# Patient Record
Sex: Female | Born: 1935 | Race: White | Hispanic: No | Smoking: Never smoker
Health system: Southern US, Community
[De-identification: ages and names within clinical notes are randomized; demographics above are authoritative.]

## PROBLEM LIST (undated history)

## (undated) DIAGNOSIS — I2510 Atherosclerotic heart disease of native coronary artery without angina pectoris: Secondary | ICD-10-CM

## (undated) DIAGNOSIS — R0602 Shortness of breath: Secondary | ICD-10-CM

## (undated) DIAGNOSIS — E079 Disorder of thyroid, unspecified: Secondary | ICD-10-CM

## (undated) DIAGNOSIS — Z981 Arthrodesis status: Secondary | ICD-10-CM

## (undated) DIAGNOSIS — I779 Disorder of arteries and arterioles, unspecified: Secondary | ICD-10-CM

## (undated) DIAGNOSIS — D735 Infarction of spleen: Secondary | ICD-10-CM

## (undated) DIAGNOSIS — I4891 Unspecified atrial fibrillation: Secondary | ICD-10-CM

## (undated) DIAGNOSIS — Z9049 Acquired absence of other specified parts of digestive tract: Secondary | ICD-10-CM

## (undated) DIAGNOSIS — R011 Cardiac murmur, unspecified: Secondary | ICD-10-CM

## (undated) DIAGNOSIS — R58 Hemorrhage, not elsewhere classified: Secondary | ICD-10-CM

## (undated) DIAGNOSIS — I10 Essential (primary) hypertension: Secondary | ICD-10-CM

## (undated) DIAGNOSIS — R001 Bradycardia, unspecified: Secondary | ICD-10-CM

## (undated) DIAGNOSIS — D239 Other benign neoplasm of skin, unspecified: Secondary | ICD-10-CM

## (undated) DIAGNOSIS — E782 Mixed hyperlipidemia: Secondary | ICD-10-CM

## (undated) DIAGNOSIS — IMO0002 Reserved for concepts with insufficient information to code with codable children: Secondary | ICD-10-CM

## (undated) DIAGNOSIS — I739 Peripheral vascular disease, unspecified: Secondary | ICD-10-CM

## (undated) DIAGNOSIS — Z90710 Acquired absence of both cervix and uterus: Secondary | ICD-10-CM

## (undated) DIAGNOSIS — K219 Gastro-esophageal reflux disease without esophagitis: Secondary | ICD-10-CM

## (undated) DIAGNOSIS — J449 Chronic obstructive pulmonary disease, unspecified: Secondary | ICD-10-CM

## (undated) DIAGNOSIS — I639 Cerebral infarction, unspecified: Secondary | ICD-10-CM

## (undated) DIAGNOSIS — Z7901 Long term (current) use of anticoagulants: Secondary | ICD-10-CM

## (undated) DIAGNOSIS — K449 Diaphragmatic hernia without obstruction or gangrene: Secondary | ICD-10-CM

## (undated) DIAGNOSIS — I480 Paroxysmal atrial fibrillation: Secondary | ICD-10-CM

## (undated) DIAGNOSIS — M1990 Unspecified osteoarthritis, unspecified site: Secondary | ICD-10-CM

## (undated) HISTORY — PX: CHOLECYSTECTOMY: SHX55

## (undated) HISTORY — PX: BLADDER SUSPENSION: SHX72

## (undated) HISTORY — DX: Disorder of arteries and arterioles, unspecified: I77.9

## (undated) HISTORY — DX: Peripheral vascular disease, unspecified: I73.9

## (undated) HISTORY — DX: Unspecified osteoarthritis, unspecified site: M19.90

## (undated) HISTORY — DX: Cardiac murmur, unspecified: R01.1

## (undated) HISTORY — PX: CORONARY ARTERY BYPASS GRAFT: SHX141

## (undated) HISTORY — PX: CARDIAC CATHETERIZATION: SHX172

## (undated) HISTORY — PX: CATARACT EXTRACTION W/ INTRAOCULAR LENS  IMPLANT, BILATERAL: SHX1307

## (undated) HISTORY — PX: BACK SURGERY: SHX140

## (undated) HISTORY — PX: EYE SURGERY: SHX253

## (undated) HISTORY — DX: Mixed hyperlipidemia: E78.2

## (undated) HISTORY — DX: Disorder of thyroid, unspecified: E07.9

## (undated) SURGERY — ECHOCARDIOGRAM, TRANSESOPHAGEAL
Anesthesia: Moderate Sedation

## (undated) SURGERY — COLONOSCOPY
Anesthesia: Moderate Sedation

---

## 1972-04-12 HISTORY — PX: ABDOMINAL HYSTERECTOMY: SHX81

## 1998-09-26 ENCOUNTER — Ambulatory Visit (HOSPITAL_COMMUNITY): Admission: RE | Admit: 1998-09-26 | Discharge: 1998-09-26 | Payer: Self-pay | Admitting: Cardiovascular Disease

## 1998-09-26 ENCOUNTER — Encounter: Payer: Self-pay | Admitting: Cardiovascular Disease

## 1998-11-01 ENCOUNTER — Encounter: Payer: Self-pay | Admitting: Emergency Medicine

## 1998-11-01 ENCOUNTER — Emergency Department (HOSPITAL_COMMUNITY): Admission: EM | Admit: 1998-11-01 | Discharge: 1998-11-01 | Payer: Self-pay | Admitting: Emergency Medicine

## 1998-12-04 ENCOUNTER — Inpatient Hospital Stay (HOSPITAL_COMMUNITY): Admission: RE | Admit: 1998-12-04 | Discharge: 1998-12-05 | Payer: Self-pay | Admitting: Cardiovascular Disease

## 1998-12-04 ENCOUNTER — Encounter: Payer: Self-pay | Admitting: Cardiovascular Disease

## 1999-03-09 ENCOUNTER — Encounter: Payer: Self-pay | Admitting: Cardiovascular Disease

## 1999-03-09 ENCOUNTER — Inpatient Hospital Stay (HOSPITAL_COMMUNITY): Admission: EM | Admit: 1999-03-09 | Discharge: 1999-03-11 | Payer: Self-pay | Admitting: Emergency Medicine

## 1999-03-27 ENCOUNTER — Ambulatory Visit (HOSPITAL_COMMUNITY): Admission: RE | Admit: 1999-03-27 | Discharge: 1999-03-27 | Payer: Self-pay | Admitting: Cardiovascular Disease

## 1999-03-27 ENCOUNTER — Encounter: Payer: Self-pay | Admitting: Cardiovascular Disease

## 1999-05-22 ENCOUNTER — Encounter: Payer: Self-pay | Admitting: Gastroenterology

## 1999-05-22 ENCOUNTER — Ambulatory Visit (HOSPITAL_COMMUNITY): Admission: RE | Admit: 1999-05-22 | Discharge: 1999-05-22 | Payer: Self-pay | Admitting: Gastroenterology

## 2000-02-09 ENCOUNTER — Encounter: Admission: RE | Admit: 2000-02-09 | Discharge: 2000-02-09 | Payer: Self-pay | Admitting: Gastroenterology

## 2000-02-09 ENCOUNTER — Encounter: Payer: Self-pay | Admitting: Gastroenterology

## 2000-06-27 ENCOUNTER — Ambulatory Visit (HOSPITAL_COMMUNITY): Admission: RE | Admit: 2000-06-27 | Discharge: 2000-06-27 | Payer: Self-pay | Admitting: Gastroenterology

## 2000-10-05 ENCOUNTER — Other Ambulatory Visit: Admission: RE | Admit: 2000-10-05 | Discharge: 2000-10-05 | Payer: Self-pay | Admitting: *Deleted

## 2001-03-21 ENCOUNTER — Ambulatory Visit (HOSPITAL_COMMUNITY): Admission: RE | Admit: 2001-03-21 | Discharge: 2001-03-21 | Payer: Self-pay | Admitting: Ophthalmology

## 2001-04-11 ENCOUNTER — Ambulatory Visit (HOSPITAL_COMMUNITY): Admission: RE | Admit: 2001-04-11 | Discharge: 2001-04-11 | Payer: Self-pay | Admitting: Ophthalmology

## 2001-05-31 ENCOUNTER — Ambulatory Visit (HOSPITAL_COMMUNITY): Admission: RE | Admit: 2001-05-31 | Discharge: 2001-05-31 | Payer: Self-pay | Admitting: Family Medicine

## 2001-05-31 ENCOUNTER — Encounter: Payer: Self-pay | Admitting: Family Medicine

## 2001-06-09 ENCOUNTER — Ambulatory Visit (HOSPITAL_COMMUNITY): Admission: RE | Admit: 2001-06-09 | Discharge: 2001-06-09 | Payer: Self-pay | Admitting: Family Medicine

## 2001-06-09 ENCOUNTER — Encounter: Payer: Self-pay | Admitting: Family Medicine

## 2001-06-13 ENCOUNTER — Inpatient Hospital Stay (HOSPITAL_COMMUNITY): Admission: AD | Admit: 2001-06-13 | Discharge: 2001-06-16 | Payer: Self-pay | Admitting: Family Medicine

## 2001-06-13 ENCOUNTER — Encounter: Payer: Self-pay | Admitting: Family Medicine

## 2001-10-04 ENCOUNTER — Ambulatory Visit (HOSPITAL_COMMUNITY): Admission: RE | Admit: 2001-10-04 | Discharge: 2001-10-04 | Payer: Self-pay | Admitting: Family Medicine

## 2001-10-04 ENCOUNTER — Encounter: Payer: Self-pay | Admitting: Family Medicine

## 2001-10-09 ENCOUNTER — Ambulatory Visit (HOSPITAL_COMMUNITY): Admission: RE | Admit: 2001-10-09 | Discharge: 2001-10-09 | Payer: Self-pay | Admitting: Family Medicine

## 2001-10-09 ENCOUNTER — Encounter: Payer: Self-pay | Admitting: Family Medicine

## 2001-10-23 ENCOUNTER — Encounter: Payer: Self-pay | Admitting: Family Medicine

## 2001-10-23 ENCOUNTER — Ambulatory Visit (HOSPITAL_COMMUNITY): Admission: RE | Admit: 2001-10-23 | Discharge: 2001-10-23 | Payer: Self-pay | Admitting: Family Medicine

## 2001-11-30 ENCOUNTER — Encounter: Admission: RE | Admit: 2001-11-30 | Discharge: 2001-11-30 | Payer: Self-pay | Admitting: Neurosurgery

## 2001-11-30 ENCOUNTER — Encounter: Payer: Self-pay | Admitting: Neurosurgery

## 2001-12-14 ENCOUNTER — Encounter: Admission: RE | Admit: 2001-12-14 | Discharge: 2001-12-14 | Payer: Self-pay | Admitting: Neurosurgery

## 2001-12-14 ENCOUNTER — Encounter: Payer: Self-pay | Admitting: Neurosurgery

## 2001-12-21 ENCOUNTER — Encounter: Payer: Self-pay | Admitting: Family Medicine

## 2001-12-21 ENCOUNTER — Ambulatory Visit (HOSPITAL_COMMUNITY): Admission: RE | Admit: 2001-12-21 | Discharge: 2001-12-21 | Payer: Self-pay | Admitting: Family Medicine

## 2002-01-01 ENCOUNTER — Encounter: Payer: Self-pay | Admitting: Gastroenterology

## 2002-01-01 ENCOUNTER — Encounter: Admission: RE | Admit: 2002-01-01 | Discharge: 2002-01-01 | Payer: Self-pay | Admitting: Gastroenterology

## 2002-01-08 ENCOUNTER — Encounter: Admission: RE | Admit: 2002-01-08 | Discharge: 2002-01-08 | Payer: Self-pay | Admitting: Gastroenterology

## 2002-01-08 ENCOUNTER — Encounter: Payer: Self-pay | Admitting: Gastroenterology

## 2002-01-10 ENCOUNTER — Encounter: Admission: RE | Admit: 2002-01-10 | Discharge: 2002-01-10 | Payer: Self-pay | Admitting: Gastroenterology

## 2002-01-10 ENCOUNTER — Encounter: Payer: Self-pay | Admitting: Gastroenterology

## 2002-02-15 ENCOUNTER — Encounter: Admission: RE | Admit: 2002-02-15 | Discharge: 2002-02-15 | Payer: Self-pay | Admitting: Neurosurgery

## 2002-02-15 ENCOUNTER — Encounter: Payer: Self-pay | Admitting: Neurosurgery

## 2002-04-16 ENCOUNTER — Ambulatory Visit (HOSPITAL_COMMUNITY): Admission: RE | Admit: 2002-04-16 | Discharge: 2002-04-16 | Payer: Self-pay | Admitting: Family Medicine

## 2002-04-16 ENCOUNTER — Encounter: Payer: Self-pay | Admitting: Family Medicine

## 2002-04-30 ENCOUNTER — Encounter: Payer: Self-pay | Admitting: Emergency Medicine

## 2002-04-30 ENCOUNTER — Inpatient Hospital Stay (HOSPITAL_COMMUNITY): Admission: EM | Admit: 2002-04-30 | Discharge: 2002-05-02 | Payer: Self-pay | Admitting: Emergency Medicine

## 2002-07-04 ENCOUNTER — Encounter: Payer: Self-pay | Admitting: Neurosurgery

## 2002-07-04 ENCOUNTER — Encounter: Admission: RE | Admit: 2002-07-04 | Discharge: 2002-07-04 | Payer: Self-pay | Admitting: Neurosurgery

## 2002-08-30 ENCOUNTER — Encounter: Payer: Self-pay | Admitting: Family Medicine

## 2002-08-30 ENCOUNTER — Ambulatory Visit (HOSPITAL_COMMUNITY): Admission: RE | Admit: 2002-08-30 | Discharge: 2002-08-30 | Payer: Self-pay | Admitting: Family Medicine

## 2002-08-31 ENCOUNTER — Ambulatory Visit (HOSPITAL_COMMUNITY): Admission: RE | Admit: 2002-08-31 | Discharge: 2002-08-31 | Payer: Self-pay | Admitting: Family Medicine

## 2002-08-31 ENCOUNTER — Encounter: Payer: Self-pay | Admitting: Family Medicine

## 2002-10-02 ENCOUNTER — Encounter: Admission: RE | Admit: 2002-10-02 | Discharge: 2002-10-02 | Payer: Self-pay | Admitting: Neurosurgery

## 2002-10-02 ENCOUNTER — Encounter: Payer: Self-pay | Admitting: Neurosurgery

## 2002-10-16 ENCOUNTER — Encounter: Admission: RE | Admit: 2002-10-16 | Discharge: 2002-10-16 | Payer: Self-pay | Admitting: Neurosurgery

## 2002-10-16 ENCOUNTER — Encounter: Payer: Self-pay | Admitting: Neurosurgery

## 2002-10-26 ENCOUNTER — Ambulatory Visit (HOSPITAL_COMMUNITY): Admission: RE | Admit: 2002-10-26 | Discharge: 2002-10-26 | Payer: Self-pay | Admitting: Neurosurgery

## 2002-10-26 ENCOUNTER — Encounter: Payer: Self-pay | Admitting: Neurosurgery

## 2002-11-30 ENCOUNTER — Encounter: Payer: Self-pay | Admitting: Neurosurgery

## 2002-11-30 ENCOUNTER — Inpatient Hospital Stay (HOSPITAL_COMMUNITY): Admission: RE | Admit: 2002-11-30 | Discharge: 2002-12-03 | Payer: Self-pay | Admitting: Neurosurgery

## 2003-03-02 ENCOUNTER — Emergency Department (HOSPITAL_COMMUNITY): Admission: EM | Admit: 2003-03-02 | Discharge: 2003-03-03 | Payer: Self-pay | Admitting: Emergency Medicine

## 2003-04-24 ENCOUNTER — Ambulatory Visit (HOSPITAL_COMMUNITY): Admission: RE | Admit: 2003-04-24 | Discharge: 2003-04-24 | Payer: Self-pay | Admitting: Family Medicine

## 2003-05-15 ENCOUNTER — Ambulatory Visit (HOSPITAL_COMMUNITY): Admission: RE | Admit: 2003-05-15 | Discharge: 2003-05-15 | Payer: Self-pay | Admitting: Family Medicine

## 2003-05-22 ENCOUNTER — Ambulatory Visit (HOSPITAL_COMMUNITY): Admission: RE | Admit: 2003-05-22 | Discharge: 2003-05-22 | Payer: Self-pay | Admitting: Orthopedic Surgery

## 2003-11-04 ENCOUNTER — Encounter: Admission: RE | Admit: 2003-11-04 | Discharge: 2003-11-04 | Payer: Self-pay | Admitting: Neurosurgery

## 2003-12-20 ENCOUNTER — Inpatient Hospital Stay (HOSPITAL_COMMUNITY): Admission: RE | Admit: 2003-12-20 | Discharge: 2003-12-26 | Payer: Self-pay | Admitting: Neurosurgery

## 2004-04-14 ENCOUNTER — Emergency Department (HOSPITAL_COMMUNITY): Admission: EM | Admit: 2004-04-14 | Discharge: 2004-04-14 | Payer: Self-pay | Admitting: Emergency Medicine

## 2004-05-29 ENCOUNTER — Ambulatory Visit (HOSPITAL_COMMUNITY): Admission: RE | Admit: 2004-05-29 | Discharge: 2004-05-29 | Payer: Self-pay | Admitting: Family Medicine

## 2004-06-24 ENCOUNTER — Ambulatory Visit (HOSPITAL_COMMUNITY): Admission: RE | Admit: 2004-06-24 | Discharge: 2004-06-24 | Payer: Self-pay | Admitting: Family Medicine

## 2004-09-24 ENCOUNTER — Ambulatory Visit (HOSPITAL_COMMUNITY): Admission: RE | Admit: 2004-09-24 | Discharge: 2004-09-24 | Payer: Self-pay | Admitting: Family Medicine

## 2004-09-29 ENCOUNTER — Ambulatory Visit (HOSPITAL_COMMUNITY): Admission: RE | Admit: 2004-09-29 | Discharge: 2004-09-29 | Payer: Self-pay | Admitting: Otolaryngology

## 2005-06-25 ENCOUNTER — Ambulatory Visit (HOSPITAL_COMMUNITY): Admission: RE | Admit: 2005-06-25 | Discharge: 2005-06-25 | Payer: Self-pay | Admitting: Family Medicine

## 2005-07-21 ENCOUNTER — Ambulatory Visit (HOSPITAL_COMMUNITY): Admission: RE | Admit: 2005-07-21 | Discharge: 2005-07-21 | Payer: Self-pay | Admitting: Family Medicine

## 2006-05-13 ENCOUNTER — Ambulatory Visit (HOSPITAL_COMMUNITY): Admission: RE | Admit: 2006-05-13 | Discharge: 2006-05-13 | Payer: Self-pay | Admitting: Cardiovascular Disease

## 2006-05-23 ENCOUNTER — Ambulatory Visit (HOSPITAL_COMMUNITY): Admission: RE | Admit: 2006-05-23 | Discharge: 2006-05-23 | Payer: Self-pay | Admitting: Family Medicine

## 2006-05-27 ENCOUNTER — Ambulatory Visit (HOSPITAL_COMMUNITY): Admission: RE | Admit: 2006-05-27 | Discharge: 2006-05-27 | Payer: Self-pay | Admitting: Otolaryngology

## 2006-06-22 ENCOUNTER — Emergency Department (HOSPITAL_COMMUNITY): Admission: EM | Admit: 2006-06-22 | Discharge: 2006-06-22 | Payer: Self-pay | Admitting: Emergency Medicine

## 2006-06-23 ENCOUNTER — Ambulatory Visit: Payer: Self-pay | Admitting: Orthopedic Surgery

## 2006-07-13 ENCOUNTER — Ambulatory Visit: Payer: Self-pay | Admitting: Orthopedic Surgery

## 2006-08-08 ENCOUNTER — Ambulatory Visit: Payer: Self-pay | Admitting: Orthopedic Surgery

## 2006-08-15 ENCOUNTER — Ambulatory Visit (HOSPITAL_COMMUNITY): Admission: RE | Admit: 2006-08-15 | Discharge: 2006-08-15 | Payer: Self-pay | Admitting: Family Medicine

## 2006-09-08 ENCOUNTER — Ambulatory Visit: Payer: Self-pay | Admitting: Orthopedic Surgery

## 2006-10-17 ENCOUNTER — Ambulatory Visit (HOSPITAL_COMMUNITY): Admission: RE | Admit: 2006-10-17 | Discharge: 2006-10-17 | Payer: Self-pay | Admitting: Family Medicine

## 2007-06-19 ENCOUNTER — Ambulatory Visit (HOSPITAL_COMMUNITY): Admission: RE | Admit: 2007-06-19 | Discharge: 2007-06-19 | Payer: Self-pay | Admitting: Family Medicine

## 2007-06-30 ENCOUNTER — Ambulatory Visit (HOSPITAL_COMMUNITY): Admission: RE | Admit: 2007-06-30 | Discharge: 2007-06-30 | Payer: Self-pay | Admitting: Family Medicine

## 2007-07-06 ENCOUNTER — Encounter: Admission: RE | Admit: 2007-07-06 | Discharge: 2007-08-29 | Payer: Self-pay | Admitting: Orthopaedic Surgery

## 2007-08-23 ENCOUNTER — Ambulatory Visit (HOSPITAL_COMMUNITY): Admission: RE | Admit: 2007-08-23 | Discharge: 2007-08-23 | Payer: Self-pay | Admitting: Family Medicine

## 2008-01-10 ENCOUNTER — Emergency Department (HOSPITAL_COMMUNITY): Admission: EM | Admit: 2008-01-10 | Discharge: 2008-01-10 | Payer: Self-pay | Admitting: Emergency Medicine

## 2008-02-20 ENCOUNTER — Ambulatory Visit (HOSPITAL_COMMUNITY): Admission: RE | Admit: 2008-02-20 | Discharge: 2008-02-20 | Payer: Self-pay | Admitting: Gastroenterology

## 2008-06-21 ENCOUNTER — Ambulatory Visit (HOSPITAL_COMMUNITY): Admission: RE | Admit: 2008-06-21 | Discharge: 2008-06-21 | Payer: Self-pay | Admitting: Family Medicine

## 2008-07-29 ENCOUNTER — Encounter: Admission: RE | Admit: 2008-07-29 | Discharge: 2008-07-29 | Payer: Self-pay | Admitting: Gastroenterology

## 2008-08-07 ENCOUNTER — Inpatient Hospital Stay (HOSPITAL_COMMUNITY): Admission: EM | Admit: 2008-08-07 | Discharge: 2008-08-09 | Payer: Self-pay | Admitting: Emergency Medicine

## 2008-08-26 ENCOUNTER — Ambulatory Visit (HOSPITAL_COMMUNITY): Admission: RE | Admit: 2008-08-26 | Discharge: 2008-08-26 | Payer: Self-pay | Admitting: Family Medicine

## 2009-03-25 ENCOUNTER — Ambulatory Visit (HOSPITAL_COMMUNITY): Admission: RE | Admit: 2009-03-25 | Discharge: 2009-03-25 | Payer: Self-pay | Admitting: Gastroenterology

## 2009-04-14 ENCOUNTER — Inpatient Hospital Stay (HOSPITAL_COMMUNITY): Admission: EM | Admit: 2009-04-14 | Discharge: 2009-04-17 | Payer: Self-pay | Admitting: Emergency Medicine

## 2009-10-31 ENCOUNTER — Ambulatory Visit (HOSPITAL_COMMUNITY): Admission: RE | Admit: 2009-10-31 | Discharge: 2009-10-31 | Payer: Self-pay | Admitting: Family Medicine

## 2009-11-20 ENCOUNTER — Ambulatory Visit (HOSPITAL_COMMUNITY): Admission: RE | Admit: 2009-11-20 | Discharge: 2009-11-20 | Payer: Self-pay | Admitting: Family Medicine

## 2010-02-22 ENCOUNTER — Emergency Department (HOSPITAL_COMMUNITY)
Admission: EM | Admit: 2010-02-22 | Discharge: 2010-02-22 | Payer: Self-pay | Source: Home / Self Care | Admitting: Emergency Medicine

## 2010-02-28 ENCOUNTER — Inpatient Hospital Stay (HOSPITAL_COMMUNITY)
Admission: EM | Admit: 2010-02-28 | Discharge: 2010-03-04 | Payer: Self-pay | Source: Home / Self Care | Admitting: Emergency Medicine

## 2010-03-12 ENCOUNTER — Encounter
Admission: RE | Admit: 2010-03-12 | Discharge: 2010-04-09 | Payer: Self-pay | Source: Home / Self Care | Attending: Nurse Practitioner | Admitting: Nurse Practitioner

## 2010-04-16 ENCOUNTER — Encounter
Admission: RE | Admit: 2010-04-16 | Discharge: 2010-05-12 | Payer: Self-pay | Source: Home / Self Care | Attending: Nurse Practitioner | Admitting: Nurse Practitioner

## 2010-05-03 ENCOUNTER — Encounter: Payer: Self-pay | Admitting: Family Medicine

## 2010-05-14 ENCOUNTER — Ambulatory Visit: Payer: Medicare Other | Attending: Nurse Practitioner | Admitting: Physical Therapy

## 2010-05-14 DIAGNOSIS — IMO0001 Reserved for inherently not codable concepts without codable children: Secondary | ICD-10-CM | POA: Insufficient documentation

## 2010-05-14 DIAGNOSIS — M6281 Muscle weakness (generalized): Secondary | ICD-10-CM | POA: Insufficient documentation

## 2010-05-14 DIAGNOSIS — R5381 Other malaise: Secondary | ICD-10-CM | POA: Insufficient documentation

## 2010-06-19 IMAGING — CR DG ABDOMEN 1V
1 series · 1 of 1 positions shown · non-contrast
Comparison: 06/21/2008.

CLINICAL DATA: Abdominal pain with constipation.

ABDOMEN - 1 VIEW

[t abdomen supine]
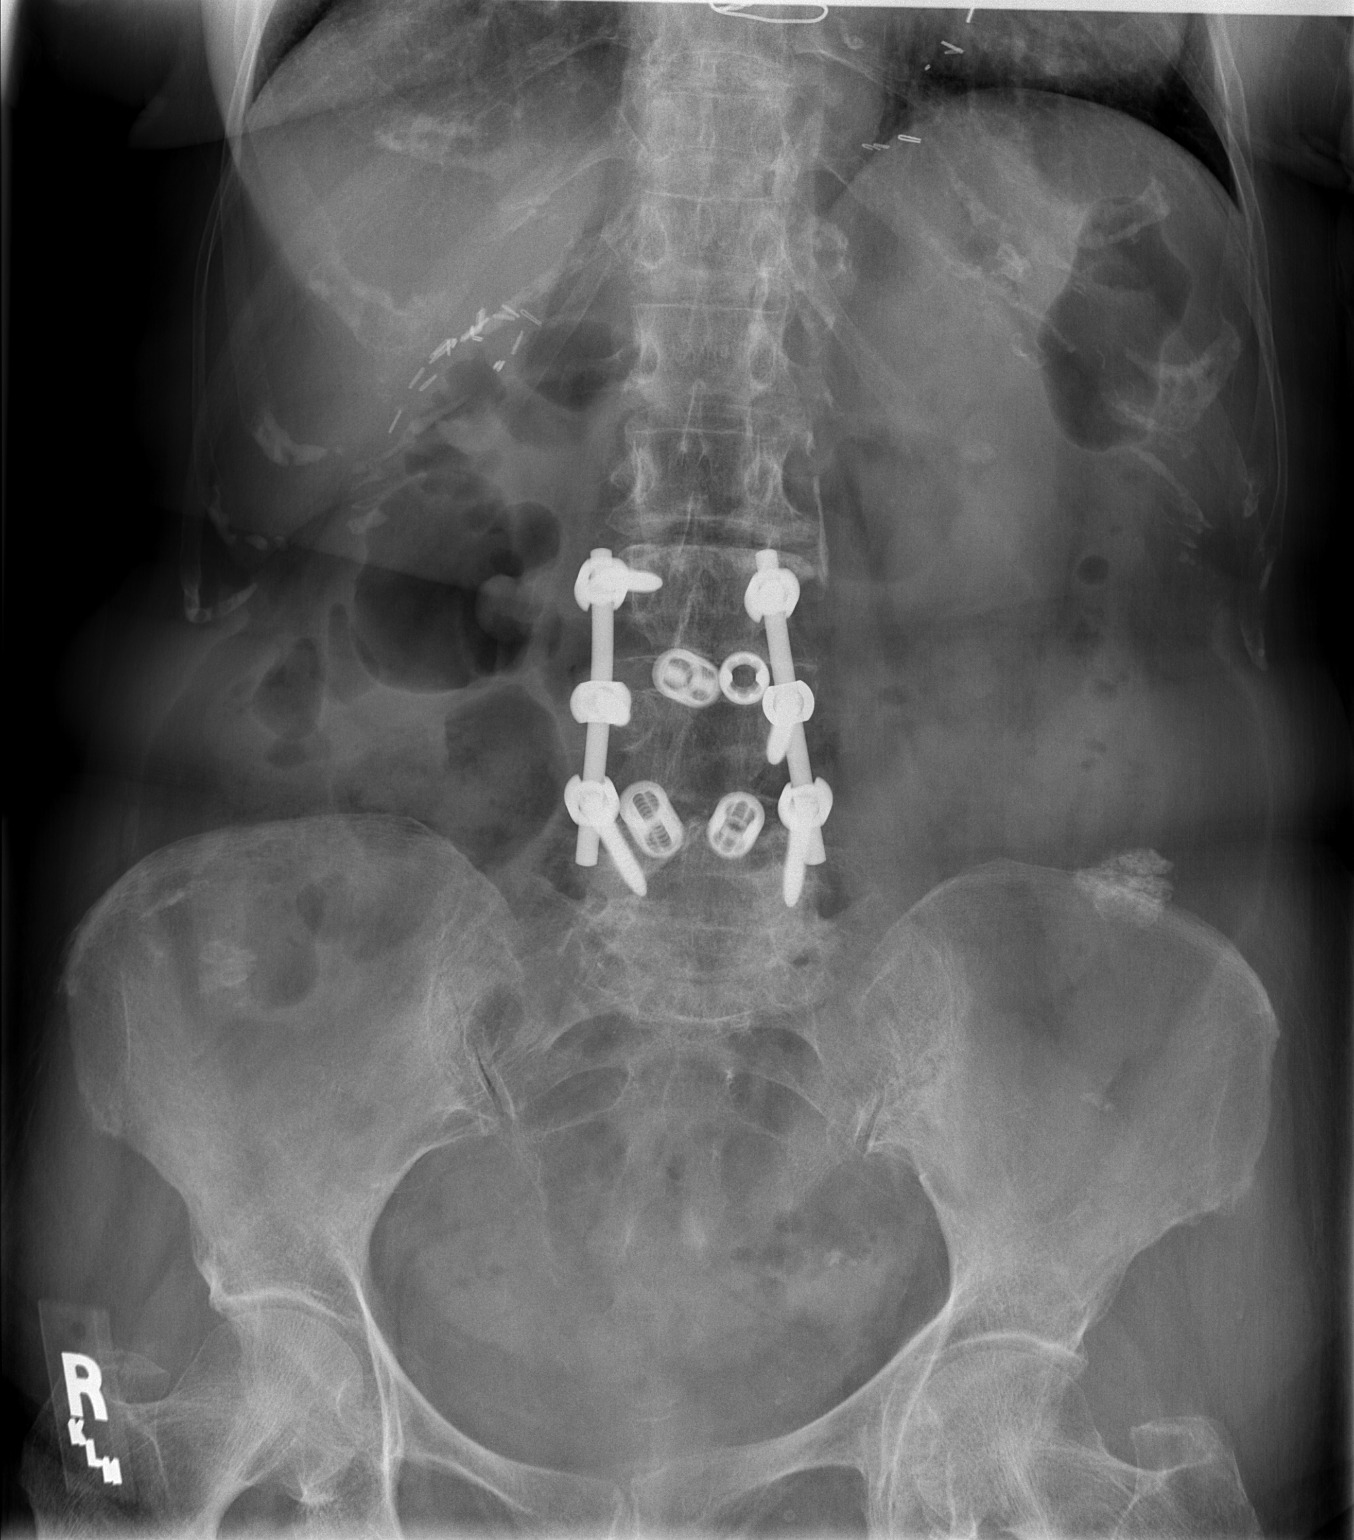

[1 of 1 positions shown; findings below may reference images not displayed]

FINDINGS: Bowel gas pattern is normal.  There is no dilated bowel.
The patient does not appear to be constipated.  Clips are present
in the gallbladder fossa.  There are  no renal calculi.  There are
soft tissue calcification in the buttocks bilaterally.  Pedicle
screw and ray cage fusion at L3-4 and  L4-5 is noted.
IMPRESSION: No acute abnormality.

## 2010-06-24 LAB — COMPREHENSIVE METABOLIC PANEL
Alkaline Phosphatase: 70 U/L (ref 39–117)
BUN: 10 mg/dL (ref 6–23)
CO2: 30 mEq/L (ref 19–32)
Chloride: 103 mEq/L (ref 96–112)
Creatinine, Ser: 0.7 mg/dL (ref 0.4–1.2)
GFR calc non Af Amer: >60 mL/min (ref >60)
Glucose, Bld: 106 mg/dL — ABNORMAL HIGH (ref 70–99)
Total Bilirubin: 0.6 mg/dL (ref 0.3–1.2)

## 2010-06-24 LAB — URINE MICROSCOPIC-ADD ON

## 2010-06-24 LAB — PROTIME-INR
INR: 0.91 (ref 0.00–1.49)
INR: 0.92 (ref 0.00–1.49)
INR: 1 (ref 0.00–1.49)
Prothrombin Time: 12.5 seconds (ref 11.6–15.2)
Prothrombin Time: 12.6 seconds (ref 11.6–15.2)
Prothrombin Time: 13.4 seconds (ref 11.6–15.2)

## 2010-06-24 LAB — GLUCOSE, CAPILLARY
Glucose-Capillary: 101 mg/dL — ABNORMAL HIGH (ref 70–99)
Glucose-Capillary: 108 mg/dL — ABNORMAL HIGH (ref 70–99)
Glucose-Capillary: 124 mg/dL — ABNORMAL HIGH (ref 70–99)
Glucose-Capillary: 131 mg/dL — ABNORMAL HIGH (ref 70–99)
Glucose-Capillary: 147 mg/dL — ABNORMAL HIGH (ref 70–99)
Glucose-Capillary: 87 mg/dL (ref 70–99)
Glucose-Capillary: 99 mg/dL (ref 70–99)

## 2010-06-24 LAB — CBC
HCT: 44.9 % (ref 36.0–46.0)
MCH: 28.9 pg (ref 26.0–34.0)
MCHC: 33 g/dL (ref 30.0–36.0)
MCV: 86.2 fL (ref 78.0–100.0)
Platelets: 246 10*3/uL (ref 150–400)
Platelets: 274 10*3/uL (ref 150–400)
RBC: 4.78 MIL/uL (ref 3.87–5.11)
RDW: 12.8 % (ref 11.5–15.5)
RDW: 12.9 % (ref 11.5–15.5)
WBC: 7 10*3/uL (ref 4.0–10.5)
WBC: 7 10*3/uL (ref 4.0–10.5)

## 2010-06-24 LAB — HEPARIN LEVEL (UNFRACTIONATED)
Heparin Unfractionated: 0.18 IU/mL — ABNORMAL LOW (ref 0.30–0.70)
Heparin Unfractionated: 0.23 IU/mL — ABNORMAL LOW (ref 0.30–0.70)

## 2010-06-24 LAB — URINALYSIS, ROUTINE W REFLEX MICROSCOPIC
Glucose, UA: NEGATIVE mg/dL
Hgb urine dipstick: NEGATIVE
Ketones, ur: NEGATIVE mg/dL
Nitrite: NEGATIVE
Protein, ur: NEGATIVE mg/dL
pH: 7.5 (ref 5.0–8.0)

## 2010-06-24 LAB — POCT I-STAT, CHEM 8
Calcium, Ion: 1.13 mmol/L (ref 1.12–1.32)
Glucose, Bld: 107 mg/dL — ABNORMAL HIGH (ref 70–99)
HCT: 45 % (ref 36.0–46.0)
TCO2: 31 mmol/L (ref 0–100)

## 2010-06-24 LAB — HEMOGLOBIN A1C: Hgb A1c MFr Bld: 5.9 % — ABNORMAL HIGH (ref <5.7)

## 2010-06-24 LAB — APTT: aPTT: 29 seconds (ref 24–37)

## 2010-06-24 LAB — CARDIAC PANEL(CRET KIN+CKTOT+MB+TROPI)
CK, MB: 3.2 ng/mL (ref 0.3–4.0)
Relative Index: INVALID (ref 0.0–2.5)
Total CK: 41 U/L (ref 7–177)
Troponin I: 0.09 ng/mL — ABNORMAL HIGH (ref 0.00–0.06)
Troponin I: 0.14 ng/mL — ABNORMAL HIGH (ref 0.00–0.06)

## 2010-06-24 LAB — CK TOTAL AND CKMB (NOT AT ARMC): Relative Index: INVALID (ref 0.0–2.5)

## 2010-06-24 LAB — LIPID PANEL
LDL Cholesterol: 107 mg/dL — ABNORMAL HIGH (ref 0–99)
Total CHOL/HDL Ratio: 3.9 RATIO
VLDL: 37 mg/dL (ref 0–40)

## 2010-06-28 LAB — CBC
HCT: 41.2 % (ref 36.0–46.0)
Hemoglobin: 13.8 g/dL (ref 12.0–15.0)
Hemoglobin: 14.2 g/dL (ref 12.0–15.0)
MCHC: 34.4 g/dL (ref 30.0–36.0)
MCHC: 34.7 g/dL (ref 30.0–36.0)
MCV: 89.1 fL (ref 78.0–100.0)
MCV: 90.1 fL (ref 78.0–100.0)
Platelets: 194 K/uL (ref 150–400)
RBC: 4.47 MIL/uL (ref 3.87–5.11)
RBC: 4.57 MIL/uL (ref 3.87–5.11)
RDW: 13 % (ref 11.5–15.5)
WBC: 4.2 K/uL (ref 4.0–10.5)
WBC: 5.7 10*3/uL (ref 4.0–10.5)

## 2010-06-28 LAB — DIFFERENTIAL
Basophils Absolute: 0 K/uL (ref 0.0–0.1)
Basophils Relative: 0 % (ref 0–1)
Eosinophils Absolute: 0 K/uL (ref 0.0–0.7)
Eosinophils Relative: 1 % (ref 0–5)
Lymphocytes Relative: 26 % (ref 12–46)
Lymphs Abs: 1.1 10*3/uL (ref 0.7–4.0)
Monocytes Absolute: 0.3 10*3/uL (ref 0.1–1.0)
Monocytes Relative: 8 % (ref 3–12)
Neutro Abs: 2.7 10*3/uL (ref 1.7–7.7)
Neutrophils Relative %: 65 % (ref 43–77)

## 2010-06-28 LAB — BASIC METABOLIC PANEL
BUN: 7 mg/dL (ref 6–23)
CO2: 26 mEq/L (ref 19–32)
Chloride: 103 mEq/L (ref 96–112)
Creatinine, Ser: 0.52 mg/dL (ref 0.4–1.2)
GFR calc Af Amer: >60 mL/min (ref >60)

## 2010-06-28 LAB — LIPID PANEL
Cholesterol: 207 mg/dL — ABNORMAL HIGH (ref 0–200)
HDL: 57 mg/dL (ref >39)
Triglycerides: 166 mg/dL — ABNORMAL HIGH (ref <150)

## 2010-06-28 LAB — COMPREHENSIVE METABOLIC PANEL
ALT: 17 U/L (ref 0–35)
ALT: 19 U/L (ref 0–35)
AST: 22 U/L (ref 0–37)
AST: 23 U/L (ref 0–37)
Albumin: 3.8 g/dL (ref 3.5–5.2)
CO2: 27 mEq/L (ref 19–32)
CO2: 27 mEq/L (ref 19–32)
Calcium: 9.2 mg/dL (ref 8.4–10.5)
Calcium: 9.5 mg/dL (ref 8.4–10.5)
Chloride: 103 mEq/L (ref 96–112)
Creatinine, Ser: 0.62 mg/dL (ref 0.4–1.2)
Creatinine, Ser: 0.75 mg/dL (ref 0.4–1.2)
GFR calc Af Amer: >60 mL/min (ref >60)
GFR calc non Af Amer: >60 mL/min (ref >60)
Glucose, Bld: 92 mg/dL (ref 70–99)
Sodium: 132 mEq/L — ABNORMAL LOW (ref 135–145)
Total Bilirubin: 0.9 mg/dL (ref 0.3–1.2)
Total Protein: 6.5 g/dL (ref 6.0–8.3)

## 2010-06-28 LAB — BASIC METABOLIC PANEL WITH GFR
Calcium: 9.4 mg/dL (ref 8.4–10.5)
GFR calc non Af Amer: >60 mL/min (ref >60)
Glucose, Bld: 93 mg/dL (ref 70–99)
Potassium: 3.8 meq/L (ref 3.5–5.1)
Sodium: 136 meq/L (ref 135–145)

## 2010-06-28 LAB — CK TOTAL AND CKMB (NOT AT ARMC)
CK, MB: 2.1 ng/mL (ref 0.3–4.0)
Relative Index: INVALID (ref 0.0–2.5)
Total CK: 82 U/L (ref 7–177)

## 2010-06-28 LAB — HEPATIC FUNCTION PANEL
AST: 22 U/L (ref 0–37)
Albumin: 3.6 g/dL (ref 3.5–5.2)
Total Protein: 6.1 g/dL (ref 6.0–8.3)

## 2010-06-28 LAB — PROTIME-INR
INR: 0.89 (ref 0.00–1.49)
Prothrombin Time: 12 s (ref 11.6–15.2)

## 2010-06-28 LAB — CARDIAC PANEL(CRET KIN+CKTOT+MB+TROPI)
CK, MB: 2.3 ng/mL (ref 0.3–4.0)
Relative Index: INVALID (ref 0.0–2.5)
Relative Index: INVALID (ref 0.0–2.5)
Total CK: 80 U/L (ref 7–177)
Troponin I: 0.03 ng/mL (ref 0.00–0.06)
Troponin I: 0.04 ng/mL (ref 0.00–0.06)

## 2010-06-28 LAB — HEMOGLOBIN A1C
Hgb A1c MFr Bld: 6.2 % — ABNORMAL HIGH (ref 4.6–6.1)
Mean Plasma Glucose: 131 mg/dL

## 2010-06-28 LAB — APTT: aPTT: 26 seconds (ref 24–37)

## 2010-06-28 LAB — TSH: TSH: 2.108 u[IU]/mL (ref 0.350–4.500)

## 2010-06-28 LAB — TROPONIN I: Troponin I: 0.04 ng/mL (ref 0.00–0.06)

## 2010-07-06 ENCOUNTER — Ambulatory Visit (HOSPITAL_COMMUNITY)
Admission: RE | Admit: 2010-07-06 | Discharge: 2010-07-06 | Disposition: A | Discharge status: Home / Self Care | Payer: Medicare Other | Source: Ambulatory Visit | Attending: Cardiovascular Disease | Admitting: Cardiovascular Disease

## 2010-07-06 ENCOUNTER — Other Ambulatory Visit: Payer: Self-pay | Admitting: Cardiology

## 2010-07-06 DIAGNOSIS — I4891 Unspecified atrial fibrillation: Secondary | ICD-10-CM | POA: Insufficient documentation

## 2010-07-07 ENCOUNTER — Inpatient Hospital Stay (HOSPITAL_COMMUNITY)
Admission: EM | Admit: 2010-07-07 | Discharge: 2010-07-13 | DRG: 287 | Disposition: A | Discharge status: Home / Self Care | Payer: Medicare Other | Attending: Cardiovascular Disease | Admitting: Cardiovascular Disease

## 2010-07-07 ENCOUNTER — Emergency Department (HOSPITAL_COMMUNITY): Payer: Medicare Other

## 2010-07-07 DIAGNOSIS — I10 Essential (primary) hypertension: Secondary | ICD-10-CM | POA: Diagnosis present

## 2010-07-07 DIAGNOSIS — I4891 Unspecified atrial fibrillation: Secondary | ICD-10-CM | POA: Diagnosis present

## 2010-07-07 DIAGNOSIS — T462X5A Adverse effect of other antidysrhythmic drugs, initial encounter: Secondary | ICD-10-CM | POA: Diagnosis present

## 2010-07-07 DIAGNOSIS — I498 Other specified cardiac arrhythmias: Secondary | ICD-10-CM | POA: Diagnosis present

## 2010-07-07 DIAGNOSIS — I2510 Atherosclerotic heart disease of native coronary artery without angina pectoris: Secondary | ICD-10-CM | POA: Diagnosis present

## 2010-07-07 DIAGNOSIS — I059 Rheumatic mitral valve disease, unspecified: Secondary | ICD-10-CM | POA: Diagnosis present

## 2010-07-07 DIAGNOSIS — E785 Hyperlipidemia, unspecified: Secondary | ICD-10-CM | POA: Diagnosis present

## 2010-07-07 DIAGNOSIS — Z7982 Long term (current) use of aspirin: Secondary | ICD-10-CM

## 2010-07-07 DIAGNOSIS — Z79899 Other long term (current) drug therapy: Secondary | ICD-10-CM

## 2010-07-07 DIAGNOSIS — Z951 Presence of aortocoronary bypass graft: Secondary | ICD-10-CM

## 2010-07-07 DIAGNOSIS — R0789 Other chest pain: Principal | ICD-10-CM | POA: Diagnosis present

## 2010-07-07 DIAGNOSIS — Z8673 Personal history of transient ischemic attack (TIA), and cerebral infarction without residual deficits: Secondary | ICD-10-CM

## 2010-07-07 DIAGNOSIS — I5189 Other ill-defined heart diseases: Secondary | ICD-10-CM | POA: Diagnosis present

## 2010-07-07 LAB — COMPREHENSIVE METABOLIC PANEL
AST: 26 U/L (ref 0–37)
Albumin: 3.7 g/dL (ref 3.5–5.2)
Alkaline Phosphatase: 57 U/L (ref 39–117)
Chloride: 106 mEq/L (ref 96–112)
GFR calc Af Amer: >60 mL/min (ref >60)
Potassium: 3.8 mEq/L (ref 3.5–5.1)
Total Bilirubin: 0.8 mg/dL (ref 0.3–1.2)
Total Protein: 6.4 g/dL (ref 6.0–8.3)

## 2010-07-07 LAB — DIFFERENTIAL
Basophils Absolute: 0 10*3/uL (ref 0.0–0.1)
Basophils Absolute: 0 10*3/uL (ref 0.0–0.1)
Basophils Relative: 1 % (ref 0–1)
Basophils Relative: 1 % (ref 0–1)
Eosinophils Absolute: 0 10*3/uL (ref 0.0–0.7)
Eosinophils Relative: 1 % (ref 0–5)
Lymphocytes Relative: 19 % (ref 12–46)
Monocytes Absolute: 0.5 10*3/uL (ref 0.1–1.0)
Neutrophils Relative %: 67 % (ref 43–77)

## 2010-07-07 LAB — CBC
HCT: 41.2 % (ref 36.0–46.0)
MCHC: 34 g/dL (ref 30.0–36.0)
Platelets: 184 10*3/uL (ref 150–400)
RBC: 4.75 MIL/uL (ref 3.87–5.11)
RDW: 13.3 % (ref 11.5–15.5)
RDW: 13.2 % (ref 11.5–15.5)
WBC: 5.9 10*3/uL (ref 4.0–10.5)

## 2010-07-07 LAB — PROTIME-INR
INR: 1.48 (ref 0.00–1.49)
INR: 1.7 — ABNORMAL HIGH (ref 0.00–1.49)
Prothrombin Time: 20.2 seconds — ABNORMAL HIGH (ref 11.6–15.2)

## 2010-07-07 LAB — APTT: aPTT: 61 seconds — ABNORMAL HIGH (ref 24–37)

## 2010-07-07 LAB — URINALYSIS, ROUTINE W REFLEX MICROSCOPIC
Bilirubin Urine: NEGATIVE
Ketones, ur: NEGATIVE mg/dL
Nitrite: NEGATIVE
Specific Gravity, Urine: 1.023 (ref 1.005–1.030)
Urobilinogen, UA: 0.2 mg/dL (ref 0.0–1.0)
pH: 7.5 (ref 5.0–8.0)

## 2010-07-07 LAB — BASIC METABOLIC PANEL
GFR calc Af Amer: >60 mL/min (ref >60)
GFR calc non Af Amer: >60 mL/min (ref >60)
Glucose, Bld: 144 mg/dL — ABNORMAL HIGH (ref 70–99)
Potassium: 3.6 mEq/L (ref 3.5–5.1)
Sodium: 138 mEq/L (ref 135–145)

## 2010-07-07 LAB — CK TOTAL AND CKMB (NOT AT ARMC): CK, MB: 2.5 ng/mL (ref 0.3–4.0)

## 2010-07-07 LAB — CARDIAC PANEL(CRET KIN+CKTOT+MB+TROPI)
CK, MB: 1.9 ng/mL (ref 0.3–4.0)
Relative Index: INVALID (ref 0.0–2.5)
Troponin I: 0.02 ng/mL (ref 0.00–0.06)

## 2010-07-07 LAB — URINE MICROSCOPIC-ADD ON

## 2010-07-07 LAB — POCT CARDIAC MARKERS: CKMB, poc: <1 ng/mL — ABNORMAL LOW (ref 1.0–8.0)

## 2010-07-07 LAB — TROPONIN I: Troponin I: <0.01 ng/mL (ref 0.00–0.06)

## 2010-07-07 LAB — BRAIN NATRIURETIC PEPTIDE: Pro B Natriuretic peptide (BNP): 320 pg/mL — ABNORMAL HIGH (ref 0.0–100.0)

## 2010-07-07 MED ORDER — IOHEXOL 300 MG/ML  SOLN
100.0000 mL | Freq: Once | INTRAMUSCULAR | Status: AC | PRN
  Administered 2010-07-07: 100 mL via INTRAVENOUS

## 2010-07-08 LAB — CBC
MCV: 85.5 fL (ref 78.0–100.0)
Platelets: 194 10*3/uL (ref 150–400)
RBC: 4.63 MIL/uL (ref 3.87–5.11)
RDW: 13.4 % (ref 11.5–15.5)
WBC: 7 10*3/uL (ref 4.0–10.5)

## 2010-07-08 LAB — BASIC METABOLIC PANEL
BUN: 15 mg/dL (ref 6–23)
Calcium: 9.3 mg/dL (ref 8.4–10.5)
Chloride: 103 mEq/L (ref 96–112)
Creatinine, Ser: 0.77 mg/dL (ref 0.4–1.2)
GFR calc Af Amer: >60 mL/min (ref >60)
GFR calc non Af Amer: >60 mL/min (ref >60)

## 2010-07-08 LAB — URINE CULTURE
Colony Count: 15000
Culture  Setup Time: 201203272103

## 2010-07-08 LAB — CARDIAC PANEL(CRET KIN+CKTOT+MB+TROPI)
CK, MB: 1.7 ng/mL (ref 0.3–4.0)
Total CK: 36 U/L (ref 7–177)

## 2010-07-08 LAB — HEPARIN LEVEL (UNFRACTIONATED): Heparin Unfractionated: 0.67 IU/mL (ref 0.30–0.70)

## 2010-07-08 LAB — PROTIME-INR
INR: 1.27 (ref 0.00–1.49)
Prothrombin Time: 16.1 seconds — ABNORMAL HIGH (ref 11.6–15.2)

## 2010-07-08 LAB — POCT ACTIVATED CLOTTING TIME
Activated Clotting Time: 175 seconds
Activated Clotting Time: 187 seconds

## 2010-07-09 LAB — CBC
HCT: 36.9 % (ref 36.0–46.0)
Platelets: 178 10*3/uL (ref 150–400)
RDW: 13.5 % (ref 11.5–15.5)
WBC: 5.3 10*3/uL (ref 4.0–10.5)

## 2010-07-09 LAB — BASIC METABOLIC PANEL
BUN: 16 mg/dL (ref 6–23)
Calcium: 9.3 mg/dL (ref 8.4–10.5)
Creatinine, Ser: 0.74 mg/dL (ref 0.4–1.2)
GFR calc non Af Amer: >60 mL/min (ref >60)

## 2010-07-09 LAB — PROTIME-INR
INR: 1.15 (ref 0.00–1.49)
Prothrombin Time: 14.9 seconds (ref 11.6–15.2)

## 2010-07-10 LAB — HEPARIN LEVEL (UNFRACTIONATED): Heparin Unfractionated: 0.9 IU/mL — ABNORMAL HIGH (ref 0.30–0.70)

## 2010-07-10 LAB — CBC
Platelets: 186 10*3/uL (ref 150–400)
RDW: 13.2 % (ref 11.5–15.5)
WBC: 5.5 10*3/uL (ref 4.0–10.5)

## 2010-07-10 LAB — PROTIME-INR: INR: 1.18 (ref 0.00–1.49)

## 2010-07-11 LAB — BASIC METABOLIC PANEL
CO2: 26 mEq/L (ref 19–32)
Chloride: 103 mEq/L (ref 96–112)
Creatinine, Ser: 0.75 mg/dL (ref 0.4–1.2)
GFR calc Af Amer: >60 mL/min (ref >60)

## 2010-07-11 LAB — CBC
MCH: 29.3 pg (ref 26.0–34.0)
MCV: 85.8 fL (ref 78.0–100.0)
Platelets: 187 10*3/uL (ref 150–400)
RBC: 4.43 MIL/uL (ref 3.87–5.11)
RDW: 13.1 % (ref 11.5–15.5)

## 2010-07-11 LAB — HEPARIN LEVEL (UNFRACTIONATED): Heparin Unfractionated: 0.43 IU/mL (ref 0.30–0.70)

## 2010-07-12 LAB — PROTIME-INR: INR: 2.46 — ABNORMAL HIGH (ref 0.00–1.49)

## 2010-07-12 LAB — CBC
HCT: 37.4 % (ref 36.0–46.0)
Hemoglobin: 12.4 g/dL (ref 12.0–15.0)
MCHC: 33.2 g/dL (ref 30.0–36.0)

## 2010-07-12 LAB — HEPARIN LEVEL (UNFRACTIONATED): Heparin Unfractionated: 0.51 IU/mL (ref 0.30–0.70)

## 2010-07-13 LAB — CBC
HCT: 37.5 % (ref 36.0–46.0)
Hemoglobin: 12.6 g/dL (ref 12.0–15.0)
MCHC: 33.6 g/dL (ref 30.0–36.0)
WBC: 6.5 10*3/uL (ref 4.0–10.5)

## 2010-07-13 LAB — PROTIME-INR
INR: 3 — ABNORMAL HIGH (ref 0.00–1.49)
Prothrombin Time: 31.2 seconds — ABNORMAL HIGH (ref 11.6–15.2)

## 2010-07-21 NOTE — Discharge Summary (Signed)
Cathy Ortega, Cathy Ortega NO.:  [id number]  MEDICAL RECORD NO.:  [id number]           PATIENT TYPE:  I  LOCATION:  2502                         FACILITY:  [facility]  PHYSICIAN:  Nanetta Batty, M.D.   DATE OF BIRTH:  Jun 30, 1922  DATE OF ADMISSION:  07/07/2010 DATE OF DISCHARGE:  07/13/2010                              DISCHARGE SUMMARY   DISCHARGE DIAGNOSES: 1. Chest pain associated with shortness of breath, chronic and     improved.     a.     Stable coronary artery grafts by cardiac catheterization      this admission.     b.     Negative CT angioof chest for PE.     c.     2+ plus mitral regurg on cardiac cath.     d.     Normal left ventricular function on cardiac cath. 2. Atrial fibrillation rate controlled and anticoagulation now with     Coumadin. 3. Left atrial thrombus secondary to atrial fibrillation on Coumadin. 4. Bradycardia associated with amiodarone. 5. Coronary artery disease status post bypass grafting. 6. History of cerebrovascular accident. 7. Bradycardia resolved after discontinuing amiodarone.  DISCHARGE CONDITION:  Improved.  PROCEDURES:  July 08, 2010, combined left heart cath by Dr. Nanetta Batty with graft visualization with results as stated with patent stable grafts and normal LV function, 2+ MR.  DISCHARGE MEDICATIONS:  See medication reconciliation sheet.  DISCHARGE INSTRUCTIONS: 1. Activity.  Increase activity slowly.  May shower.  No lifting for 2     days. 2. Low-sodium heart-healthy diet. 3. Wash cath site with soap and water.  Call if any bleeding,     swelling, or drainage. 4. Follow up with Dr. Allyson Sabal in [place] on July 24, 2010, at 10:30     a.m. 5. Go to the [facility] in [place] on Thursday, July 16, 2010,     at 11:40 in the morning.  HOSPITAL COURSE:  Cathy Ortega was admitted on July 07, 2010, after presenting to the ER with anxiety of her blood clot in her heart, which was diagnosed with TEE on July 06, 2010.  Also she had shortness of breath and chest pressure and extreme weakness at rest and with exertion.  The weakness could be related to her bradycardia.  At times in the ER, heart rate would be in the 40s while she was talking.  She was on amiodarone, which had been added because of her AFib due to the bradycardia and due to the fact we did not want her to convert to sinus rhythm at this point due to the thrombus, we discontinued the amiodarone.  She was admitted to rule out MI.  The patient complained of progressive weakness, dyspnea on exertion, and short of breath at rest.  She was on Pradaxa on admission.  We discontinued her Pradaxa and started Coumadin with heparin crossover.  She continued to be stable.  Cardiac enzymes were negative.  Underwent cardiac cath as stated.  Then we began Coumadin heparin crossover.  She continued with episodic episodes of chest pain.  We also had done a  CT angio to rule out PE with her shortness of breath, which was negative for pulmonary illness.  She continued to improve.  We did have her walk in the hall with cardiac rehab and she did develop her chest pain, pressure midway through the walk, and every time she walked she does develop chest pain, but today July 13, 2010, with her chest pain, she stated that this was chronic and she always has chest pressure when she walked and Dr. Allyson Sabal was aware of that, but her bradycardia has resolved off the amiodarone and we also decreased her Toprol to only once daily.  Her INR is therapeutic and we will monitor her closely as an outpatient.     Cathy Ortega. [name], N.P.   ______________________________ Nanetta Batty, M.D.    LRI/MEDQ  D:  07/13/2010  T:  07/14/2010  Job:  [id number]  cc:   Cathy Ortega, M.D.  Electronically Signed by Nada Boozer N.P. on 07/14/2010 03:10:03 PM Electronically Signed by Nanetta Batty M.D. on 07/21/2010 10:45:27 AM

## 2010-07-21 NOTE — Procedures (Signed)
NAMEMarland Kitchen  Cathy, Ortega NO.:  [id number]  MEDICAL RECORD NO.:  [id number]           PATIENT TYPE:  LOCATION:                                 FACILITY:  PHYSICIAN:  Nanetta Batty, M.D.   DATE OF BIRTH:  1923-04-09  DATE OF PROCEDURE: DATE OF DISCHARGE:                           CARDIAC CATHETERIZATION   DESCRIPTION:  Cathy Ortega is an 77 year old frail-appearing widowed Caucasian female, whose husband, Verdon Cummins, was also patient of mine.  She has a history of CAD status post bypass graft in 1998.  She was catheterized in 2000 and again in 2005, as well as 2010, revealing patent grafts with an atretic LIMA.  The problems include hypertension, hyperlipidemia, and paroxysmal atrial fibrillation, now chronic. Myoview performed this month revealed it was nonischemic.  I stopped her Coumadin because of her erratic INRs and began her on aspirin and Plavix.  Unfortunately, she did have a stroke on February 28, 2010, through March 04, 2010, with some left upper extremity motor weakness.  She underwent cerebral angiography revealing some distal right common carotid stenosis.  She was discharged home and changed from Plavix to Pradaxa.  Plan was to perform TEE-guided DC cardioversion; however, she underwent TEE by Dr. Ritta Slot last week revealing "smoke" as well as thrombus in her left atrial appendage and a cardioversion was not performed.  The plan was to see her back and change her back to Coumadin; however, in the interim, she developed increasing shortness of breath and chest pain and was admitted to [facility] for unstable angina, rule out MI.  Pradaxa was stopped and heparin was begun.  She ruled out for myocardial infarction.  No acute EKG changes.  She remained in AFib with a controlled ventricular response.  She presents now for cath to rule out ischemic etiology.  PROCEDURE DESCRIPTION:  The patient was brought to the second floor of [facility] in the postabsorptive state.  She was premedicated with p.o. Valium.  Her right groin was prepped and shaved in the usual sterile fashion.  A 1% Xylocaine was used for local anesthesia.  A 5-French sheath was inserted into the right femoral artery using standard Seldinger technique.  A 5-French right and left Judkins diagnostic catheters as well as 5-French pigtail catheter were used for selective coronary angiography, selective vein graft angiography, and left ventriculography.  Visipaque dye was used for the entirety of the case.  Retrograde aortic, left ventricular pullback pressures were recorded.  HEMODYNAMIC RESULTS:  See cath lab flow.  Selective coronary angiography: 1. Left main normal. 2. LAD; LAD was occluded after the first septal perforator. 3. Left circumflex; left circumflex had a 75% stenosis in the proximal     AV groove. 4. Right coronary artery; it is a dominant vessel with a 90% stenosis     at the genu as well as a 95% stenosis in the PLA branch. 5. Vein to the PDA; widely patent though small in caliber. 6. Vein to the distal OM branch widely patent. 7. Vein to the diagonal widely patent with retrograde filling down the  LAD. 8. Left ventriculography; RAO left ventriculogram was performed using     25 mL of Visipaque dye at 12 mL/second.  The overall LVEF was     estimated at greater than 70% with cavity obliteration and 2+     angiographic mitral regurgitation.  IMPRESSION:  Cathy Ortega has anatomy unchanged from 2 years ago with a patent vein graft to an atretic left internal mammary artery.  Her left anterior descending distribution fills by back filling from a diagonal vein graft.  Given this unchanged anatomy and a recent negative Myoview, I doubt her symptoms are ischemic in nature, and most likely related to her atrial fibrillation.  An activated clotting time was measured and sheath was removed. Pressure was held on the groin to achieve  hemostasis.  The patient left the lab in stable condition.  Heparin will be restarted by pharmacy in 4 hours and then Coumadin anticoagulation.  Continued medical therapy will be recommended.  She left the lab in stable condition.     Nanetta Batty, M.D.     Cordelia Pen  D:  07/08/2010  T:  07/09/2010  Job:  [id number]  cc:   Second Floor [facility] Corrie Mckusick, M.D.  Electronically Signed by Nanetta Batty M.D. on 07/21/2010 10:45:29 AM

## 2010-07-22 LAB — HEPARIN LEVEL (UNFRACTIONATED)
Heparin Unfractionated: 0.85 IU/mL — ABNORMAL HIGH (ref 0.30–0.70)
Heparin Unfractionated: <0.1 IU/mL — ABNORMAL LOW (ref 0.30–0.70)

## 2010-07-22 LAB — COMPREHENSIVE METABOLIC PANEL
Alkaline Phosphatase: 68 U/L (ref 39–117)
BUN: 5 mg/dL — ABNORMAL LOW (ref 6–23)
Chloride: 104 mEq/L (ref 96–112)
GFR calc non Af Amer: >60 mL/min (ref >60)
Glucose, Bld: 98 mg/dL (ref 70–99)
Potassium: 3.9 mEq/L (ref 3.5–5.1)
Total Bilirubin: 0.7 mg/dL (ref 0.3–1.2)

## 2010-07-22 LAB — BASIC METABOLIC PANEL
Calcium: 9.4 mg/dL (ref 8.4–10.5)
Calcium: 9.8 mg/dL (ref 8.4–10.5)
Creatinine, Ser: 0.69 mg/dL (ref 0.4–1.2)
GFR calc Af Amer: >60 mL/min (ref >60)
GFR calc non Af Amer: >60 mL/min (ref >60)
GFR calc non Af Amer: >60 mL/min (ref >60)
Glucose, Bld: 104 mg/dL — ABNORMAL HIGH (ref 70–99)
Sodium: 140 mEq/L (ref 135–145)

## 2010-07-22 LAB — PROTIME-INR
Prothrombin Time: 12.7 seconds (ref 11.6–15.2)
Prothrombin Time: 13.3 seconds (ref 11.6–15.2)

## 2010-07-22 LAB — DIFFERENTIAL
Basophils Absolute: 0 10*3/uL (ref 0.0–0.1)
Basophils Relative: 0 % (ref 0–1)
Monocytes Absolute: 0.4 10*3/uL (ref 0.1–1.0)
Neutro Abs: 3.5 10*3/uL (ref 1.7–7.7)
Neutrophils Relative %: 67 % (ref 43–77)

## 2010-07-22 LAB — CBC
HCT: 38 % (ref 36.0–46.0)
HCT: 38.9 % (ref 36.0–46.0)
HCT: 41.1 % (ref 36.0–46.0)
Hemoglobin: 13.2 g/dL (ref 12.0–15.0)
Hemoglobin: 14.2 g/dL (ref 12.0–15.0)
MCHC: 34.6 g/dL (ref 30.0–36.0)
MCV: 89.2 fL (ref 78.0–100.0)
Platelets: 251 10*3/uL (ref 150–400)
RDW: 12.2 % (ref 11.5–15.5)
RDW: 12.5 % (ref 11.5–15.5)
WBC: 5.2 10*3/uL (ref 4.0–10.5)
WBC: 7 10*3/uL (ref 4.0–10.5)

## 2010-07-22 LAB — HEMOGLOBIN A1C
Hgb A1c MFr Bld: 5.8 % (ref 4.6–6.1)
Mean Plasma Glucose: 120 mg/dL

## 2010-07-22 LAB — GLUCOSE, CAPILLARY: Glucose-Capillary: 108 mg/dL — ABNORMAL HIGH (ref 70–99)

## 2010-07-22 LAB — TROPONIN I: Troponin I: 0.01 ng/mL (ref 0.00–0.06)

## 2010-07-22 LAB — POCT I-STAT, CHEM 8
Calcium, Ion: 1.24 mmol/L (ref 1.12–1.32)
Chloride: 105 mEq/L (ref 96–112)
HCT: 40 % (ref 36.0–46.0)
Sodium: 139 mEq/L (ref 135–145)
TCO2: 27 mmol/L (ref 0–100)

## 2010-07-22 LAB — CARDIAC PANEL(CRET KIN+CKTOT+MB+TROPI)
CK, MB: 2.1 ng/mL (ref 0.3–4.0)
CK, MB: 2.2 ng/mL (ref 0.3–4.0)
Relative Index: 2 (ref 0.0–2.5)
Total CK: 79 U/L (ref 7–177)
Total CK: 86 U/L (ref 7–177)

## 2010-07-22 LAB — CK TOTAL AND CKMB (NOT AT ARMC)
Relative Index: INVALID (ref 0.0–2.5)
Total CK: 94 U/L (ref 7–177)

## 2010-07-22 LAB — POCT CARDIAC MARKERS: Troponin i, poc: <0.05 ng/mL (ref 0.00–0.09)

## 2010-07-23 LAB — CREATININE, SERUM
GFR calc Af Amer: >60 mL/min (ref >60)
GFR calc non Af Amer: >60 mL/min (ref >60)

## 2010-07-25 ENCOUNTER — Emergency Department (HOSPITAL_COMMUNITY)
Admission: EM | Admit: 2010-07-25 | Discharge: 2010-07-25 | Disposition: A | Discharge status: Home / Self Care | Payer: Medicare Other | Attending: Emergency Medicine | Admitting: Emergency Medicine

## 2010-07-25 DIAGNOSIS — R42 Dizziness and giddiness: Secondary | ICD-10-CM | POA: Insufficient documentation

## 2010-07-25 DIAGNOSIS — R110 Nausea: Secondary | ICD-10-CM | POA: Insufficient documentation

## 2010-07-25 DIAGNOSIS — I2510 Atherosclerotic heart disease of native coronary artery without angina pectoris: Secondary | ICD-10-CM | POA: Insufficient documentation

## 2010-07-25 DIAGNOSIS — Z9889 Other specified postprocedural states: Secondary | ICD-10-CM | POA: Insufficient documentation

## 2010-07-25 DIAGNOSIS — Z79899 Other long term (current) drug therapy: Secondary | ICD-10-CM | POA: Insufficient documentation

## 2010-07-25 DIAGNOSIS — R0602 Shortness of breath: Secondary | ICD-10-CM | POA: Insufficient documentation

## 2010-07-25 DIAGNOSIS — E785 Hyperlipidemia, unspecified: Secondary | ICD-10-CM | POA: Insufficient documentation

## 2010-07-25 DIAGNOSIS — I10 Essential (primary) hypertension: Secondary | ICD-10-CM | POA: Insufficient documentation

## 2010-07-25 DIAGNOSIS — Z7982 Long term (current) use of aspirin: Secondary | ICD-10-CM | POA: Insufficient documentation

## 2010-07-25 DIAGNOSIS — K219 Gastro-esophageal reflux disease without esophagitis: Secondary | ICD-10-CM | POA: Insufficient documentation

## 2010-08-25 NOTE — Discharge Summary (Signed)
Cathy Ortega, SANDLES NO.:  [id number]   MEDICAL RECORD NO.:  [id number]          PATIENT TYPE:  INP   LOCATION:  2027                         FACILITY:  [facility]   PHYSICIAN:  Ritta Slot, MD     DATE OF BIRTH:  [birth year]   DATE OF ADMISSION:  08/07/2008  DATE OF DISCHARGE:  08/09/2008                               DISCHARGE SUMMARY   ADDENDUM:  The patient was in atrial fib on arrival.  She was in at less  than 24 hours.  We are not placing her on Coumadin at this point.   RADIOLOGY:  CT angio, there was no aortic dissection.  She had normal  pelvic, small bowel, fat-containing right inguinal hernia.  No pelvic  adenopathy.  Hysterectomy.  No and adnexal mass.  There was no acute  abdominal process, mild celiac axis stenosis with minimal  atherosclerotic irregularity nearing of the superior mesenteric axis,  mild celiac axis stenosis with poststenotic dilatation, single renal  arteries, history of cholecystectomy.  No biliary ductal dilatation.  On  her CT of her chest, she had scattered hypoattenuating liver lesions  felt to represent cyst on the study of June 21, 2008, a right liver  lobe lesion 1.5 cm, has minimal complexity with them.  These were  similar in size to the prior exam.  Also CT of the chest, scattered tiny  lung nodules, may need a follow up CT in 1 year.  Enlargement of the  right greater than the left lobe thyroid nonspecific.  TSH within normal  limits.   Portable chest x-ray; increase nodular interstitial markings at the  right lung base, differential pneumonia on atelectasis, no acute  pulmonary edema.   LABORATORY DATA ON ADMISSION:  Cardiac markers were all negative with CK  94, MB 2.1, and troponin 0.01.  Followup CK 107, MB 2.1, troponin I  0.02, CK 86, MB 2.3, and troponin 0.01.  These are all remained the  same.   CBC with diff on admission; hemoglobin 13.2, hematocrit 38.9, WBC 5.2,  platelets 262, neutrophils 67, lymphocytes  23, monocytes 8, eosinophils  2, basophil 1.  Hemoglobin remained stable on followup.  Comprehensive  metabolic panel on admission; sodium 139, potassium 3.9, chloride 104,  CO2 28, glucose 98, BUN 5, creatinine 0.55.  SGOT 26, SGPT 18, total  protein 6.5, albumin 3.7, total bili 0.7 and at discharge, similar  numbers with creatinine 0.69, BUN 15.   On heparin, her heparin level was therapeutic.   Protime on admission 12.7, INR of 0.9 and PTT 30.  TSH 2.885.   Glycohemoglobin 5.8.   EKG on admission; atrial fibrillation with rapid ventricular response to  controlled ventricular response, but no acute changes and follow up in 2  hours later with sinus rhythm.  No acute changes.  On August 08, 2008  sinus rhythm with occasional PVC, possible old anterior MI.  The  patient will be scheduled for 2-D echo as an outpatient.      [name]. [place], N.P.      Ritta Slot, MD  Electronically Signed  LRI/MEDQ  D:  08/09/2008  T:  08/09/2008  Job:  [id number]   cc:   Corrie Mckusick, M.D.  Nanetta Batty, M.D.

## 2010-08-25 NOTE — Consult Note (Signed)
NAME:  Cathy Ortega, Cathy Ortega NO.:  [id number]   MEDICAL RECORD NO.:  [id number]          PATIENT TYPE:  EMS   LOCATION:  MAJO                         FACILITY:  [facility]   PHYSICIAN:  Petra Kuba, M.D.    DATE OF BIRTH:  Dec 15, 1922   DATE OF CONSULTATION:  01/10/2008  DATE OF DISCHARGE:                                 CONSULTATION   HISTORY:  The patient with history of esophageal stricture dilated by my  partner, Dr. Matthias Hughs years ago who had actually been doing fine from a  swallowing standpoint until eating some melon today at the hospital,  felt like it got caught, needed Heimlich maneuver which brought it up  some, but she continues to feel like it is in her esophagus, although  she can get a little bit of water past it. We were requested to proceed  with an endoscopy.  She has not had any GI issues for years and did not  have any problems with the endoscopies.   PAST MEDICAL HISTORY:  Pertinent for CABG and increased cholesterol.  She also has a history of the hiatal hernia, diverticula, and a  stricture in the past.  She has also had a thyroidectomy and back  surgery.   FAMILY HISTORY:  Nonapplicable.   SOCIAL HISTORY:  Does not drink or smoke.   ALLERGIES:  Quite extensive including CODEINE, PENICILLIN, CIPRO,  MACROBID, DICYCLOMINE, FLAGYL, LEVAQUIN, BIAXIN, PRILOSEC, ZYRTEC,  PEPCID, DOXYCYCLINE, ARTHROTEC, CLARITIN, CELEBREX, TENORMIN, NORVASC,  PLAVIX, PROCARDIA, ZOCOR, LIPITOR, and PERCOCET.   CURRENT MEDICINES:  Toprol, Imdur, coated aspirin, Centrum, Lescol,  Zetia, AcipHex, MiraLax, and Ambien.   REVIEW OF SYSTEMS:  Negative except above.   PHYSICAL EXAMINATION:  VITAL SIGNS:  See chart.  GENERAL:  No acute distress.  LUNGS:  Clear.  CARDIAC:  Regular rate and rhythm.  ABDOMEN:  Soft and nontender.   ASSESSMENT:  Possible food impaction or partial food impaction.   PLAN:  The risks, benefits, and methods of endoscopy were discussed.  We  reviewed her allergies extensively.  She did not have any problems with  her previous endoscopy and since Demerol and Valium are not on the list,  we feel comfortable sedating her with further workup and plans pending  findings at endoscopy.  The risks were discussed with her and her  daughter-in-law.           ______________________________  Petra Kuba, M.D.     MEM/MEDQ  D:  01/10/2008  T:  01/11/2008  Job:  [id number]   cc:   Petra Kuba, M.D.  Bernette Redbird, M.D.

## 2010-08-25 NOTE — Discharge Summary (Signed)
[name], [name] NO.:  [id number]   MEDICAL RECORD NO.:  [id number]          PATIENT TYPE:  INP   LOCATION:  2027                         FACILITY:  [facility]   PHYSICIAN:  Ritta Slot, MD     DATE OF BIRTH:  [birth year]   DATE OF ADMISSION:  08/07/2008  DATE OF DISCHARGE:  08/09/2008                               DISCHARGE SUMMARY   DISCHARGE DIAGNOSES:  1. Chest pain secondary to paroxysmal atrial fibrillation with rapid      ventricular response.      a.     Negative myocardial infarction.      b.     Stable coronary arteries.  2. Paroxysmal atrial fibrillation with rapid ventricular response      resolved within 12 hours.  3. Coronary disease with history of bypass grafting in 1998.      a.     Cardiac catheterization revealed patent saphenous vein graft       to the right coronary artery, patent saphenous vein graft to the       obtuse marginal, patent saphenous vein graft to diagonal 1, left       internal mammary artery was atretic to the left anterior       descending, and the posterolateral artery with 90% stenosis.      b.     Will need a 2-D echo to evaluate ejection fraction though       nuclear study in January was within normal limits.  4. Dyslipidemia.  5. Hypertension, controlled.  6. History of anxiety.  7. Peripheral vascular disease with 50-60% narrowing on the right      renal artery.  8. Multiple medication intolerances.   DISCHARGE MEDICATIONS:  1. Aspirin 325 mg daily.  2. Toprol-XL 50 mg twice a day.  3. Lavacol 80 mg daily.  4. MiraLax 17 g in liquid daily.  5. Aciphex 20 mg daily.  6. Zetia 10 mg daily.  7. Multivitamin daily.  8. Ambien 10 mg for sleep as needed.  9. Nitroglycerin 1/150 under tongue as needed for chest pain, 1 every      5 minutes up to 3 over 15 minutes, if no relief call 911.  10.Cardizem CD 120 mg daily which is a new medication.  11.Imdur.  12.Isosorbide mononitrate 90 mg daily.   DISCHARGE  INSTRUCTIONS:  1. Low-sodium, heart-healthy diet.  2. Wash cath site with soap and water.  Call if any bleeding,      swelling, or drainage.  3. Increase activity slowly.  4. No lifting for 2 days.  No driving for 2 days.  5. May shower.  6. Follow with Dr. Allyson Sabal in [place], Aug 22, 2008, at 11:30 a.m.      Please note, we are also adjusting her husband's tests so they will      coincide on the same day.   HISTORY OF PRESENT ILLNESS:  An 77 year old white married female,  patient of Dr. Hazle Coca, was seen in the emergency room on August 07, 2008, after developing chest pain and rapid heart rate on the  night  before.   On August 06, 2008, she had walked which she normally does, she had felt  well, no problems.  She got home, felt well, and then she developed  chest pain and shortness of breath that lasted several hours.  She had  to lie on her back.  She had to prop herself up because she could not  breath well lying flat.  She felt her heart pounding, it was fast.  She  did not have any nitroglycerin, but in the emergency room she was given  nitroglycerin with aspirin with complete relief of her pain.  In the ER  on arrival, she was in atrial fibrillation, AFib flutter, at times rapid  ventricular response, at other times more controlled.  The patient was  seen by ER physician, and due to the severeness of her chest pain and  with radiation to her back she was sent for CT angio.  On arrival back  from CT, she was back in sinus rhythm.  She did not have any evidence of  aortic dissection, and she was placed on heparin and then admitted to  the step-down unit.  She had no further chest pain, no further atrial  fibrillation.   The patient underwent cardiac catheterization on August 08, 2008, and  with results as stated previously.  At this point, it was felt due to  the tachycardia, the AFib, perhaps the 90% PLA was stressed causing  chest pain.  Her cardiac enzymes were negative.    By August 09, 2008, the patient had no complaints, was ready for  discharge home and she will be discharged.  We had put her on  nitroglycerin paste.  This will be switched back to her Imdur 90 mg  daily as before, and she will ambulate with Cardiac Rehab prior to  discharge to ensure she is stable with activity as well.  She will  follow up with Dr. Allyson Sabal and with Dr. Phillips Odor.  She will also be given  prescription for nitroglycerin so that if she does develops more chest  pain she will have something to take at home.   The patient has multiple allergies which are CODEINE, DOXYCYCLINE,  CIPROFLOXACIN, FAMOTIDINE, PENICILLINS, MACROLIDES, LEVAQUIN,  NITROFURANTOIN, DICLOFENAC, MACROBID, METRONIDAZOLE, BIAXIN, PRILOSEC,  ZYRTEC, ARTHROTEC, CLARITIN-D, CELEBREX, TENORMIN, PLAVIX has caused  indigestion in the past, PLETAL, PROCARDIA, ZOCOR, LIPITOR, and  PERCOCET.  She is currently tolerating Lavacol for lipids and we have  left her on that medication.      Cathy Ortega. [place], N.P.      Ritta Slot, MD  Electronically Signed    LRI/MEDQ  D:  08/09/2008  T:  08/09/2008  Job:  [id number]   cc:   Nanetta Batty, M.D.  Corrie Mckusick, M.D.

## 2010-08-25 NOTE — Op Note (Signed)
NAMEEVERETTE, DIMAURO NO.:  [id number]   MEDICAL RECORD NO.:  [id number]          PATIENT TYPE:  EMS   LOCATION:  MAJO                         FACILITY:  [facility]   PHYSICIAN:  Petra Kuba, M.D.    DATE OF BIRTH:  June 20, 1922   DATE OF PROCEDURE:  01/10/2008  DATE OF DISCHARGE:                               OPERATIVE REPORT   PROCEDURE:  Esophagoscopy.   INDICATION:  Questionable food impaction.  Consent was signed after the  risks, benefits, methods, and options were thoroughly discussed with the  patient and her daughter-in-law.   MEDICINES USED:  Fentanyl 25 mcg and Versed 3 mg.   PROCEDURE:  The video endoscope was inserted by direct vision.  The  esophagus was easily intubated and no food was seen.  She did have some  spasticity of her esophagus and there was a tiny to small hiatal hernia  with a widely patent ring distally.  The scope was passed into the  stomach, lots of food was seen, and we elected to not advance any  further to cause more air insufflation or retching or gagging.  The  scope was slowly withdrawn through the esophagus.  The upper esophageal  sphincter was slightly edematous with patchy erythematous, but again no  food was seen in the esophagus.  A quick look at the posterior pharynx  did show the melon to be just next to the cords in the posterior  pharynx.  Using the tripod grabber, we were carefully able to grab the  melon and withdraw back onto the scope and suction and withdraw it back  into the patient's mouth.  It could not come through the bite block  because of the size of it.  The bite block was removed and the  melon  was removed.  We did reinsert the scope.  On quick look at the posterior  pharynx, there was no trauma or residual food.  We did reinsert the  scope into the esophagus and one more time advanced to the stomach and  again the esophagus was free of food.  The scope was removed.  The  patient tolerated the procedure  well.  There was no obvious immediate  complication.   ENDOSCOPIC DIAGNOSES:  1. Normal esophagus except for spasm.  2. Small hiatal hernia and a widely patent ring.  3. Increased food in the stomach, did not advance into the stomach.  4. Mellon in the posterior pharynx, seen on withdrawal, not seen on      insertion, grabbed with the tripod grabbers, then removed.  5. Negative obvious residual food or scope trauma to the posterior      pharynx or the esophagus except for some minimal upper esophageal      sphincter edema seen on withdrawal the first time.   PLAN:  Observe for delayed complications.  Slowly advance diet.  To  follow up with either me or Dr. Matthias Hughs in 2-4 weeks to recheck  swallowing and make sure no further workup plans are needed.  Happy to  see back sooner p.r.n.  ______________________________  Petra Kuba, M.D.    MEM/MEDQ  D:  01/10/2008  T:  01/11/2008  Job:  [id number]   cc:   Bernette Redbird, M.D.

## 2010-08-28 NOTE — Procedures (Signed)
[facility]  Patient:    Cathy Ortega, Cathy Ortega                      MRN: [id number] Proc. Date: 06/27/00 Adm. Date:  [id number] Attending:  Rich Brave CC:         Luciana Axe, M.D., [address] [place] [address]                           Procedure Report  PROCEDURE PERFORMED:  Colonoscopy.  ENDOSCOPIST:  Florencia Reasons, M.D.  INDICATIONS:  A 77 year old female with a family history of colon cancer.  Her most recent colonoscopy, approximately five years ago was negative for any polyps.  FINDINGS:  A large fixation of the colon and moderate melanosis coli.  DESCRIPTION OF PROCEDURE:  The nature, purpose, and risk of the procedure were familiar to the patient from prior examination.  She provided written consent.  The Olympus pediatric colonoscope was advanced to the cecum without difficulty through a very fixated sigmoid region associated with quite a bit of diverticular disease.  Once pass this area, we were able to advance quite easily to the cecum as I identified by transillumination of the suprapubic area and visualization of what appeared to be the appendiceal orifice.  Pull back was then performed.  The quality of the prep was very good and it is felt that all areas were adequately seen.  There was a moderate amount of melanosis coli.  No polyps, cancer, colitis or vascular malformations were seen.  There was moderate sigmoid diverticulosis.  Retroflexion of the rectum was unremarkable.  No biopsies were obtained.  There were no apparent complications.  The patient tolerated the procedure well.  IMPRESSION:  Family history of colon cancer, but no polyps at this time. Sigmoid diverticulosis and fixation of the colon making for somewhat difficult advancement through the sigmoid region as described above.  PLAN:  In view of the patients consistent medical conditions (coronary disease, status post CABG) and the  fact that she has now had two colonoscopies approximately five years apart with no evidence of polyps, I feel that a good case could be made for no further surveillance colonoscopies.  The patient would next be due for a colonoscopy at age 14, by which time the risk of the procedure would probably outweigh the benefit.  Accordingly, I would anticipate doing screening colonoscopies in this patient only if symptoms should arise. DD:  06/27/00 TD:  06/28/00 Job: [id number]

## 2010-08-28 NOTE — H&P (Signed)
[facility]  Patient:    Cathy Ortega, Cathy Ortega Visit Number: [id number] MRN: [id number]          Service Type: MED Location: 3A A313 01 Attending Physician:  Colette Ribas Dictated by:   Colette Ribas, M.D. Admit Date:  06/13/2001 Discharge Date: 06/16/2001                           History and Physical  Cathy Ortega MRN#:  [id number]  DATE OF BIRTH:  15-Apr-1922.  ADMITTING DIAGNOSIS:  Bronchitis.  ADDITIONAL DIAGNOSIS:  Coronary artery disease.  HISTORY OF PRESENT ILLNESS:  This 77 year old female with history of coronary artery disease, hyperlipidemia, colon carcinoma, and mild COPD who presented to the office on June 09, 2001. She presented with cough and mild shortness of breath, no wheezing. PO2 sat on room air is 96%. She had had these symptoms for three to four days. She had some upper rhonchi but no decrease breath sounds and again, no wheezing. Chest x-ray was taken at that time. She was placed on Bactrim twice a day for 10 days. Anaplex and Depo-Medrol was given. She was placed on the Bactrim due to the fact that she has high incidence of multiple allergies and Bactrim is one of the only medicines she can take.  She presented today in the office with continued cough and mild shortness of breath. Little bit more tightness in the chest with some end expiratory wheezing. O2 sat remained 96% on room air, but I felt like she needed to be in the hospital for nebulizer treatments and aggressive antibiotic treatment.  REVIEW OF SYSTEMS:  CARDIOVASCULAR:  Negative with no chest pain. No other associated symptomatology.  PAST MEDICAL HISTORY: 1. Coronary artery disease. 2. Hyperlipidemia. 3. Osteoarthritis. 4. Mild COPD. 5. GERD. 6. Anxiety disorder.  PAST SURGICAL HISTORY: 1. CABG x four in February 1998. 2. Partial hysterectomy.  CURRENT MEDICATIONS: 1. Xanax 0.5 mg b.i.d. p.r.n. 2. Aspirin 325 mg q.d. 3. Stool  softener q.d. 4. Imdur 30 mg 3 tablets q.d. 5. Nitrostat p.r.n. 6. Toprol XL 100 mg q.d. 7. Lipitor 40 mg p.o. q.d.  ALLERGIES:  Numerous, please see list in the chart.  FAMILY HISTORY/SOCIAL HISTORY:  Noncontributory.  PHYSICAL EXAMINATION:  VITAL SIGNS:  Blood pressure 130/80, pulse 72, respirations 18, temperature 98.0. O2 sat 96% on room air.  GENERAL:  When I saw the patient she was pleasant, talkative, in no acute distress.  HEENT:  Normocephalic, atraumatic.  Pupils equal, round and reactive to light. Sclerae nonicteric. Nasopharynx and oropharynx are clear.  NECK:  No JVD.  CHEST:  Diffuse, mild and expiratory wheeze. No areas of consolidation. Mild upper airway rhonchi.  CARDIOVASCULAR:  Regular rate and rhythm. Normal S1, S2. There is a soft 1/6 systolic murmur best heard over left lower sternal border.  ABDOMEN:  Bowel sounds positive. Soft, nontender.  EXTREMITIES:  No cyanosis, clubbing, or edema.  ASSESSMENT:  This 77 year old female with probably bronchitis and in need for inpatient treatment.  PLAN: 1. Admit for aggressive nebulizer treatments, albuterol, Atrovent q.i.d.    with albuterol nebs q.3h. p.r.n. 2. Rocephin 1 g q.24h. 3. Continue Bactrim DS b.i.d. orally. 4. Check CBC, Chem-12, as well as chest x-ray. 5. Will consider getting [facility] involved if we feel like this is more of    a cardiovascular problem. As of now, it appears to be more respiratory. 6. O2 as needed. 7. Will continue  followup closely.Dictated by:   Colette Ribas, M.D.  Attending Physician:  Colette Ribas DD:  06/13/01 TD:  06/13/01 Job: [id number]

## 2010-08-28 NOTE — H&P (Signed)
NAME:  Cathy Ortega, Cathy Ortega                         ACCOUNT NO.:  [id number]   MEDICAL RECORD NO.:  [id number]                   PATIENT TYPE:  INP   LOCATION:  2860                                 FACILITY:  [facility]   PHYSICIAN:  Payton Doughty, M.D.                   DATE OF BIRTH:  [birth year]   DATE OF ADMISSION:  12/20/2003  DATE OF DISCHARGE:                                HISTORY & PHYSICAL   ADMISSION DIAGNOSIS:  Recurrent disk on the right side at L4-5 and  spondylosis at L3-4.   HISTORY OF PRESENT ILLNESS:  A very nice, 77 year old, right-handed, white  lady who underwent a diskectomy a year ago and did well.  She now has had  increasing pain in her back and her right leg.  She is now admitted for  decompression and fusion at L3-4 and L4-5.   MEDICAL HISTORY:  Remarkable for coronary artery disease.  She had a bypass  in 1998 and has had clearance.   MEDICATIONS:  Toprol, Imdur, aspirin, Nitrostat, Lescol, Zetia, MiraLax,  Lactulose and Aciphex.   ALLERGIES:  PLAVIX.   SURGICAL HISTORY:  1.  Bypass.  2.  Hysterectomy.  3.  Cholecystectomy in 1990.   SOCIAL HISTORY:  She does not smoke or drink.  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   FAMILY HISTORY:  Mom died at 17 of cancer.  Dad died at 13 of MI.   REVIEW OF SYSTEMS:  Remarkable for hypercholesterolemia, leg pain while she  is walking, arthritis and neck pain.   PHYSICAL EXAMINATION:  HEENT:  Within normal limits.  NECK:  She has good range of motion.  CHEST:  Clear.  CARDIAC:  Regular rate and rhythm.  ABDOMEN:  Nontender.  No hepatosplenomegaly.  EXTREMITIES:  Without clubbing or cyanosis.  GENITOURINARY:  Exam is deferred.  PERIPHERAL PULSES:  Good.  NEUROLOGIC:  She is awake, alert and oriented.  Cranial nerves area intact.  Motor exam shows 5/5 strength throughout the upper and lower extremities  save for the knee extensors on the right which are pain limited at 4/5.  Weakness of dorsiflexors at  5-/5 on the right.  Sensation diminished in the  right L4-5 distribution.   MR demonstrates severe spondylosis at L3-4 and disk recurrence at L4-5 on  the right.   CLINICAL IMPRESSION:  Right lumbar radiculopathy related to progressive  spondylosis and recurrent herniated disk at L4-5.   PLAN:  Lumbar laminectomy, diskectomy, posterior lumbar body fusion with  __________ fusion cages and posterolateral arthrodesis at L3-4 and L4-5.  The risks and benefits of this approach have been discussed with her and she  wishes to proceed.  Payton Doughty, M.D.    MWR/MEDQ  D:  12/20/2003  T:  12/20/2003  Job:  [id number]

## 2010-08-28 NOTE — H&P (Signed)
NAME:  Cathy Ortega, Cathy Ortega NO.:  [id number]   MEDICAL RECORD NO.:  [id number]                   PATIENT TYPE:  INP   LOCATION:  3314                                 FACILITY:  [facility]   PHYSICIAN:  Payton Doughty, M.D.                   DATE OF BIRTH:  [birth year]   DATE OF ADMISSION:  11/30/2002  DATE OF DISCHARGE:  12/03/2002                                HISTORY & PHYSICAL   ADMISSION DIAGNOSIS:  Herniated disk at L4-5 on the right side.   HISTORY OF PRESENT ILLNESS:  Cathy Ortega is a very nice 77 year old right-  handed white lady who had been seen for about a year.  She has had pain in  her leg, did an epidural steroid injection, got better and returned to the  office four days ago with a marked increase in right leg pain, and an MRI of  that shows a herniated disk at L4-5 with compression of the right 4 root and  she is now admitted for disk.   PAST MEDICAL HISTORY:  Remarkable for coronary artery disease.  She had a  bypass in 1998.   MEDICATIONS:  1. Toprol XL 75 mg daily.  2. Imdur 90 mg daily.  3. Aspirin 81 mg daily.  4. Nitrostat on a p.r.n. basis.  5. Lescol 80 mg daily.  6. Zetia 10 mg daily.  7. MiraLax once daily.  8. Lactulose.  9. Aciphex 20 mg once daily.   ALLERGIES:  PLAVIX.   SURGICAL HISTORY:  1. Coronary artery bypass as noted before.  2. Hysterectomy and cholecystomy in 1990.   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   FAMILY HISTORY:  Mom died at 43 or cancer.  Father died at 36 of a  myocardial infarction.   REVIEW OF SYSTEMS:  Remarkable for hypercholesterolemia.  Leg pain while she  is walking.  Leg pain arthritis and neck pain.   PHYSICAL EXAMINATION:  HEENT: Exam normal.  NECK: She has good range of motion of her neck.  CHEST: Clear.  CARDIAC: Regular rate and rhythm.  ABDOMEN: Nontender with no hepatosplenomegaly.  EXTREMITIES: Without clubbing or cyanosis.  Peripheral  pulses are good.  GU EXAM: Deferred.  NEUROLOGIC: She is awake, alert and oriented.  Cranial nerves are intact.  Motor exam shows 5/5 strength throughout the upper and lower extremities  save for the knee extensors on the right which are pain limited, 4/5 and  slight weakness of the dorsiflexions of 5 minus 5.  She has a light L5  sensory deficit.  Deep tendon reflexes are 1 at the left knee, absent at the  right.  Straight leg raises exquisitely positive on the right.   MRI results have been reviewed above that show a herniated disk at 4-5 with  compression of both 4 and 5 rated on  the right side.   CLINICAL IMPRESSION:  Mixed right L4 and L5 radiculopathy secondary to  hernia of the disk at foraminal component.   PLAN:  For lumbar laminectomy, diskectomy.  The risks and benefits of this  approach have been discussed with her and she wishes to proceed.                                                Payton Doughty, M.D.    MWR/MEDQ  D:  11/30/2002  T:  12/01/2002  Job:  [id number]

## 2010-08-28 NOTE — Procedures (Signed)
[facility]  Patient:    Cathy Ortega, Cathy Ortega                      MRN: [id number] Proc. Date: 05/22/99 Adm. Date:  [id number] Attending:  Rich Brave CC:         Dr. Luciana Axe in [place], M.D. at [address] [place],                           Procedure Report  PROCEDURE:  Upper endoscopy and Savary dilatation of the esophagus.  ENDOSCOPIST:  Florencia Reasons, M.D.  ANESTHESIA:  INDICATIONS:  Dysphagia, reflux, and nonspecific chest symptoms in a 77 year old patient, status post CABG.  FINDINGS:  Esophageal ring dilated to 18 mm.  DESCRIPTION OF PROCEDURE:  The nature, purpose, and risks of the procedure had een discussed with the patient who provided written consent.  Sedation was fentanyl  45 mcg and Versed 2.5 mg IV without arrhythmias or desaturation.  The Olympus video endoscope was passed under direct vision.  The vocal cords looked normal and the esophagus was easily entered.  The esophageal mucosa was normal, without evidence of any reflux esophagitis, Barretts esophagus, varices, infection, or neoplasia.  However, there was a well-defined, fairly widely-patent esophageal mucosal ring  (Schatzkis ring) at the squamocolumnar junction and below this was a 2 cm hiatal hernia.  The stomach was entered.  It contained no significant residual.  The gastric mucosa was unremarkable without evidence of gastritis, erosions, ulcers, polyps, or masses.  A retroflexed view of the proximal stomach was unremarkable.  The pylorus, duodenal bulb, and the second duodenum looked normal.  Savary dilatation was then performed in the standard fashion.  The spring tip guidewire was passed into the antrum of the stomach.  The scope was removed in n exchanged fashion, and sequential passage was made using Savary dilators, sizes  16 mm and 18 mm, in each case confirming fluoroscopically  appropriate positioning of the wire and passage to the widest portion of the dilator to the level of the diaphragm.  The patient was then re-endoscoped under direct vision, confirming he absence of any undue trauma to the hypopharynx, esophagus, or stomach as far as I could see.  The ring did not appear to be overtly fractured.  The patient tolerated this procedure well and there were no apparent complications.  IMPRESSION: 1. Small hiatal hernia with well-defined Schatzkis ring. 2. No definite source of ongoing chest pain endoscopically evident.  PLAN:  Clinical follow up of symptoms, particularly her dysphagia symptoms, status post endoscopic dilatation. DD:  05/22/99 TD:  05/24/99 Job: [id number]

## 2010-08-28 NOTE — Op Note (Signed)
NAME:  GAL, SMOLINSKI                         ACCOUNT NO.:  [id number]   MEDICAL RECORD NO.:  [id number]                   PATIENT TYPE:  AMB   LOCATION:  DAY                                  FACILITY:  [facility]   PHYSICIAN:  Vickki Hearing, M.D.           DATE OF BIRTH:  Nov 02, 1922   DATE OF PROCEDURE:  05/22/2003  DATE OF DISCHARGE:                                 OPERATIVE REPORT   PREOPERATIVE DIAGNOSIS:  Left carpal tunnel syndrome.   POSTOPERATIVE DIAGNOSIS:  Left carpal tunnel syndrome.   PROCEDURE:  Left carpal tunnel release.   OPERATIVE FINDINGS:  Compressed median nerve.   SURGEON:  Vickki Hearing, M.D.   ANESTHETIC:  BEER block.   TECHNIQUE:  Ms. Warmuth was identified in the holding area by arm band.  Medical record and consent confirmed a left carpal tunnel release was to be  performed.  She was given vancomycin because of a severe PENICILLIN allergy  and taken to the operating room for a BEER block.  Once that was successful,  she was prepped and draped.  Time out was taken to confirm the patient's  extremity and procedure.  All agreed, and we proceeded with a carpal tunnel  release.   An incision was made over the carpal tunnel and divided to the palmar  fascia.  The distal extent of the carpal tunnel was identified.  Blunt  retraction was carried out beneath the transverse carpal ligament, and the  ligament was released.  The would was irrigated and closed with 3-0 nylon  suture.  Then 10 mL of 0.5% Sensorcaine was injected around the wound.  Sterile bandages were applied.  The tourniquet was released.  The  hand  looked good in terms of vascularity.  The patient was taken to the recovery  room in stable condition, follow-up scheduled for 48 hours.  Instructions  are ice and elevation.  Medication dispense Vicodin 5/500, 1 q.6h. p.r.n.  for pain #40, 1 refill.      ___________________________________________   Vickki Hearing, M.D.   SEH/MEDQ  D:  05/22/2003  T:  05/22/2003  Job:  [id number]   cc:   Barbaraann Barthel, M.D.  [address]  [place]  [place] [address]  Fax: [phone]

## 2010-08-28 NOTE — Discharge Summary (Signed)
NAME:  Cathy Ortega, Cathy Ortega                         ACCOUNT NO.:  [id number]   MEDICAL RECORD NO.:  [id number]                   PATIENT TYPE:  INP   LOCATION:  3733                                 FACILITY:  [facility]   PHYSICIAN:  Nanetta Batty, M.D.                DATE OF BIRTH:  Jun 15, 1922   DATE OF ADMISSION:  04/30/2002  DATE OF DISCHARGE:  05/02/2002                                 DISCHARGE SUMMARY   DISCHARGE DIAGNOSES:  1. Unstable angina pectoris, resolved.  2. Known coronary artery disease status post coronary artery bypass     grafting; status post cardiac catheterization this admission.  3. Hyperlipidemia.  4. Peripheral vascular disease.  5. Statin intolerance, treated with Lescol and Zetia.   HISTORY OF PRESENT ILLNESS:  Cathy Ortega is a 77 year old Caucasian female  with prior history of coronary artery disease who presented to [facility] with complaints of chest pain and shortness of breath. She also  complained of mild nausea but did not have any vomiting or diaphoresis. The  patient was brought by EMS to the emergency room at [facility] for  evaluation.   She was admitted on a rule out MI protocol with IV nitroglycerin, heparin,  and serial enzymes. Enzymes were negative, and EKG did not show any acute ST-  T wave changes.   The patient underwent cardiac catheterization on 05/01/02. This revealed  normal left main. LAD had 80% proximal stenosis before the first diagonal  branch, and 50% segmental stenosis after the diagonal branch. Left  circumflex had 80% stenosis in the proximal portion of the right coronary  artery, 50 to 60% stenosis in the mid portion, and 95% in the mid PDA  proximal to the graft insertion.   The LIMA to the LAD was atretic. Vein graft to the diagonal branch was  widely patent with retrograde filling of the LAD and circumflex, SVG to  obtuse marginal was widely patent, SVG to PDA was a small caliber vessel and  was widely  patent although there was some dumping upon engagement. There  also was a retrograde filling of the entire right coronary artery with PDA  injection. Left ventriculography revealed no focal wall motion abnormalities  and ejection fraction of 60%. Distal abdominal aorta did not reveal any  aortic aneurysms or abnormalities, but there was 50% proximal right renal  artery stenosis. Infrarenal aorta and aortic bifurcation appeared free of  significant vascular sclerotic disease. Recommendations:  Medical therapy.   The patient tolerated the procedure well, was transferred from the  catheterization lab to the floor in stable condition. The next morning, she  was assessed by Dr. Allyson Sabal. Her renal function was excellent. Creatinine 0.7,  BUN 7, potassium was 4.4, glucose 100; hemoglobin 13.6, hematocrit 39.4. Her  groin did not have any problems such as bleeding, ecchymosis, or any  swelling, and she did not complain of any  pain with ambulation. She was  stable for discharge home and discharged home on 05/02/02.   DISCHARGE MEDICATIONS:  1. Toprol 75 mg daily.  2. Zetia 10 mg daily.  3. Lescol 80 mg daily. The patient has intolerance to statins.  4. Aciphex 20 mg daily.  5. MiraLax daily.  6. Aspirin 325 mg daily.  7. Imdur 90 mg daily.   DISCHARGE ACTIVITIES:  No driving. No lifting greater than 5 pounds. No  strenuous activities for three days post catheterization.   DISCHARGE DIET:  Low cholesterol diet.   DISCHARGE INSTRUCTIONS:  She was allowed to shower and instructed not to rub  but gently wash groin puncture site with mild soap and pat it dry. Also, she  was to report any problems to our office. Phone number was provided.   DISCHARGE FOLLOWUP:  Renal ultrasound scheduled on 05/09/02 at 1 p.m. in our  office, and she will have a followup appointment with Dr. Allyson Sabal in  [place] on 05/21/02 at 11:45.     Raymon Mutton, P.A.                    Nanetta Batty, M.D.    MK/MEDQ   D:  05/02/2002  T:  05/03/2002  Job:  [id number]

## 2010-08-28 NOTE — Discharge Summary (Signed)
   NAME:  Cathy Ortega, Cathy Ortega                         ACCOUNT NO.:  [id number]   MEDICAL RECORD NO.:  [id number]                   PATIENT TYPE:  INP   LOCATION:  3017                                 FACILITY:  [facility]   PHYSICIAN:  Payton Doughty, M.D.                   DATE OF BIRTH:  [birth year]   DATE OF ADMISSION:  11/30/2002  DATE OF DISCHARGE:  12/03/2002                                 DISCHARGE SUMMARY   ADMISSION DIAGNOSIS:  Herniated disk at L4-5.   COMPLICATIONS:  None.   CONDITION ON DISCHARGE:  Lying well.   PROCEDURE:  L4-5 laminectomy and diskectomy.   HISTORY OF PRESENT ILLNESS:  This is a very nice 77 year old right-handed  white lady whose history and physical is recanted on the chart. She has  basically had back pain for some time and had an increase in her pain that  was associated with increase in disk at L4-5 and she is admitted for  decompression.   PAST MEDICAL HISTORY:  Remarkable for coronary artery disease with bypass in  1998.   MEDICATIONS:  Toprol, IMDUR, aspirin, Nitrostat, Lescol, Zetia, MiraLax,  Lactulose, Aciphex.   ALLERGIES:  PLAVIX.   PHYSICAL EXAMINATION:  GENERAL: Intact.  NEUROLOGY:  Mixed right L4 and L5 radiculopathies with a foraminal disk at 4-  5 on the right.   HOSPITAL COURSE:  She was admitted and taken to the OR after ascertainment  of normal laboratory values and underwent a 4-5 diskectomy.  Postoperatively, her leg pain is markedly diminished.  She has a mild amount  of incisional pain.  Because of her coronary artery disease history, she was  in a monitored bed for two days and transferred to the floor.  She is up  walking with a walker with terrific relief of her leg pain.  Strength is  full and her incision is dry. She is being discharged home to the care of  her family with Darvocet for pain.  Her follow-up will be in the [facility] office in a couple of weeks.               Payton Doughty, M.D.    MWR/MEDQ  D:  12/03/2002  T:  12/03/2002  Job:  [id number]

## 2010-08-28 NOTE — Discharge Summary (Signed)
NAME:  Cathy Ortega, Cathy Ortega                         ACCOUNT NO.:  [id number]   MEDICAL RECORD NO.:  [id number]                   PATIENT TYPE:  INP   LOCATION:  3032                                 FACILITY:  [facility]   PHYSICIAN:  Payton Doughty, M.D.                   DATE OF BIRTH:  1923-02-05   DATE OF ADMISSION:  12/20/2003  DATE OF DISCHARGE:  12/26/2003                                 DISCHARGE SUMMARY   ADMITTING DIAGNOSES:  1.  Spondylosis at L3-4.  2.  Recurrent disc at L4-5.   DISCHARGE DIAGNOSES:  1.  Spondylosis at L3-4.  2.  Recurrent disc at L4-5.   OPERATIVE PROCEDURE:  L3-4, L4-5 laminectomy, discectomy, __________ fusion  cages, pedicle screw fixation at L3-4 and L4-5 and posterolateral  arthrodesis.   COMPLICATIONS:  None.   DISCHARGE STATUS:  ___________.   This is a very nice, 77 year old, right-handed, white lady whose history and  physical can be found on the chart.  She had a discectomy some time ago.  She has had increasing pain in her back and leg.   PAST MEDICAL HISTORY:  Remarkable for coronary artery disease.   MEDICATIONS:  Toprol, Imdur, aspirin, nystatin, Lescol, Zetia, MiraLax,  lactulose and Aciphex.   EXAMINATION:  GENERAL:  Intact.  NEUROLOGIC:  Right L4 and L5 lumbar radiculopathies.   She was admitted after normal laboratory values and underwent a L3-4, L4-5  fusion as noted above.  Postoperatively she did well.  She was monitored in  the ACCU setting for several days because of her age and prior history, but  actually got up and did fairly well.  She had a little bit of hypotension,  which resolved spontaneously.  She is now up, eating and voiding normally,  having participated in physical and occupational therapy.  Catheter is out  and vital signs are stable.  Pain is under good control with a few Percocet.  She is being discharged home in the care of her family.  Her followup will  be in the [facility] office in about  a week for  sutures.                                                Payton Doughty, M.D.    MWR/MEDQ  D:  12/26/2003  T:  12/26/2003  Job:  [id number]

## 2010-08-28 NOTE — Cardiovascular Report (Signed)
NAME:  EXILDA, WILHITE NO.:  [id number]   MEDICAL RECORD NO.:  [id number]                   PATIENT TYPE:  INP   LOCATION:  3733                                 FACILITY:  [facility]   PHYSICIAN:  Nanetta Batty, M.D.                DATE OF BIRTH:  Jan 03, 1923   DATE OF PROCEDURE:  DATE OF DISCHARGE:                              CARDIAC CATHETERIZATION   INDICATIONS FOR PROCEDURE:  The patient is a 77 year old married white  female with a history of CAD status post CABG in 1998 with a LIMA to her  LAD, and a vein graft to an OM and PDA.  She was last catheterized  03/10/1999, by Dr. Daphene Jaeger, revealing an atretic LIMA with otherwise  patent grafts and a small PDA graft with normal LV function.  She was  admitted 04/30/2002, with unstable angina.  There are no ST or T-wave  changes.  She ruled out for myocardial infarction.  She presents now for  diagnostic coronary arteriography.   DESCRIPTION OF PROCEDURE:  The patient was brought to the second floor [facility] in a postabsorptive state.  She was  premedicated with p.o. Valium and IV Versed.  Her right groin was prepped  and shaved in the usual sterile fashion.  Xylocaine, 1%, was used for local  anesthesia.  A 6-French sheath was inserted into the right femoral artery  using standard Seldinger technique.  The 6-French right and left Judkins  diagnostic catheters, along with a 6-French pigtail catheter, were used for  selective coronary angiography, left ventriculography, selective vein graft  angiography, sub-selective left internal mammary artery angiography,  supravalvular aortography, and distal abdominal aortography.  Omnipaque dye  was used for the entirety of the case.  Retrograde aortic and left  ventricular pullback pressures were recorded.   HEMODYNAMICS:  1. Aortic systolic pressure 134, diastolic pressure 65.  2. Left ventricular systolic pressure 133, end  diastolic pressure 6.   SELECTIVE CORONARY ANGIOGRAPHY:  1. Left main;  normal.  2. LAD; the LAD has an 80% calcific proximal stenosis before the first     diagonal branch.  There is a 50% segmental stenosis after this diagonal     branch.  3. Left circumflex; an 80% stenosis in the proximal portion.  4. Right coronary artery; dominant with tandem 50-60% stenoses in the mid     portion, and a 95% stenosis in the mid PDA just proximal to the vein     graft insertion.  5. LIMA to the LAD; atretic.  6. Vein graft to the diagonal branch; widely patent with retrograde fill of     the LAD and circumflex systems.  7. Vein graft to the OMs; widely patent.  8. Vein graft to the PDA; small caliber vessel with damping upon engagement     but widely patent.  There was retrograde filling of the entire  right     coronary artery with PDA injection.  9. Left ventriculography; RAO left ventriculogram was performed using 20 cc     of Omnipaque dye at 13 cc/second.  The overall LVEF was estimated at     greater than 60% without focal wall motion abnormalities.  10.      Supravalvular aortography; performed in the LAO cranial view with     20 cc of Omnipaque dye at 20 cc/second.  There was no aortic dissection.     There was no AI.  Arch vessels were intact.  11.      Distal abdominal aortography; distal abdominal aortogram was     performed using 20 cc of Omnipaque dye at 20 cc/second.  There was a 50%     proximal right renal artery stenosis.  The infrarenal abdominal aorta and     iliac bifurcation appear free of significant atherosclerotic changes.   IMPRESSION:  The patient has widely patent grafts with anatomy unchanged by  description from her previous catheterization three years ago.   PLAN:  Continued medical therapy.   ACT was measured, and the sheaths were removed.  Pressure was held on the  groin to achieve hemostasis.  The patient left the lab in stable condition.  She will be discharged  home tomorrow after ambulating later on today.                                               Nanetta Batty, M.D.    Cordelia Pen  D:  05/01/2002  T:  05/01/2002  Job:  [id number]   cc:   Cardiac Catheterization Lab   [facility]

## 2010-08-28 NOTE — Op Note (Signed)
NAME:  DELINA, KRUCZEK                         ACCOUNT NO.:  [id number]   MEDICAL RECORD NO.:  [id number]                   PATIENT TYPE:  INP   LOCATION:  3172                                 FACILITY:  [facility]   PHYSICIAN:  Payton Doughty, M.D.                   DATE OF BIRTH:  1922-09-21   DATE OF PROCEDURE:  12/20/2003  DATE OF DISCHARGE:                                 OPERATIVE REPORT   PREOPERATIVE DIAGNOSES:  Spondylosis L3-4, herniated disk L4-5.   POSTOPERATIVE DIAGNOSES:  Spondylosis L3-4, herniated disk L4-5.   PROCEDURE:  L3-4, L4-5 laminectomy and diskectomy, posterior lumbar  interbody Ray fusion cages, segmental pedicle fixation L3-4 and L4-5,  posterolateral arthrodesis L3-4, L4-5.   SURGEON:  Payton Doughty, M.D.   NURSE ASSISTANT:  [facility].   DOCTOR ASSISTANT:  Danae Orleans. Venetia Maxon, M.D.   ANESTHESIA:  General endotracheal.   PREPARATION:  Alcohol wipe.   INDICATIONS FOR PROCEDURE:  A 77 year old lady with severe spondylosis,  incapacitating back and leg pain.   DESCRIPTION OF PROCEDURE:  The patient was taken to the operating room,  smoothly intubated, placed prone on the operating table.  Following shave,  prep and drape in the usual sterile fashion, skin was infiltrated with 1%  lidocaine with 1:400,000 epinephrine. The skin was incised from mid-L2 to  mid-L5 and the lamina of L3, L4, L5 and the transverse processes of L3-L4-L5  were exposed bilaterally in the subperiosteal plane.  Intraoperative x-ray  confirmed correctness of the level.   The pars interarticularis and lamina, and inferior facet of L3-4 and  superior facets of L4-5 were removed bilaterally using the high speed drill.  The ligamentum flavum was removed.  At 3-4, there was severe spondylosis,  worse on the right than on the left. At 4-5, there was severe spondylosis,  worse on the right than on the left and recurrent herniated disk on the  right at L4-5.  Following complete decompression of the  3, 4 and 5 roots,  the Ray threaded fusion cages, 12 x 21 mm were placed at both levels.  Pedicle screws were then placed using the standard landmarks and x-ray  guidance.  Intraoperative x-ray confirmed good placement of Ray cages and  pedicle screws. They were connected by rod and capped.  The wound was  irrigated and hemostasis was assured.  Ray cages were capped, packed with  bone graft harvested from facet joints.   The allograft and DBX were mixed and placed in the inner transverse area as  a posterolateral arthrodesis. Fascia was then reapproximated with 0-Vicryl  in interrupted fashion, subcutaneous tissue was reapproximated with 0-Vicryl  in  interrupted fashion, subcuticular tissue was reapproximated with 3-0 Vicryl  interrupted fashion, skin was closed with 3-0 nylon in running, locked  fashion. Betadine, Telfa dressing was applied, made occlusive with Op-Site.  The patient returned to the recovery room in  good condition.                                               Payton Doughty, M.D.    MWR/MEDQ  D:  12/20/2003  T:  12/21/2003  Job:  [id number]

## 2010-08-28 NOTE — Discharge Summary (Signed)
[facility]  Patient:    Cathy Ortega, Cathy Ortega Visit Number: [id number] MRN: [id number]          Service Type: MED Location: 3A A313 01 Attending Physician:  Darlin Priestly Dictated by:   Colette Ribas, M.D. Admit Date:  06/13/2001 Discharge Date: 06/16/2001                             Discharge Summary  For history of presenting illness and past medical history, please see admission H&P.  HOSPITAL COURSE:  This is a 77 year old female with bronchitis and need for inpatient treatment.  She has comorbidities of coronary artery disease, hyperlipidemia, osteoarthritis and anxiety disorder.  She was admitted for aggressive nebulizer treatments including albuterol and Atrovent q.i.d. and Rocephin 1 g 24 hours.  Bactrim DS b.i.d. was continued.  The day after admission, the patient improved greatly with less shortness of breath and no fevers.  She had less rhonchi.  Vital signs remained stable. Chest x-ray negative.  UA negative.  IV antibiotics were continued as well as nebulizers.  On June 15, 2001, the patient again had less shortness of breath and less cough.  She was clear to auscultation with great improvement in breath sounds. IV antibiotics were continued that day.  Plan for discharge in the morning.  Dr. Nobie Putnam saw the patient on June 16, 2001, and felt like she was ready for discharge.  The lungs were completely clear.  DISCHARGE MEDICATIONS: 1. Continue home medications. 2. Continue Bactrim that was given.  SPECIAL INSTRUCTIONS:  See discharge sheet for details.  FOLLOWUP:  Followup with [facility] in one week.  CONDITION ON DISCHARGE:  Greatly improved per Dr. Nobie Putnam.  Please see Dr. Lars Pinks progress note for discharge physical. Dictated by:   Colette Ribas, M.D. Attending Physician:  Darlin Priestly DD:  07/04/01 TD:  07/05/01 Job: [id number]

## 2010-08-28 NOTE — Op Note (Signed)
NAME:  Cathy Ortega, Cathy Ortega                         ACCOUNT NO.:  [id number]   MEDICAL RECORD NO.:  [id number]                   PATIENT TYPE:  INP   LOCATION:  3314                                 FACILITY:  [facility]   PHYSICIAN:  Payton Doughty, M.D.                   DATE OF BIRTH:  Mar 12, 1923   DATE OF PROCEDURE:  11/30/2002  DATE OF DISCHARGE:                                 OPERATIVE REPORT   PREOPERATIVE DIAGNOSIS:  Herniated disk, L4-5 on the right.   POSTOPERATIVE DIAGNOSIS:  Herniated disk, L4-5 on the right.   OPERATIVE PROCEDURE:  L4-5 laminectomy and diskectomy.   SURGEON:  Payton Doughty, M.D.   ASSISTANT:  Hilda Lias, M.D.   ANESTHESIA:  General endotracheal.   PREPARATION:  Sterile Betadine prep and scrub with alcohol wipe.   COMPLICATIONS:  None.   BODY OF TEXT:  This is a 76 year old lady with a right L4 and L5  radiculopathy that is related to a herniated disk at L4-5.  She was taken to  the operating room and smoothly anesthetized and intubated, placed prone on  the operating table.  Following shave, prep, and drape in the usual sterile  fashion, the skin was infiltrated with 1% lidocaine and 1:400,000  epinephrine.  The skin was incised from the bottom of L4 to the top of L4  and the lamina of L4 was dissected and exposed in the subperiosteal plane.  Intraoperative x-ray confirmed correctness of the level.  Having confirmed  correctness of the level, hemisemilaminectomy of L4 was carried out on the  right side up to the top of the ligamentum flavum that was removed in a  retrograde fashion.  Lateral dissection of the L5 root and the lateral  margin of the thecal sac allowed retraction.  Retraction demonstrated a  small corner of disk protruding from the neural foramen.  It was grasped and  removed without difficulty.  The neural foramen was explored with a hook and  several pieces of disk were also found and removed.  The neural foramen was  then found to be  open.  The exploration of the L4-5 disk did not reveal any  compression of the L5 root at that point.  The wound was irrigated and  hemostasis assured.  The laminectomy defect was filled with Depo-Medrol-  soaked fat.  The fascia was reapproximated with 0 Vicryl in an interrupted  fashion, the subcutaneous tissue was reapproximated with interrupted 0  Vicryl in an interrupted fashion, and the skin was closed with 4-0 Vicryl in  a running subcuticular fashion.  Benzoin and Steri-Strips were placed and  made occlusive with Telfa and OpSite.  The patient then returned to the  recovery room in good condition.  Payton Doughty, M.D.    MWR/MEDQ  D:  11/30/2002  T:  12/01/2002  Job:  [id number]

## 2010-09-25 ENCOUNTER — Other Ambulatory Visit (HOSPITAL_COMMUNITY): Payer: Self-pay | Admitting: Internal Medicine

## 2010-09-25 DIAGNOSIS — M159 Polyosteoarthritis, unspecified: Secondary | ICD-10-CM

## 2010-09-29 ENCOUNTER — Other Ambulatory Visit (HOSPITAL_COMMUNITY): Payer: Self-pay | Admitting: Family Medicine

## 2010-09-29 DIAGNOSIS — Z139 Encounter for screening, unspecified: Secondary | ICD-10-CM

## 2010-09-30 ENCOUNTER — Ambulatory Visit (HOSPITAL_COMMUNITY)
Admission: RE | Admit: 2010-09-30 | Discharge: 2010-09-30 | Disposition: A | Discharge status: Home / Self Care | Payer: Medicare Other | Source: Ambulatory Visit | Attending: Internal Medicine | Admitting: Internal Medicine

## 2010-09-30 DIAGNOSIS — Z780 Asymptomatic menopausal state: Secondary | ICD-10-CM | POA: Insufficient documentation

## 2010-09-30 DIAGNOSIS — M949 Disorder of cartilage, unspecified: Secondary | ICD-10-CM | POA: Insufficient documentation

## 2010-09-30 DIAGNOSIS — M899 Disorder of bone, unspecified: Secondary | ICD-10-CM | POA: Insufficient documentation

## 2010-09-30 DIAGNOSIS — M159 Polyosteoarthritis, unspecified: Secondary | ICD-10-CM

## 2010-10-07 ENCOUNTER — Emergency Department (HOSPITAL_COMMUNITY): Payer: Medicare Other

## 2010-10-07 ENCOUNTER — Inpatient Hospital Stay (HOSPITAL_COMMUNITY)
Admission: EM | Admit: 2010-10-07 | Discharge: 2010-10-10 | DRG: 301 | Disposition: A | Discharge status: Home / Self Care | Payer: Medicare Other | Attending: Cardiology | Admitting: Cardiology

## 2010-10-07 ENCOUNTER — Encounter (HOSPITAL_COMMUNITY): Payer: Self-pay

## 2010-10-07 DIAGNOSIS — I748 Embolism and thrombosis of other arteries: Principal | ICD-10-CM | POA: Diagnosis present

## 2010-10-07 DIAGNOSIS — R791 Abnormal coagulation profile: Secondary | ICD-10-CM | POA: Diagnosis present

## 2010-10-07 DIAGNOSIS — I498 Other specified cardiac arrhythmias: Secondary | ICD-10-CM | POA: Diagnosis present

## 2010-10-07 DIAGNOSIS — K5900 Constipation, unspecified: Secondary | ICD-10-CM | POA: Diagnosis present

## 2010-10-07 DIAGNOSIS — I2510 Atherosclerotic heart disease of native coronary artery without angina pectoris: Secondary | ICD-10-CM | POA: Diagnosis present

## 2010-10-07 DIAGNOSIS — Z8673 Personal history of transient ischemic attack (TIA), and cerebral infarction without residual deficits: Secondary | ICD-10-CM

## 2010-10-07 DIAGNOSIS — I4891 Unspecified atrial fibrillation: Secondary | ICD-10-CM | POA: Diagnosis present

## 2010-10-07 DIAGNOSIS — D696 Thrombocytopenia, unspecified: Secondary | ICD-10-CM | POA: Diagnosis present

## 2010-10-07 DIAGNOSIS — Z951 Presence of aortocoronary bypass graft: Secondary | ICD-10-CM

## 2010-10-07 HISTORY — DX: Acquired absence of both cervix and uterus: Z90.710

## 2010-10-07 HISTORY — DX: Acquired absence of other specified parts of digestive tract: Z90.49

## 2010-10-07 HISTORY — DX: Arthrodesis status: Z98.1

## 2010-10-07 LAB — CBC
HCT: 40.2 % (ref 36.0–46.0)
MCHC: 34.3 g/dL (ref 30.0–36.0)
MCV: 85.4 fL (ref 78.0–100.0)
Platelets: 207 10*3/uL (ref 150–400)
RDW: 12.8 % (ref 11.5–15.5)

## 2010-10-07 LAB — DIFFERENTIAL
Eosinophils Absolute: 0.1 10*3/uL (ref 0.0–0.7)
Eosinophils Relative: 0 % (ref 0–5)
Lymphocytes Relative: 9 % — ABNORMAL LOW (ref 12–46)
Lymphs Abs: 1.3 10*3/uL (ref 0.7–4.0)
Monocytes Absolute: 1 10*3/uL (ref 0.1–1.0)

## 2010-10-07 LAB — URINALYSIS, ROUTINE W REFLEX MICROSCOPIC
Bilirubin Urine: NEGATIVE
Glucose, UA: NEGATIVE mg/dL
Ketones, ur: NEGATIVE mg/dL
Leukocytes, UA: NEGATIVE
Nitrite: NEGATIVE
Protein, ur: NEGATIVE mg/dL
pH: 7 (ref 5.0–8.0)

## 2010-10-07 LAB — COMPREHENSIVE METABOLIC PANEL
Alkaline Phosphatase: 88 U/L (ref 39–117)
BUN: 9 mg/dL (ref 6–23)
CO2: 27 mEq/L (ref 19–32)
Chloride: 102 mEq/L (ref 96–112)
Creatinine, Ser: 0.49 mg/dL — ABNORMAL LOW (ref 0.50–1.10)
GFR calc Af Amer: >60 mL/min (ref >60)
GFR calc non Af Amer: >60 mL/min (ref >60)
Glucose, Bld: 116 mg/dL — ABNORMAL HIGH (ref 70–99)
Potassium: 3.4 mEq/L — ABNORMAL LOW (ref 3.5–5.1)
Total Bilirubin: 0.5 mg/dL (ref 0.3–1.2)

## 2010-10-07 LAB — LIPASE, BLOOD: Lipase: 46 U/L (ref 11–59)

## 2010-10-07 MED ORDER — IOHEXOL 300 MG/ML  SOLN
75.0000 mL | Freq: Once | INTRAMUSCULAR | Status: AC | PRN
  Administered 2010-10-07: 75 mL via INTRAVENOUS

## 2010-10-08 LAB — CBC
MCH: 28.4 pg (ref 26.0–34.0)
MCHC: 33.5 g/dL (ref 30.0–36.0)
MCV: 84.8 fL (ref 78.0–100.0)
Platelets: 169 10*3/uL (ref 150–400)
RBC: 4.29 MIL/uL (ref 3.87–5.11)
RDW: 13 % (ref 11.5–15.5)

## 2010-10-08 LAB — BASIC METABOLIC PANEL
CO2: 26 mEq/L (ref 19–32)
Calcium: 7.7 mg/dL — ABNORMAL LOW (ref 8.4–10.5)
Creatinine, Ser: 0.48 mg/dL — ABNORMAL LOW (ref 0.50–1.10)
GFR calc non Af Amer: >60 mL/min (ref >60)

## 2010-10-09 LAB — CBC
MCV: 85.4 fL (ref 78.0–100.0)
Platelets: 118 10*3/uL — ABNORMAL LOW (ref 150–400)
RDW: 12.9 % (ref 11.5–15.5)
WBC: 7 10*3/uL (ref 4.0–10.5)

## 2010-10-09 LAB — BASIC METABOLIC PANEL
Calcium: 9.2 mg/dL (ref 8.4–10.5)
Chloride: 104 mEq/L (ref 96–112)
Creatinine, Ser: 0.5 mg/dL (ref 0.50–1.10)
GFR calc Af Amer: >60 mL/min (ref >60)
Sodium: 139 mEq/L (ref 135–145)

## 2010-10-10 LAB — DIFFERENTIAL
Basophils Relative: 0 % (ref 0–1)
Eosinophils Absolute: 0.1 10*3/uL (ref 0.0–0.7)
Neutrophils Relative %: 72 % (ref 43–77)

## 2010-10-10 LAB — CBC
Platelets: 141 10*3/uL — ABNORMAL LOW (ref 150–400)
RBC: 4.4 MIL/uL (ref 3.87–5.11)
WBC: 9.6 10*3/uL (ref 4.0–10.5)

## 2010-10-10 LAB — BASIC METABOLIC PANEL
CO2: 28 mEq/L (ref 19–32)
Chloride: 99 mEq/L (ref 96–112)
Potassium: 4.1 mEq/L (ref 3.5–5.1)
Sodium: 136 mEq/L (ref 135–145)

## 2010-10-12 LAB — HEPARIN INDUCED THROMBOCYTOPENIA PNL
Patient O.D.: 0.425
UFH High Dose UFH H: 0 % Release
UFH Low Dose 0.5 IU/mL: 0 % Release
UFH SRA Result: NEGATIVE
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
## 2010-11-03 ENCOUNTER — Ambulatory Visit (HOSPITAL_COMMUNITY)
Admission: RE | Admit: 2010-11-03 | Discharge: 2010-11-03 | Disposition: A | Discharge status: Home / Self Care | Payer: Medicare Other | Source: Ambulatory Visit | Attending: Family Medicine | Admitting: Family Medicine

## 2010-11-03 DIAGNOSIS — Z1231 Encounter for screening mammogram for malignant neoplasm of breast: Secondary | ICD-10-CM | POA: Insufficient documentation

## 2010-11-03 DIAGNOSIS — Z139 Encounter for screening, unspecified: Secondary | ICD-10-CM

## 2010-11-21 ENCOUNTER — Other Ambulatory Visit (HOSPITAL_COMMUNITY): Payer: Self-pay | Admitting: Family Medicine

## 2010-11-21 ENCOUNTER — Ambulatory Visit (HOSPITAL_COMMUNITY)
Admission: RE | Admit: 2010-11-21 | Discharge: 2010-11-21 | Disposition: A | Discharge status: Home / Self Care | Payer: Medicare Other | Source: Ambulatory Visit | Attending: Family Medicine | Admitting: Family Medicine

## 2010-11-21 DIAGNOSIS — S8990XA Unspecified injury of unspecified lower leg, initial encounter: Secondary | ICD-10-CM | POA: Insufficient documentation

## 2010-11-21 DIAGNOSIS — IMO0002 Reserved for concepts with insufficient information to code with codable children: Secondary | ICD-10-CM | POA: Insufficient documentation

## 2010-11-21 DIAGNOSIS — M25579 Pain in unspecified ankle and joints of unspecified foot: Secondary | ICD-10-CM | POA: Insufficient documentation

## 2010-11-21 DIAGNOSIS — T1490XA Injury, unspecified, initial encounter: Secondary | ICD-10-CM

## 2010-12-21 ENCOUNTER — Ambulatory Visit (HOSPITAL_COMMUNITY)
Admission: RE | Admit: 2010-12-21 | Discharge: 2010-12-21 | Disposition: A | Discharge status: Home / Self Care | Payer: Medicare Other | Source: Ambulatory Visit | Attending: Cardiovascular Disease | Admitting: Cardiovascular Disease

## 2010-12-21 DIAGNOSIS — I359 Nonrheumatic aortic valve disorder, unspecified: Secondary | ICD-10-CM | POA: Insufficient documentation

## 2010-12-21 DIAGNOSIS — I4891 Unspecified atrial fibrillation: Secondary | ICD-10-CM | POA: Insufficient documentation

## 2011-01-01 ENCOUNTER — Ambulatory Visit (HOSPITAL_COMMUNITY)
Admission: RE | Admit: 2011-01-01 | Discharge: 2011-01-01 | Disposition: A | Discharge status: Home / Self Care | Payer: Medicare Other | Source: Ambulatory Visit | Attending: Family Medicine | Admitting: Family Medicine

## 2011-01-01 ENCOUNTER — Other Ambulatory Visit (HOSPITAL_COMMUNITY): Payer: Self-pay | Admitting: Family Medicine

## 2011-01-01 DIAGNOSIS — M79609 Pain in unspecified limb: Secondary | ICD-10-CM

## 2011-01-01 DIAGNOSIS — S90129A Contusion of unspecified lesser toe(s) without damage to nail, initial encounter: Secondary | ICD-10-CM

## 2011-01-01 DIAGNOSIS — S92919A Unspecified fracture of unspecified toe(s), initial encounter for closed fracture: Secondary | ICD-10-CM | POA: Insufficient documentation

## 2011-01-01 DIAGNOSIS — X58XXXA Exposure to other specified factors, initial encounter: Secondary | ICD-10-CM | POA: Insufficient documentation

## 2011-03-13 DIAGNOSIS — R58 Hemorrhage, not elsewhere classified: Secondary | ICD-10-CM

## 2011-03-13 HISTORY — DX: Hemorrhage, not elsewhere classified: R58

## 2011-03-14 ENCOUNTER — Inpatient Hospital Stay (HOSPITAL_COMMUNITY)
Admission: EM | Admit: 2011-03-14 | Discharge: 2011-03-18 | DRG: 309 | Disposition: A | Discharge status: Home / Self Care | Payer: Medicare Other | Attending: Cardiovascular Disease | Admitting: Cardiovascular Disease

## 2011-03-14 ENCOUNTER — Emergency Department (HOSPITAL_COMMUNITY): Payer: Medicare Other

## 2011-03-14 ENCOUNTER — Encounter (HOSPITAL_COMMUNITY): Payer: Self-pay | Admitting: *Deleted

## 2011-03-14 DIAGNOSIS — I4891 Unspecified atrial fibrillation: Principal | ICD-10-CM

## 2011-03-14 DIAGNOSIS — E785 Hyperlipidemia, unspecified: Secondary | ICD-10-CM | POA: Diagnosis present

## 2011-03-14 DIAGNOSIS — I513 Intracardiac thrombosis, not elsewhere classified: Secondary | ICD-10-CM

## 2011-03-14 DIAGNOSIS — I482 Chronic atrial fibrillation, unspecified: Secondary | ICD-10-CM | POA: Diagnosis present

## 2011-03-14 DIAGNOSIS — Z7982 Long term (current) use of aspirin: Secondary | ICD-10-CM

## 2011-03-14 DIAGNOSIS — Z8673 Personal history of transient ischemic attack (TIA), and cerebral infarction without residual deficits: Secondary | ICD-10-CM

## 2011-03-14 DIAGNOSIS — R001 Bradycardia, unspecified: Secondary | ICD-10-CM | POA: Diagnosis not present

## 2011-03-14 DIAGNOSIS — R198 Other specified symptoms and signs involving the digestive system and abdomen: Secondary | ICD-10-CM | POA: Diagnosis present

## 2011-03-14 DIAGNOSIS — R079 Chest pain, unspecified: Secondary | ICD-10-CM | POA: Diagnosis present

## 2011-03-14 DIAGNOSIS — D735 Infarction of spleen: Secondary | ICD-10-CM | POA: Diagnosis present

## 2011-03-14 DIAGNOSIS — I480 Paroxysmal atrial fibrillation: Secondary | ICD-10-CM

## 2011-03-14 DIAGNOSIS — R0602 Shortness of breath: Secondary | ICD-10-CM | POA: Diagnosis present

## 2011-03-14 DIAGNOSIS — Z7901 Long term (current) use of anticoagulants: Secondary | ICD-10-CM

## 2011-03-14 DIAGNOSIS — I2510 Atherosclerotic heart disease of native coronary artery without angina pectoris: Secondary | ICD-10-CM | POA: Diagnosis present

## 2011-03-14 DIAGNOSIS — I5189 Other ill-defined heart diseases: Secondary | ICD-10-CM | POA: Diagnosis present

## 2011-03-14 DIAGNOSIS — Z79899 Other long term (current) drug therapy: Secondary | ICD-10-CM

## 2011-03-14 DIAGNOSIS — Z981 Arthrodesis status: Secondary | ICD-10-CM

## 2011-03-14 DIAGNOSIS — I10 Essential (primary) hypertension: Secondary | ICD-10-CM | POA: Diagnosis present

## 2011-03-14 DIAGNOSIS — K625 Hemorrhage of anus and rectum: Secondary | ICD-10-CM

## 2011-03-14 DIAGNOSIS — Z951 Presence of aortocoronary bypass graft: Secondary | ICD-10-CM

## 2011-03-14 DIAGNOSIS — I639 Cerebral infarction, unspecified: Secondary | ICD-10-CM | POA: Diagnosis present

## 2011-03-14 HISTORY — DX: Unspecified atrial fibrillation: I48.91

## 2011-03-14 HISTORY — DX: Bradycardia, unspecified: R00.1

## 2011-03-14 HISTORY — DX: Shortness of breath: R06.02

## 2011-03-14 HISTORY — DX: Atherosclerotic heart disease of native coronary artery without angina pectoris: I25.10

## 2011-03-14 HISTORY — DX: Long term (current) use of anticoagulants: Z79.01

## 2011-03-14 HISTORY — DX: Infarction of spleen: D73.5

## 2011-03-14 HISTORY — DX: Reserved for concepts with insufficient information to code with codable children: IMO0002

## 2011-03-14 HISTORY — DX: Gastro-esophageal reflux disease without esophagitis: K21.9

## 2011-03-14 HISTORY — DX: Diaphragmatic hernia without obstruction or gangrene: K44.9

## 2011-03-14 HISTORY — DX: Paroxysmal atrial fibrillation: I48.0

## 2011-03-14 HISTORY — DX: Essential (primary) hypertension: I10

## 2011-03-14 HISTORY — DX: Cerebral infarction, unspecified: I63.9

## 2011-03-14 LAB — COMPREHENSIVE METABOLIC PANEL
AST: 19 U/L (ref 0–37)
CO2: 27 mEq/L (ref 19–32)
Calcium: 9.7 mg/dL (ref 8.4–10.5)
Creatinine, Ser: 0.62 mg/dL (ref 0.50–1.10)
GFR calc Af Amer: >90 mL/min (ref >90)
GFR calc non Af Amer: 79 mL/min — ABNORMAL LOW (ref >90)
Total Protein: 7.1 g/dL (ref 6.0–8.3)

## 2011-03-14 LAB — CBC
Hemoglobin: 14 g/dL (ref 12.0–15.0)
MCHC: 33.5 g/dL (ref 30.0–36.0)
RDW: 13.1 % (ref 11.5–15.5)
WBC: 4.9 10*3/uL (ref 4.0–10.5)

## 2011-03-14 LAB — TSH: TSH: 2.585 u[IU]/mL (ref 0.350–4.500)

## 2011-03-14 LAB — POCT I-STAT TROPONIN I: Troponin i, poc: 0.01 ng/mL (ref 0.00–0.08)

## 2011-03-14 LAB — PROTIME-INR
INR: 1.43 (ref 0.00–1.49)
Prothrombin Time: 17.7 seconds — ABNORMAL HIGH (ref 11.6–15.2)

## 2011-03-14 LAB — HEMOGLOBIN A1C
Hgb A1c MFr Bld: 6.2 % — ABNORMAL HIGH (ref <5.7)
Mean Plasma Glucose: 131 mg/dL — ABNORMAL HIGH (ref <117)

## 2011-03-14 LAB — POCT I-STAT, CHEM 8
Calcium, Ion: 1.18 mmol/L (ref 1.12–1.32)
HCT: 42 % (ref 36.0–46.0)
TCO2: 27 mmol/L (ref 0–100)

## 2011-03-14 LAB — CARDIAC PANEL(CRET KIN+CKTOT+MB+TROPI)
CK, MB: 2.8 ng/mL (ref 0.3–4.0)
CK, MB: 2.7 ng/mL (ref 0.3–4.0)
Total CK: 59 U/L (ref 7–177)
Troponin I: <0.3 ng/mL (ref <0.30)

## 2011-03-14 MED ORDER — VITAMIN D3 25 MCG (1000 UNIT) PO TABS
2000.0000 [IU] | ORAL_TABLET | Freq: Every day | ORAL | Status: DC
  Administered 2011-03-14 – 2011-03-18 (×3): 2000 [IU] via ORAL
  Filled 2011-03-14 (×5): qty 2

## 2011-03-14 MED ORDER — RIVAROXABAN 15 MG PO TABS
15.0000 mg | ORAL_TABLET | Freq: Every day | ORAL | Status: DC
  Administered 2011-03-14 – 2011-03-16 (×3): 15 mg via ORAL
  Filled 2011-03-14 (×3): qty 1

## 2011-03-14 MED ORDER — ASPIRIN EC 81 MG PO TBEC: 81.0000 mg | DELAYED_RELEASE_TABLET | Freq: Every day | ORAL | Status: DC

## 2011-03-14 MED ORDER — DILTIAZEM HCL ER COATED BEADS 120 MG PO CP24
120.0000 mg | ORAL_CAPSULE | Freq: Every day | ORAL | Status: DC
  Administered 2011-03-14 – 2011-03-18 (×5): 120 mg via ORAL
  Filled 2011-03-14 (×5): qty 1

## 2011-03-14 MED ORDER — ONDANSETRON HCL 4 MG/2ML IJ SOLN
4.0000 mg | Freq: Four times a day (QID) | INTRAMUSCULAR | Status: DC | PRN
  Administered 2011-03-16 – 2011-03-17 (×2): 4 mg via INTRAVENOUS
  Filled 2011-03-14 (×2): qty 2

## 2011-03-14 MED ORDER — EZETIMIBE 10 MG PO TABS
10.0000 mg | ORAL_TABLET | Freq: Every day | ORAL | Status: DC
  Administered 2011-03-14 – 2011-03-18 (×4): 10 mg via ORAL
  Filled 2011-03-14 (×5): qty 1

## 2011-03-14 MED ORDER — ZOLPIDEM TARTRATE 5 MG PO TABS
10.0000 mg | ORAL_TABLET | Freq: Every day | ORAL | Status: DC
  Administered 2011-03-15 – 2011-03-17 (×3): 10 mg via ORAL
  Filled 2011-03-14 (×3): qty 2

## 2011-03-14 MED ORDER — ISOSORBIDE MONONITRATE ER 60 MG PO TB24
60.0000 mg | ORAL_TABLET | Freq: Every day | ORAL | Status: DC
  Administered 2011-03-14 – 2011-03-18 (×4): 60 mg via ORAL
  Filled 2011-03-14 (×5): qty 1

## 2011-03-14 MED ORDER — ASPIRIN 81 MG PO CHEW: 324.0000 mg | CHEWABLE_TABLET | ORAL | Status: AC

## 2011-03-14 MED ORDER — ASPIRIN EC 81 MG PO TBEC
81.0000 mg | DELAYED_RELEASE_TABLET | Freq: Every day | ORAL | Status: DC
  Administered 2011-03-15 – 2011-03-18 (×3): 81 mg via ORAL
  Filled 2011-03-14 (×4): qty 1

## 2011-03-14 MED ORDER — ROSUVASTATIN CALCIUM 10 MG PO TABS
10.0000 mg | ORAL_TABLET | Freq: Every day | ORAL | Status: DC
  Administered 2011-03-15 – 2011-03-18 (×4): 10 mg via ORAL
  Filled 2011-03-14 (×4): qty 1

## 2011-03-14 MED ORDER — ALPRAZOLAM 0.25 MG PO TABS: 0.2500 mg | ORAL_TABLET | Freq: Two times a day (BID) | ORAL | Status: DC | PRN

## 2011-03-14 MED ORDER — SIMVASTATIN 40 MG PO TABS
40.0000 mg | ORAL_TABLET | Freq: Every day | ORAL | Status: DC
  Administered 2011-03-14: 40 mg via ORAL
  Filled 2011-03-14: qty 1

## 2011-03-14 MED ORDER — SODIUM CHLORIDE 0.9 % IV SOLN
20.0000 mL | INTRAVENOUS | Status: DC
  Administered 2011-03-14: 20 mL via INTRAVENOUS
  Administered 2011-03-15 – 2011-03-16 (×2): 500 mL via INTRAVENOUS
  Administered 2011-03-17: 1000 mL via INTRAVENOUS
  Administered 2011-03-18: 04:00:00 via INTRAVENOUS

## 2011-03-14 MED ORDER — DILTIAZEM HCL 60 MG PO TABS: 120.0000 mg | ORAL_TABLET | Freq: Every day | ORAL | Status: DC

## 2011-03-14 MED ORDER — ASPIRIN EC 81 MG PO TBEC
81.0000 mg | DELAYED_RELEASE_TABLET | Freq: Every day | ORAL | Status: DC
Start: 2011-03-14 — End: 2011-03-14
  Filled 2011-03-14: qty 1

## 2011-03-14 MED ORDER — ASPIRIN 81 MG PO CHEW
324.0000 mg | CHEWABLE_TABLET | Freq: Once | ORAL | Status: AC
  Administered 2011-03-14: 324 mg via ORAL
  Filled 2011-03-14: qty 4

## 2011-03-14 MED ORDER — NITROGLYCERIN 2 % TD OINT
1.0000 [in_us] | TOPICAL_OINTMENT | Freq: Four times a day (QID) | TRANSDERMAL | Status: DC
  Administered 2011-03-14 – 2011-03-16 (×6): 1 [in_us] via TOPICAL
  Filled 2011-03-14: qty 1
  Filled 2011-03-14: qty 30

## 2011-03-14 MED ORDER — ACETAMINOPHEN 325 MG PO TABS
650.0000 mg | ORAL_TABLET | Freq: Four times a day (QID) | ORAL | Status: DC | PRN
  Administered 2011-03-14: 650 mg via ORAL
  Filled 2011-03-14: qty 2

## 2011-03-14 MED ORDER — POLYETHYLENE GLYCOL 3350 17 G PO PACK
17.0000 g | PACK | Freq: Every day | ORAL | Status: DC
  Administered 2011-03-14: 8.5 g via ORAL
  Administered 2011-03-15: 17 g via ORAL
  Filled 2011-03-14 (×5): qty 1

## 2011-03-14 MED ORDER — PANTOPRAZOLE SODIUM 40 MG PO TBEC
40.0000 mg | DELAYED_RELEASE_TABLET | Freq: Every day | ORAL | Status: DC
  Administered 2011-03-14 – 2011-03-18 (×4): 40 mg via ORAL
  Filled 2011-03-14 (×4): qty 1

## 2011-03-14 MED ORDER — METOPROLOL SUCCINATE ER 50 MG PO TB24
50.0000 mg | ORAL_TABLET | Freq: Every day | ORAL | Status: DC
  Administered 2011-03-14 – 2011-03-18 (×4): 50 mg via ORAL
  Filled 2011-03-14 (×5): qty 1

## 2011-03-14 MED ORDER — NITROGLYCERIN 0.4 MG SL SUBL: 0.4000 mg | SUBLINGUAL_TABLET | SUBLINGUAL | Status: DC | PRN

## 2011-03-14 NOTE — ED Notes (Signed)
Spoke with Cathy Ortega, and got okay to change pt;s bed request to telemetry private room, d/t pt has been cp free since arrival to ED

## 2011-03-14 NOTE — ED Notes (Signed)
Reports having frequent chest pains, have become more severe lately, has not been able to sleep x 2 nights. Describes pain as chest pressure. ekg being done at triage, airway intact, resp e/u.

## 2011-03-14 NOTE — ED Notes (Signed)
Awaiting meds from pharmacy  

## 2011-03-14 NOTE — ED Provider Notes (Signed)
History     CSN: [id number] Arrival date & time: 03/14/2011  8:05 AM   First MD Initiated Contact with Patient 03/14/11 0818      Chief Complaint  Patient presents with  . Chest Pain    (Consider location/radiation/quality/duration/timing/severity/associated sxs/prior treatment) HPI Reports having frequent chest pains, have become more severe lately, has not been able to sleep x 2 nights. Describes pain as chest pressure. ekg being done at triage, airway intact, resp e/u.  Patient called her cardiologist office who told her to come here.  Patient denies diaphoresis, nausea, vomiting.  Currently not having chest pain.  Pain has been intermittent.  Past Medical History  Diagnosis Date  . Hx of cholecystectomy t  . S/P hysterectomy t  . S/P lumbar fusion t  . Hypertension   . Atrial fib/flutter, transient   . Coronary artery disease     Past Surgical History  Procedure Date  . Coronary artery bypass graft     History reviewed. No pertinent family history.  History  Substance Use Topics  . Smoking status: Not on file  . Smokeless tobacco: Not on file  . Alcohol Use:     OB History    Grav Para Term Preterm Abortions TAB SAB Ect Mult Living                  Review of Systems  All other systems reviewed and are negative.    Allergies  Celebrex; Ciprofloxacin hcl; Codeine; Coumadin; Doxycycline; Famotidine; Levofloxacin; Macrolides and ketolides; Penicillins; and Plavix  Home Medications   Current Outpatient Rx  Name Route Sig Dispense Refill  . ACETAMINOPHEN 325 MG PO TABS Oral Take 650 mg by mouth every 6 (six) hours as needed. For pain or fever     . ASPIRIN 81 MG PO TABS Oral Take 81 mg by mouth daily.      Marland Kitchen VITAMIN D 1000 UNITS PO TABS Oral Take 2,000 Units by mouth daily.      Marland Kitchen DILTIAZEM HCL 120 MG PO TABS Oral Take 120 mg by mouth daily.      Marland Kitchen EZETIMIBE 10 MG PO TABS Oral Take 10 mg by mouth daily.      Marland Kitchen FLUVASTATIN SODIUM ER 80 MG PO TB24 Oral Take  80 mg by mouth daily.      . ISOSORBIDE MONONITRATE ER 60 MG PO TB24 Oral Take 60 mg by mouth daily.      Marland Kitchen METOPROLOL SUCCINATE ER 50 MG PO TB24 Oral Take 50 mg by mouth daily.      Marland Kitchen PANTOPRAZOLE SODIUM 40 MG PO TBEC Oral Take 40 mg by mouth daily.      Marland Kitchen POLYETHYLENE GLYCOL 3350 PO PACK Oral Take 17 g by mouth daily.      Marland Kitchen RIVAROXABAN 15 MG PO TABS Oral Take 15 mg by mouth daily.      Marland Kitchen ZOLPIDEM TARTRATE 10 MG PO TABS Oral Take 10 mg by mouth at bedtime.        BP 180/99  Pulse 85  Temp(Src) 98.1 F (36.7 C) (Oral)  Resp 16  SpO2 96%  Physical Exam  Nursing note and vitals reviewed. Constitutional: She is oriented to person, place, and time. She appears well-developed and well-nourished. No distress.  HENT:  Head: Normocephalic and atraumatic.  Eyes: Pupils are equal, round, and reactive to light.  Neck: Normal range of motion.  Cardiovascular: Normal rate and intact distal pulses.  An irregularly irregular rhythm present.  Date: 03/14/2011  Rate: 73  Rhythm: atrial fibrillation with occasional VPCs  QRS Axis: normal  Intervals: normal  ST/T Wave abnormalities: nonspecific T wave changes  Conduction Disutrbances:none:   Old EKG Reviewed: unchanged     Pulmonary/Chest: No respiratory distress. She has no wheezes. She has no rales.  Abdominal: Normal appearance. She exhibits no distension.  Musculoskeletal: Normal range of motion. She exhibits edema.  Neurological: She is alert and oriented to person, place, and time. No cranial nerve deficit.  Skin: Skin is warm and dry. No rash noted.  Psychiatric: She has a normal mood and affect. Her behavior is normal.    ED Course  Procedures (including critical care time)   I contacted Dr. Tresa Endo or [facility] cardiology who will come to evaluate the patient in the emergency department Labs Reviewed  PROTIME-INR - Abnormal; Notable for the following:    Prothrombin Time 17.7 (*)    All other components within normal  limits  CBC  POCT I-STAT, CHEM 8  POCT I-STAT TROPONIN I  I-STAT TROPONIN I  I-STAT, CHEM 8   Dg Chest 2 View  03/14/2011  *RADIOLOGY REPORT*  Clinical Data: Chest pain.  CHEST - 2 VIEW  Comparison: 07/07/2010  Findings: Prior CABG.  There is cardiomegaly. There is hyperinflation of the lungs compatible with COPD.  Lungs are clear. No effusions or acute bony abnormality.  IMPRESSION: Cardiomegaly, CABG.  COPD.  No active cardiopulmonary disease.  Original Report Authenticated By: Cyndie Chime, M.D.     1. Chest pain   2. Atrial fibrillation       MDM          Nelia Shi, MD 03/14/11 [phone]

## 2011-03-14 NOTE — H&P (Signed)
Cathy Ortega is an 77 y.o. female.   Chief Complaint: Shortness of breath and now chest pain.  HPI: 77 year old white widowed female, cardiology patient of Dr. York Ram and primary care patient of Dr. Assunta Found, presents to the emergency room secondary to significant shortness of breath yesterday and now that has returned today along with chest pain. Cathy Ortega is a delightful 77year old frail-appearing female with 3 children and 8 grandchildren. She has a history of coronary artery disease with bypass grafting in 1998 her last catheterization was 06/30/2010 revealing intact LIMA to the LAD with patent vein graft to the diagonal branch which back-fills her LAD.also a patent vein graft to the only him into the PDA. Her anatomy was unchanged from prior catheterizations in March. She had normal LV function.  Other history does include hypertension, hyperlipidemia and atrial fibrillation which he has been in since debris 2011 she has had 2 attempts according to cardiovert but on both TEEs prior to cardioversion she had left atrial appendage thrombus" smoke". Therefore she has not been cardioverted. She does not tolerate her atrial fib well. When Dr. Allyson Sabal last saw her, plans were to plan TEE and another attempt at cardioversion. This has not yet been scheduled.  Additional complications have been with anticoagulation. On Coumadin it was difficult to keep her therapeutic and she presented in June with a splenic infarct thought to be thromboembolic. At that point her anticoagulation was changed to Xarelto. She has been stable on this arrival to since June, but on today's visit she does complain of passing bright blood at times.  Yesterday she became significantly short of breath could hardly do any activity due to the shortness of breath but she stated she worked her way through it was able to rest through the night until approximately 3 this morning when she again awoke with shortness of breath and  this time it was associated with chest pain. She was then instructed to come to the emergency room for further evaluation.  Currently EKG with A. Fib rate controlled heart rate 73 with no acute EKG changes she does have occasional PVC.  Currently no chest pain and no shortness of breath.  Past Medical History  Diagnosis Date  . Hx of cholecystectomy t  . S/P hysterectomy t  . S/P lumbar fusion t  . Hypertension   . Atrial fib/flutter, transient   . Coronary artery disease   . SOB (shortness of breath) 03/14/2011  . CAD (coronary artery disease) 03/14/2011  . CVA (cerebral vascular accident) 03/14/2011  . PAF (paroxysmal atrial fibrillation) 03/14/2011  . Anticoagulated 03/14/2011  . Infarction splenic 03/14/2011  . Bradycardia 03/14/2011    Past Surgical History  Procedure Date  . Coronary artery bypass graft     History reviewed. No pertinent family history. Social History:  reports that she has never smoked. She has never used smokeless tobacco. She reports that she does not drink alcohol or use illicit drugs. She lives alone, the family is close by.  Allergies:  Allergies  Allergen Reactions  . Celebrex (Celecoxib)   . Ciprofloxacin Hcl Other (See Comments)    unknown  . Codeine Other (See Comments)    unknown  . Coumadin Other (See Comments)    Makes my skin bleed under the skin  . Doxycycline Other (See Comments)    unknown  . Famotidine Other (See Comments)    unknown  . Levofloxacin Other (See Comments)    unknown  . Macrolides And  Ketolides Other (See Comments)    unknown  . Penicillins   . Plavix (Clopidogrel Bisulfate)     Medications Prior to Admission  Medication Dose Route Frequency Provider Last Rate Last Dose  . 0.9 %  sodium chloride infusion  20 mL Intravenous Continuous Nelia Shi, MD 50 mL/hr at 03/14/11 0843 20 mL at 03/14/11 0843  . aspirin chewable tablet 324 mg  324 mg Oral Once Nelia Shi, MD   324 mg at 03/14/11 1191   No current  outpatient prescriptions on file as of 03/14/2011.    Results for orders placed during the hospital encounter of 03/14/11 (from the past 48 hour(s))  CBC     Status: Normal   Collection Time   03/14/11  8:33 AM      Component Value Range Comment   WBC 4.9  4.0 - 10.5 (K/uL)    RBC 4.82  3.87 - 5.11 (MIL/uL)    Hemoglobin 14.0  12.0 - 15.0 (g/dL)    HCT 47.8  29.5 - 62.1 (%)    MCV 86.7  78.0 - 100.0 (fL)    MCH 29.0  26.0 - 34.0 (pg)    MCHC 33.5  30.0 - 36.0 (g/dL)    RDW 30.8  65.7 - 84.6 (%)    Platelets 259  150 - 400 (K/uL)   PROTIME-INR     Status: Abnormal   Collection Time   03/14/11  8:33 AM      Component Value Range Comment   Prothrombin Time 17.7 (*) 11.6 - 15.2 (seconds)    INR 1.43  0.00 - 1.49    POCT I-STAT TROPONIN I     Status: Normal   Collection Time   03/14/11  8:46 AM      Component Value Range Comment   Troponin i, poc 0.01  0.00 - 0.08 (ng/mL)    Comment 3            POCT I-STAT, CHEM 8     Status: Normal   Collection Time   03/14/11  8:48 AM      Component Value Range Comment   Sodium 140  135 - 145 (mEq/L)    Potassium 4.2  3.5 - 5.1 (mEq/L)    Chloride 104  96 - 112 (mEq/L)    BUN 7  6 - 23 (mg/dL)    Creatinine, Ser 9.62  0.50 - 1.10 (mg/dL)    Glucose, Bld 99  70 - 99 (mg/dL)    Calcium, Ion 9.52  1.12 - 1.32 (mmol/L)    TCO2 27  0 - 100 (mmol/L)    Hemoglobin 14.3  12.0 - 15.0 (g/dL)    HCT 84.1  32.4 - 40.1 (%)    Dg Chest 2 View  03/14/2011  *RADIOLOGY REPORT*  Clinical Data: Chest pain.  CHEST - 2 VIEW  Comparison: 07/07/2010  Findings: Prior CABG.  There is cardiomegaly. There is hyperinflation of the lungs compatible with COPD.  Lungs are clear. No effusions or acute bony abnormality.  IMPRESSION: Cardiomegaly, CABG.  COPD.  No active cardiopulmonary disease.  Original Report Authenticated By: Cyndie Chime, M.D.    ROS: General: No colds or fevers. HEENT: No blurred vision. Skin: No bruising. Cardiovascular: Chest pain as  described. Pulmonary: Shortness of breath as described. Since her visit with Dr. Allyson Sabal she becomes dyspneic on exertion with minimal activity though  yesterday it was much worse. GI: Has had bloody drainage from her rectum twice in the  last couple of months.  GU: No hematuria or dysuria. Neuro: No lightheadedness or dizziness no syncope. Endocrine: Denies diabetes. Musculoskeletal: No acute, complaints.  Blood pressure 162/90, pulse 71, temperature 98.1 F (36.7 C), temperature source Oral, resp. rate 14, SpO2 97.00%. PE: General: Alert oriented white female currently in no acute distress pleasant affect. Skin: Warm and dry brisk capillary refill. HEENT normocephalic sclera clear. Neck: Supple with 2+ carotid uptake and no carotid bruits no JVD. Heart irregularly irregular without murmur gallop rub or click. Lungs: Clear without rales rhonchi or wheezes. Abdomen: Positive bowel sounds do not palpate liver spleen or masses, nontender. Extremities: No lower extremity edema pedal pulses are present. Neuro: Alert oriented white female x3 moves all extremities follows commands.   Assessment/Plan Patient Active Problem List  Diagnoses  . SOB (shortness of breath)  . Chest pain at rest  . CAD (coronary artery disease)  . CVA (cerebral vascular accident)  . PAF (paroxysmal atrial fibrillation)  . Anticoagulated  . Infarction splenic  . Bradycardia  Atrial thrombus  Plan. Dr. Allyson Sabal examined the patient and his plan is to admit to step down unit we will continue her [place], Plan for TEE cardioversion tomorrow or at least the TEE portion. Will check serial cardiac enzymes and EKG. Hemoglobin is stable we'll do Hemoccults to verify GI bleed. May need GI consult.  When she last saw Dr. Phillips Odor his concern was perhaps a beta blocker with adding to her shortness of breath. This may be the case versus atrial fibrillation adding to the shortness of breath. Ativan and nitroglycerin paste for  now.  INGOLD,LAURA R 03/14/2011, 10:31 AM  Agree with note written by Nada Boozer RNP  Pt well known to me witrh H/O CAD and AFIB on XARALTO. Admitted now with CP and SOB. Exam benign. Labs OK. Plan TEE DCCV tomorrow. If she still has LAA thrombus and or smoke will need to keep in AFIB.  [name] 03/15/2011 3:42 PM

## 2011-03-14 NOTE — ED Notes (Signed)
PA at bedside.

## 2011-03-14 NOTE — ED Notes (Signed)
Also reports recent rectal bleeding.

## 2011-03-15 ENCOUNTER — Encounter (HOSPITAL_COMMUNITY): Payer: Self-pay | Admitting: *Deleted

## 2011-03-15 LAB — BASIC METABOLIC PANEL
CO2: 26 mEq/L (ref 19–32)
Calcium: 9.4 mg/dL (ref 8.4–10.5)
Creatinine, Ser: 0.66 mg/dL (ref 0.50–1.10)
GFR calc non Af Amer: 78 mL/min — ABNORMAL LOW (ref >90)
Glucose, Bld: 89 mg/dL (ref 70–99)

## 2011-03-15 LAB — LIPID PANEL
LDL Cholesterol: 93 mg/dL (ref 0–99)
VLDL: 20 mg/dL (ref 0–40)

## 2011-03-15 LAB — CBC
Hemoglobin: 12.8 g/dL (ref 12.0–15.0)
MCH: 28.2 pg (ref 26.0–34.0)
MCHC: 32.7 g/dL (ref 30.0–36.0)
MCV: 86.3 fL (ref 78.0–100.0)
Platelets: 263 10*3/uL (ref 150–400)
RBC: 4.54 MIL/uL (ref 3.87–5.11)

## 2011-03-15 LAB — CARDIAC PANEL(CRET KIN+CKTOT+MB+TROPI)
CK, MB: 2.6 ng/mL (ref 0.3–4.0)
Total CK: 57 U/L (ref 7–177)
Troponin I: <0.3 ng/mL (ref <0.30)

## 2011-03-15 MED ORDER — MIDAZOLAM HCL 10 MG/2ML IJ SOLN
INTRAMUSCULAR | Status: AC
  Filled 2011-03-15: qty 2

## 2011-03-15 MED ORDER — FENTANYL CITRATE 0.05 MG/ML IJ SOLN
INTRAMUSCULAR | Status: AC
  Filled 2011-03-15: qty 2

## 2011-03-15 NOTE — Progress Notes (Signed)
Patient ID: Cathy Ortega, female   DOB: [birth year], 77 y.o.   MRN: [id number]   [facility]  Subjective: No complaints.  Would have liked an Ambien last night.   Objective: Vital signs in last 24 hours: Temp:  [97.4 F (36.3 C)-98.4 F (36.9 C)] 97.4 F (36.3 C) (12/03 0635) Pulse Rate:  [60-82] 60  (12/03 0635) Resp:  [14-18] 16  (12/03 0635) BP: (129-162)/(55-90) 161/74 mmHg (12/03 0635) SpO2:  [95 %-97 %] 97 % (12/03 0635) Weight:  [52.436 kg (115 lb 9.6 oz)] 115 lb 9.6 oz (52.436 kg) (12/02 1753) Last BM Date: 03/14/11  Intake/Output from previous day:   Intake/Output this shift:    Medications Current Facility-Administered Medications  Medication Dose Route Frequency Provider Last Rate Last Dose  . 0.9 %  sodium chloride infusion  20 mL Intravenous Continuous Nelia Shi, MD 50 mL/hr at 03/14/11 0843 20 mL at 03/14/11 0843  . acetaminophen (TYLENOL) tablet 650 mg  650 mg Oral Q6H PRN Leone Brand, NP   650 mg at 03/14/11 2053  . ALPRAZolam Prudy Feeler) tablet 0.25 mg  0.25 mg Oral BID PRN Leone Brand, NP      . aspirin chewable tablet 324 mg  324 mg Oral NOW Leone Brand, NP      . aspirin EC tablet 81 mg  81 mg Oral Daily [name], MD      . cholecalciferol (VITAMIN D) tablet 2,000 Units  2,000 Units Oral Daily Leone Brand, NP   2,000 Units at 03/14/11 1520  . diltiazem (CARDIZEM CD) 24 hr capsule 120 mg  120 mg Oral Daily [name], MD   120 mg at 03/14/11 1524  . ezetimibe (ZETIA) tablet 10 mg  10 mg Oral Daily Leone Brand, NP   10 mg at 03/14/11 1522  . isosorbide mononitrate (IMDUR) 24 hr tablet 60 mg  60 mg Oral Daily Leone Brand, NP   60 mg at 03/14/11 1523  . metoprolol (TOPROL-XL) 24 hr tablet 50 mg  50 mg Oral Daily Leone Brand, NP   50 mg at 03/14/11 1523  . nitroGLYCERIN (NITROGLYN) 2 % ointment 1 inch  1 inch Topical Q6H Leone Brand, NP   1 inch at 03/15/11 [phone]  . nitroGLYCERIN (NITROSTAT) SL  tablet 0.4 mg  0.4 mg Sublingual Q5 min PRN Leone Brand, NP      . ondansetron [facility]) injection 4 mg  4 mg Intravenous Q6H PRN Leone Brand, NP      . pantoprazole (PROTONIX) EC tablet 40 mg  40 mg Oral Daily Leone Brand, NP   40 mg at 03/14/11 1525  . polyethylene glycol (MIRALAX / GLYCOLAX) packet 17 g  17 g Oral Daily Leone Brand, NP   8.5 g at 03/14/11 1526  . Rivaroxaban (XARELTO) tablet TABS 15 mg  15 mg Oral Daily Leone Brand, NP   15 mg at 03/14/11 1525  . rosuvastatin (CRESTOR) tablet 10 mg  10 mg Oral q1800 Rolley Sims, [place]      . zolpidem [facility]) tablet 10 mg  10 mg Oral QHS Leone Brand, NP      . DISCONTD: aspirin EC tablet 81 mg  81 mg Oral Daily Leone Brand, NP      . DISCONTD: aspirin EC tablet 81 mg  81 mg Oral Daily Leone Brand, NP      .  DISCONTD: diltiazem (CARDIZEM) tablet 120 mg  120 mg Oral Daily Leone Brand, NP      . DISCONTD: simvastatin (ZOCOR) tablet 40 mg  40 mg Oral q1800 Leone Brand, NP   40 mg at 03/14/11 2057    PE: General appearance: alert, cooperative and no distress Lungs: clear to auscultation bilaterally Heart: irregularly irregular rhythm Extremities: No LEE Pulses: 2+ radials  Lab Results:   Basename 03/15/11 0700 03/14/11 0848 03/14/11 0833  WBC 5.7 -- 4.9  HGB 12.8 14.3 14.0  HCT 39.2 42.0 41.8  PLT 263 -- 259   BMET  Basename 03/15/11 0700 03/14/11 1306 03/14/11 0848  NA 141 139 140  K 3.5 3.8 4.2  CL 105 103 104  CO2 26 27 --  GLUCOSE 89 104* 99  BUN 10 7 7   CREATININE 0.66 0.62 0.90  CALCIUM 9.4 9.7 --   PT/INR  Basename 03/14/11 0833  LABPROT 17.7*  INR 1.43   Cholesterol  Basename 03/15/11 0700  CHOL 165   Cardiac Enzymes No components found with this basename: TROPONIN:3, CKMB:3  Studies/Results: CXR: 03/14/11: Findings: Prior CABG. There is cardiomegaly. There is  hyperinflation of the lungs compatible with COPD. Lungs are clear.  No effusions or acute bony  abnormality.  IMPRESSION:  Cardiomegaly, CABG.  COPD.  No active cardiopulmonary disease.  Assessment/Plan  Principal Problem:  *Chest pain at rest Active Problems:  SOB (shortness of breath)  CAD (coronary artery disease)  CVA (cerebral vascular accident)  PAF (paroxysmal atrial fibrillation)  Anticoagulated  Infarction splenic  Bradycardia  Plan:  For TEE cardioversion at 1400hrs.  BP elevated.  Will recheck after 10am meds.  Would benefit from an ACE.  Cr good and history of CAD.   LOS: 1 day    Neal Trulson W 03/15/2011 9:11 AM

## 2011-03-15 NOTE — Plan of Care (Signed)
Problem: Phase II Progression Outcomes Goal: Other Phase II Outcomes/Goals Outcome: Completed/Met Date Met:  03/15/11 TEE scheduled today

## 2011-03-15 NOTE — Progress Notes (Signed)
Pt. Seen and examined. Agree with the NP/PA-C note as written. Seen an examined. Unfortunately, the endoscopy suite ran behind and another case was put ahead of mine. We will not be able to provide anesthesia for the cardioversion today. Hopefully we can arrange with anesthesia tomorrow.   Chrystie Nose, MD Attending Cardiologist [facility]

## 2011-03-16 ENCOUNTER — Encounter (HOSPITAL_COMMUNITY): Payer: Self-pay | Admitting: *Deleted

## 2011-03-16 ENCOUNTER — Other Ambulatory Visit: Payer: Self-pay

## 2011-03-16 ENCOUNTER — Encounter (HOSPITAL_COMMUNITY)
Admission: EM | Disposition: A | Discharge status: Home / Self Care | Payer: Self-pay | Source: Home / Self Care | Attending: Cardiovascular Disease

## 2011-03-16 ENCOUNTER — Encounter (HOSPITAL_COMMUNITY): Payer: Self-pay | Admitting: Anesthesiology

## 2011-03-16 HISTORY — PX: TEE WITHOUT CARDIOVERSION: SHX5443

## 2011-03-16 SURGERY — ECHOCARDIOGRAM, TRANSESOPHAGEAL
Anesthesia: Moderate Sedation

## 2011-03-16 MED ORDER — LIDOCAINE VISCOUS 2 % MT SOLN
OROMUCOSAL | Status: DC | PRN
  Administered 2011-03-16: 1 via OROMUCOSAL

## 2011-03-16 MED ORDER — MIDAZOLAM HCL 10 MG/2ML IJ SOLN
INTRAMUSCULAR | Status: AC
  Filled 2011-03-16: qty 2

## 2011-03-16 MED ORDER — SODIUM CHLORIDE 0.9 % IJ SOLN: 3.0000 mL | INTRAMUSCULAR | Status: DC | PRN

## 2011-03-16 MED ORDER — SODIUM CHLORIDE 0.9 % IJ SOLN: 3.0000 mL | Freq: Two times a day (BID) | INTRAMUSCULAR | Status: DC

## 2011-03-16 MED ORDER — LIDOCAINE VISCOUS 2 % MT SOLN
OROMUCOSAL | Status: AC
  Filled 2011-03-16: qty 15

## 2011-03-16 MED ORDER — SODIUM CHLORIDE 0.45 % IV SOLN: INTRAVENOUS | Status: DC

## 2011-03-16 MED ORDER — MIDAZOLAM HCL 10 MG/2ML IJ SOLN
INTRAMUSCULAR | Status: DC | PRN
  Administered 2011-03-16 (×2): 1 mg via INTRAVENOUS

## 2011-03-16 MED ORDER — FENTANYL CITRATE 0.05 MG/ML IJ SOLN
INTRAMUSCULAR | Status: DC | PRN
  Administered 2011-03-16 (×2): 25 ug via INTRAVENOUS

## 2011-03-16 MED ORDER — PEG 3350-KCL-NA BICARB-NACL 420 G PO SOLR
4000.0000 mL | Freq: Once | ORAL | Status: AC
  Administered 2011-03-16: 4000 mL via ORAL
  Filled 2011-03-16: qty 4000

## 2011-03-16 MED ORDER — BUTAMBEN-TETRACAINE-BENZOCAINE 2-2-14 % EX AERO
INHALATION_SPRAY | CUTANEOUS | Status: DC | PRN
  Administered 2011-03-16: 1 via TOPICAL

## 2011-03-16 MED ORDER — RIVAROXABAN 15 MG PO TABS
15.0000 mg | ORAL_TABLET | Freq: Every day | ORAL | Status: DC
  Filled 2011-03-16: qty 1

## 2011-03-16 MED ORDER — BENZOCAINE 20 % MT SOLN
1.0000 "application " | OROMUCOSAL | Status: DC | PRN
  Filled 2011-03-16: qty 57

## 2011-03-16 MED ORDER — FENTANYL CITRATE 0.05 MG/ML IJ SOLN
INTRAMUSCULAR | Status: AC
  Filled 2011-03-16: qty 2

## 2011-03-16 MED ORDER — SODIUM CHLORIDE 0.9 % IV SOLN: 250.0000 mL | INTRAVENOUS | Status: DC | PRN

## 2011-03-16 NOTE — Interval H&P Note (Signed)
History and Physical Interval Note:  03/16/2011 8:24 AM  Cathy Ortega  has presented today for surgery, with the diagnosis of a-fib  The various methods of treatment have been discussed with the patient and family. After consideration of risks, benefits and other options for treatment, the patient has consented to  Procedure(s): TRANSESOPHAGEAL ECHOCARDIOGRAM (TEE) CARDIOVERSION as a surgical intervention .  The patients' history has been reviewed, patient examined, no change in status, stable for surgery.  I have reviewed the patients' chart and labs.  Questions were answered to the patient's satisfaction.     Corion Sherrod C

## 2011-03-16 NOTE — Op Note (Signed)
[facility]  TRANSESOPHAGEAL ECHOCARDIOGRAM (TEE) NOTE   INDICATIONS: Atrial Fibrillation  PROCEDURE:   Informed consent was obtained prior to the procedure. The risks, benefits and alternatives for the procedure were discussed and the patient comprehended these risks.  Risks include, but are not limited to, cough, sore throat, vomiting, nausea, somnolence, esophageal and stomach trauma or perforation, bleeding, low blood pressure, aspiration, pneumonia, infection, trauma to the teeth and death.    After a procedural time-out, the patient was given 2 mg versed and 50 mcg fentanyl for moderate sedation.  The oropharynx was anesthetized 20 cc of topical 1% viscous lidocaine and 1 cetacaine spray.  The transesophageal probe was inserted in the esophagus and stomach without difficulty and multiple views were obtained.  The patient was kept under observation until the patient left the procedure room.  The patient left the procedure room in stable condition.   Agitated microbubble saline contrast was not administered.  COMPLICATIONS:    There were no immediate complications.  FINDINGS:  1. LEFT VENTRICLE: The left ventricle is moderate to severely thickened, with normal function and LVEF >65%. 2. RIGHT VENTRICLE:  The right ventricle is normal in structure and function without any thrombus or masses.   3. LEFT ATRIUM:  The left atrium is dilated with spontaneous echo contrast, signifying slow velocity flow.  4. LEFT ATRIAL APPENDAGE:  The left atrial appendage is large and unilobular, there is a soft gelatinous mass in the appendage consistent with clot. There is almost no flow by pulse doppler and color doppler indicates no signal. 5. ATRIAL SEPTUM:  Intact, without ASD by color doppler. Saline contrast was not performed.  6. RIGHT ATRIUM:  The right atrium is dilated. 7. MITRAL VALVE:  The mitral valve is thickened with redundant leaflets. There is mild mitral  regurgitation with 2 distinct jets.   8. AORTIC VALVE:  The aortic valve is sclerotic with trace central AI. 9.   TRICUSPID VALVE:  The tricuspid valve is thickened with redundant valves. There is mild mitral regurgitation. 10. PULMONIC VALVE:  The pulmonic valve is normal in structure and function, with trace PI.            11. AORTIC ARCH, ASCENDING AND DESCENDING AORTA:  There is mild Grade I-II atheromatous disease of the distal aortic arch and proximal descending aorta. The ascending arch was not well            Visualized.  IMPRESSION:   1.  Persistent left atrial appendage thrombus, with low to zero velocity and no obtainable color doppler in the atrial appendage. Cardioversion is not recommended.   RECOMMENDATIONS:    1. Continued anticoagulation and rate control of atrial fibrillation. 2. Can likely be discharged home today. Follow-up with Dr. Allyson Sabal.  Time Spent Directly with the Patient:  45 minutes   Cathy Nose, MD Attending Cardiologist [facility]  03/16/2011, 8:49 AM

## 2011-03-16 NOTE — Progress Notes (Signed)
  Echocardiogram Echocardiogram Transesophageal has been performed.  Dorena Cookey 03/16/2011, 2:45 PM

## 2011-03-16 NOTE — Progress Notes (Signed)
Patient ID: Cathy Ortega, female   DOB: 09-Jun-1922, 77 y.o.   MRN: [id number]  [facility]  Subjective: Doing well.  Has intermittant tachypnic episodes.   Objective: Vital signs in last 24 hours: Temp:  [97.7 F (36.5 C)-98.4 F (36.9 C)] 98 F (36.7 C) (12/04 0740) Pulse Rate:  [63-73] 69  (12/04 0740) Resp:  [11-41] 41  (12/04 0935) BP: (93-165)/(49-94) 103/62 mmHg (12/04 0935) SpO2:  [93 %-100 %] 94 % (12/04 0935) Weight:  [53.071 kg (117 lb)] 117 lb (53.071 kg) (12/04 0455) Last BM Date: 03/15/11  Intake/Output from previous day: 12/03 0701 - 12/04 0700 In: 600 [P.O.:600] Out: 1300 [Urine:1300] Intake/Output this shift: Total I/O In: 250 [I.V.:250] Out: -   Medications Current Facility-Administered Medications  Medication Dose Route Frequency Provider Last Rate Last Dose  . 0.45 % sodium chloride infusion   Intravenous Continuous Chrystie Nose      . 0.9 %  sodium chloride infusion  20 mL Intravenous Continuous Nelia Shi, MD 50 mL/hr at 03/16/11 0806 500 mL at 03/16/11 0806  . 0.9 %  sodium chloride infusion  250 mL Intravenous PRN Chrystie Nose      . acetaminophen (TYLENOL) tablet 650 mg  650 mg Oral Q6H PRN Leone Brand, NP   650 mg at 03/14/11 2053  . ALPRAZolam Prudy Feeler) tablet 0.25 mg  0.25 mg Oral BID PRN Leone Brand, NP      . aspirin chewable tablet 324 mg  324 mg Oral NOW Leone Brand, NP      . aspirin EC tablet 81 mg  81 mg Oral Daily [name], MD   81 mg at 03/15/11 1109  . benzocaine (HURRICAINE) 20 % mouth spray 1 application  1 application Mouth/Throat PRN Chrystie Nose      . cholecalciferol (VITAMIN D) tablet 2,000 Units  2,000 Units Oral Daily Leone Brand, NP   2,000 Units at 03/15/11 1110  . diltiazem (CARDIZEM CD) 24 hr capsule 120 mg  120 mg Oral Daily [name], MD   120 mg at 03/15/11 1110  . ezetimibe (ZETIA) tablet 10 mg  10 mg Oral Daily Leone Brand, NP   10 mg at 03/15/11  1110  . isosorbide mononitrate (IMDUR) 24 hr tablet 60 mg  60 mg Oral Daily Leone Brand, NP   60 mg at 03/15/11 1110  . metoprolol (TOPROL-XL) 24 hr tablet 50 mg  50 mg Oral Daily Leone Brand, NP   50 mg at 03/15/11 1110  . nitroGLYCERIN (NITROGLYN) 2 % ointment 1 inch  1 inch Topical Q6H Leone Brand, NP   1 inch at 03/16/11 [phone]  . nitroGLYCERIN (NITROSTAT) SL tablet 0.4 mg  0.4 mg Sublingual Q5 min PRN Leone Brand, NP      . ondansetron [facility]) injection 4 mg  4 mg Intravenous Q6H PRN Leone Brand, NP      . pantoprazole (PROTONIX) EC tablet 40 mg  40 mg Oral Daily Leone Brand, NP   40 mg at 03/15/11 1111  . polyethylene glycol (MIRALAX / GLYCOLAX) packet 17 g  17 g Oral Daily Leone Brand, NP   17 g at 03/15/11 1803  . Rivaroxaban (XARELTO) tablet TABS 15 mg  15 mg Oral Daily Leone Brand, NP   15 mg at 03/15/11 1110  . rosuvastatin (CRESTOR) tablet 10 mg  10 mg Oral q1800 Lanora Manis  Enzo Montgomery, PHARMD   10 mg at 03/15/11 1804  . sodium chloride 0.9 % injection 3 mL  3 mL Intravenous Q12H Chrystie Nose      . sodium chloride 0.9 % injection 3 mL  3 mL Intravenous PRN Chrystie Nose      . zolpidem (AMBIEN) tablet 10 mg  10 mg Oral QHS Leone Brand, NP   10 mg at 03/15/11 2154  . DISCONTD: butamben-tetracaine-benzocaine (CETACAINE) spray    PRN Lisette Abu Hilty   1 spray at 03/16/11 0826  . DISCONTD: fentaNYL (SUBLIMAZE) 0.05 MG/ML injection           . DISCONTD: fentaNYL (SUBLIMAZE) injection    PRN Lisette Abu. Hilty   25 mcg at 03/16/11 0830  . DISCONTD: lidocaine (XYLOCAINE) 2 % viscous mouth solution    PRN Lisette Abu Hilty   1 application at 03/16/11 0826  . DISCONTD: midazolam (VERSED) 10 MG/2ML injection           . DISCONTD: midazolam (VERSED) 10 MG/2ML injection    PRN Lisette Abu Hilty   1 mg at 03/16/11 0830    PE: General appearance: alert, cooperative and no distress Lungs: clear to auscultation bilaterally Heart: irregularly irregular  rhythm Extremities: No LEE  Lab Results:   Basename 03/15/11 0700 03/14/11 0848 03/14/11 0833  WBC 5.7 -- 4.9  HGB 12.8 14.3 14.0  HCT 39.2 42.0 41.8  PLT 263 -- 259   BMET  Basename 03/15/11 0700 03/14/11 1306 03/14/11 0848  NA 141 139 140  K 3.5 3.8 4.2  CL 105 103 104  CO2 26 27 --  GLUCOSE 89 104* 99  BUN 10 7 7   CREATININE 0.66 0.62 0.90  CALCIUM 9.4 9.7 --   PT/INR  Basename 03/14/11 0833  LABPROT 17.7*  INR 1.43   Cholesterol  Basename 03/15/11 0700  CHOL 165   Cardiac Enzymes No components found with this basename: TROPONIN:3, CKMB:3  Studies/Results:  03/16/11:  TEE FINDINGS:  1. LEFT VENTRICLE: The left ventricle is moderate to severely thickened, with normal function and LVEF >65%. 2. RIGHT VENTRICLE: The right ventricle is normal in structure and function without any thrombus or masses.  3. LEFT ATRIUM: The left atrium is dilated with spontaneous echo contrast, signifying slow velocity flow.  4. LEFT ATRIAL APPENDAGE: The left atrial appendage is large and unilobular, there is a soft gelatinous mass in the appendage consistent with clot. There is almost no flow by pulse doppler and color doppler indicates no signal. 5. ATRIAL SEPTUM: Intact, without ASD by color doppler. Saline contrast was not performed.  6. RIGHT ATRIUM: The right atrium is dilated. 7. MITRAL VALVE: The mitral valve is thickened with redundant leaflets. There is mild mitral regurgitation with 2 distinct jets.  8. AORTIC VALVE: The aortic valve is sclerotic with trace central AI. 9. TRICUSPID VALVE: The tricuspid valve is thickened with redundant valves. There is mild mitral regurgitation.  10. PULMONIC VALVE: The pulmonic valve is normal in structure and function, with trace PI.  11. AORTIC ARCH, ASCENDING AND DESCENDING AORTA: There is mild Grade I-II atheromatous disease of the distal aortic arch and proximal descending aorta. The ascending arch was not well  Visualized.   IMPRESSION:  1. Persistent left atrial appendage thrombus, with low to zero velocity and no obtainable color doppler in the atrial appendage. Cardioversion is not recommended.    Assessment/Plan  Principal Problem:   PAF (paroxysmal atrial fibrillation)  Active Problems:  SOB (shortness of breath)   CAD (coronary artery disease)   CVA (cerebral vascular accident)   Anticoagulated  Infarction splenic   Bradycardia  Rectal bleeding.  Chest pain at rest  Plan:  S/P TEE this morning.  Left atrial appendage thrombus.  On Xarelto and ASA.  DCCV cancelled.  GI consult called for rectal bleeding.  Monitor H&H.  Do we need to hold the Xarelto?  BP stable and well controlled. Tachypnea may be related to episodes of RVR.    May need to increase Diltiazem to 240mg  CD.    LOS: 2 days    Chris Narasimhan W 03/16/2011 11:21 AM

## 2011-03-16 NOTE — Progress Notes (Signed)
Pt. Seen and examined. Agree with the NP/PA-C note as written.  Kenneth C. Hilty, MD Attending Cardiologist [facility]  

## 2011-03-16 NOTE — Consult Note (Signed)
[facility] Consult Note  Referring Provider: No ref. provider found Primary Care Physician:  Colette Ribas, MD Primary Gastroenterologist:  Dr.  Antony Contras Complaint: Rectal bleeding HPI: Cathy Ortega is an 77 y.o. white  female.  Who presents with intermittent rectal bleeding of one month's duration. She states sometimes the blood is significant in volume sometimes drips down her legs. It is interspersed with periods where she has little to no blood and usually has non-bloody bowel movements associated with passage of the blood. She has been hemodynamically stable. She is admitted foring with intermittent rectal bleeding of one month's duration. It is painless with normal accompanying BMs. She had a a colonoscopy in 2002 which showed diverticulosis and a sharp sigmoid angulation.   Past Medical History  Diagnosis Date  . Hx of cholecystectomy t  . S/P hysterectomy t  . S/P lumbar fusion t  . Hypertension   . Atrial fib/flutter, transient   . Coronary artery disease   . SOB (shortness of breath) 03/14/2011  . CAD (coronary artery disease) 03/14/2011  . CVA (cerebral vascular accident) 03/14/2011  . PAF (paroxysmal atrial fibrillation) 03/14/2011  . Anticoagulated 03/14/2011  . Infarction splenic 03/14/2011  . Bradycardia 03/14/2011  . GERD (gastroesophageal reflux disease)   . Hiatal hernia     Past Surgical History  Procedure Date  . Coronary artery bypass graft   . Bladder suspension   . Cataract extraction w/ intraocular lens  implant, bilateral     Medications Prior to Admission  Medication Dose Route Frequency Provider Last Rate Last Dose  . 0.45 % sodium chloride infusion   Intravenous Continuous Chrystie Nose      . 0.9 %  sodium chloride infusion  20 mL Intravenous Continuous Nelia Shi, MD 50 mL/hr at 03/16/11 0806 500 mL at 03/16/11 0806  . 0.9 %  sodium chloride infusion  250 mL Intravenous PRN Chrystie Nose      . acetaminophen (TYLENOL)  tablet 650 mg  650 mg Oral Q6H PRN Leone Brand, NP   650 mg at 03/14/11 2053  . ALPRAZolam Prudy Feeler) tablet 0.25 mg  0.25 mg Oral BID PRN Leone Brand, NP      . aspirin chewable tablet 324 mg  324 mg Oral Once Nelia Shi, MD   324 mg at 03/14/11 0843  . aspirin chewable tablet 324 mg  324 mg Oral NOW Leone Brand, NP      . aspirin EC tablet 81 mg  81 mg Oral Daily [name], MD   81 mg at 03/15/11 1109  . benzocaine (HURRICAINE) 20 % mouth spray 1 application  1 application Mouth/Throat PRN Chrystie Nose      . cholecalciferol (VITAMIN D) tablet 2,000 Units  2,000 Units Oral Daily Leone Brand, NP   2,000 Units at 03/15/11 1110  . diltiazem (CARDIZEM CD) 24 hr capsule 120 mg  120 mg Oral Daily [name], MD   120 mg at 03/15/11 1110  . ezetimibe (ZETIA) tablet 10 mg  10 mg Oral Daily Leone Brand, NP   10 mg at 03/15/11 1110  . isosorbide mononitrate (IMDUR) 24 hr tablet 60 mg  60 mg Oral Daily Leone Brand, NP   60 mg at 03/15/11 1110  . metoprolol (TOPROL-XL) 24 hr tablet 50 mg  50 mg Oral Daily Leone Brand, NP   50 mg at 03/15/11 1110  . nitroGLYCERIN (NITROSTAT) SL tablet 0.4 mg  0.4 mg Sublingual Q5 min PRN Leone Brand, NP      . ondansetron [facility]) injection 4 mg  4 mg Intravenous Q6H PRN Leone Brand, NP      . pantoprazole (PROTONIX) EC tablet 40 mg  40 mg Oral Daily Leone Brand, NP   40 mg at 03/15/11 1111  . polyethylene glycol (MIRALAX / GLYCOLAX) packet 17 g  17 g Oral Daily Leone Brand, NP   17 g at 03/15/11 1803  . polyethylene glycol-electrolytes (NuLYTELY/GoLYTELY) solution 4,000 mL  4,000 mL Oral Once Barrie Folk, MD      . Rivaroxaban Carlena Hurl) tablet TABS 15 mg  15 mg Oral Daily Leone Brand, NP   15 mg at 03/15/11 1110  . rosuvastatin (CRESTOR) tablet 10 mg  10 mg Oral q1800 Rolley Sims, [place]   10 mg at 03/15/11 1804  . sodium chloride 0.9 % injection 3 mL  3 mL Intravenous Q12H Chrystie Nose      . sodium  chloride 0.9 % injection 3 mL  3 mL Intravenous PRN Chrystie Nose      . zolpidem (AMBIEN) tablet 10 mg  10 mg Oral QHS Leone Brand, NP   10 mg at 03/15/11 2154  . DISCONTD: aspirin EC tablet 81 mg  81 mg Oral Daily Leone Brand, NP      . DISCONTD: aspirin EC tablet 81 mg  81 mg Oral Daily Leone Brand, NP      . DISCONTD: butamben-tetracaine-benzocaine (CETACAINE) spray    PRN Chrystie Nose   1 spray at 03/16/11 0981  . DISCONTD: diltiazem (CARDIZEM) tablet 120 mg  120 mg Oral Daily Leone Brand, NP      . DISCONTD: fentaNYL (SUBLIMAZE) 0.05 MG/ML injection           . DISCONTD: fentaNYL (SUBLIMAZE) injection    PRN Lisette Abu. Hilty   25 mcg at 03/16/11 0830  . DISCONTD: lidocaine (XYLOCAINE) 2 % viscous mouth solution    PRN Lisette Abu Hilty   1 application at 03/16/11 0826  . DISCONTD: midazolam (VERSED) 10 MG/2ML injection           . DISCONTD: midazolam (VERSED) 10 MG/2ML injection    PRN Lisette Abu Hilty   1 mg at 03/16/11 0830  . DISCONTD: nitroGLYCERIN (NITROGLYN) 2 % ointment 1 inch  1 inch Topical Q6H Leone Brand, NP   1 inch at 03/16/11 [phone]  . DISCONTD: simvastatin (ZOCOR) tablet 40 mg  40 mg Oral q1800 Leone Brand, NP   40 mg at 03/14/11 2057   No current outpatient prescriptions on file as of 03/16/2011.    Allergies:  Allergies  Allergen Reactions  . Celebrex (Celecoxib)   . Ciprofloxacin Hcl Other (See Comments)    unknown  . Codeine Other (See Comments)    unknown  . Coumadin Other (See Comments)    Makes my skin bleed under the skin  . Doxycycline Other (See Comments)    unknown  . Famotidine Other (See Comments)    unknown  . Levofloxacin Other (See Comments)    unknown  . Macrolides And Ketolides Other (See Comments)    unknown  . Penicillins   . Plavix (Clopidogrel Bisulfate)     History reviewed. No pertinent family history.  Social History:  reports that she has never smoked. She has never used smokeless tobacco. She reports that  she does not drink alcohol  or use illicit drugs.  Negative except as above  Blood pressure 103/62, pulse 69, temperature 98 F (36.7 C), temperature source Oral, resp. rate 41, height 5\' 2"  (1.575 m), weight 53.071 kg (117 lb), SpO2 94.00%. Head: Normocephalic, without obvious abnormality, atraumatic Neck: no adenopathy, no carotid bruit, no JVD, supple, symmetrical, trachea midline and thyroid not enlarged, symmetric, no tenderness/mass/nodules Resp: clear to auscultation bilaterally Cardio: regular rate and rhythm, S1, S2 normal, no murmur, click, rub or gallop GI: abdomen sft, nondistended with normal BS. No HSmegaly, mass or guard Extremities: extremities normal, atraumatic, no cyanosis or edema  Results for orders placed during the hospital encounter of 03/14/11 (from the past 48 hour(s))  CARDIAC PANEL(CRET KIN+CKTOT+MB+TROPI)     Status: Normal   Collection Time   03/14/11  1:06 PM      Component Value Range Comment   Total CK 59  7 - 177 (U/L)    CK, MB 2.8  0.3 - 4.0 (ng/mL)    Troponin I <0.30  <0.30 (ng/mL)    Relative Index RELATIVE INDEX IS INVALID  0.0 - 2.5    TSH     Status: Normal   Collection Time   03/14/11  1:06 PM      Component Value Range Comment   TSH 2.585  0.350 - 4.500 (uIU/mL)   COMPREHENSIVE METABOLIC PANEL     Status: Abnormal   Collection Time   03/14/11  1:06 PM      Component Value Range Comment   Sodium 139  135 - 145 (mEq/L)    Potassium 3.8  3.5 - 5.1 (mEq/L)    Chloride 103  96 - 112 (mEq/L)    CO2 27  19 - 32 (mEq/L)    Glucose, Bld 104 (*) 70 - 99 (mg/dL)    BUN 7  6 - 23 (mg/dL)    Creatinine, Ser 0.86  0.50 - 1.10 (mg/dL)    Calcium 9.7  8.4 - 10.5 (mg/dL)    Total Protein 7.1  6.0 - 8.3 (g/dL)    Albumin 3.6  3.5 - 5.2 (g/dL)    AST 19  0 - 37 (U/L)    ALT 12  0 - 35 (U/L)    Alkaline Phosphatase 83  39 - 117 (U/L)    Total Bilirubin 0.7  0.3 - 1.2 (mg/dL)    GFR calc non Af Amer 79 (*) >90 (mL/min)    GFR calc Af Amer >90  >90  (mL/min)   MAGNESIUM     Status: Normal   Collection Time   03/14/11  1:06 PM      Component Value Range Comment   Magnesium 1.9  1.5 - 2.5 (mg/dL)   HEMOGLOBIN V7Q     Status: Abnormal   Collection Time   03/14/11  1:06 PM      Component Value Range Comment   Hemoglobin A1C 6.2 (*) <5.7 (%)    Mean Plasma Glucose 131 (*) <117 (mg/dL)   CARDIAC PANEL(CRET KIN+CKTOT+MB+TROPI)     Status: Normal   Collection Time   03/14/11  5:52 PM      Component Value Range Comment   Total CK 62  7 - 177 (U/L)    CK, MB 2.7  0.3 - 4.0 (ng/mL)    Troponin I <0.30  <0.30 (ng/mL)    Relative Index RELATIVE INDEX IS INVALID  0.0 - 2.5    CARDIAC PANEL(CRET KIN+CKTOT+MB+TROPI)     Status: Normal   Collection Time  03/15/11 12:09 AM      Component Value Range Comment   Total CK 57  7 - 177 (U/L)    CK, MB 2.6  0.3 - 4.0 (ng/mL)    Troponin I <0.30  <0.30 (ng/mL)    Relative Index RELATIVE INDEX IS INVALID  0.0 - 2.5    CBC     Status: Normal   Collection Time   03/15/11  7:00 AM      Component Value Range Comment   WBC 5.7  4.0 - 10.5 (K/uL)    RBC 4.54  3.87 - 5.11 (MIL/uL)    Hemoglobin 12.8  12.0 - 15.0 (g/dL)    HCT 16.1  09.6 - 04.5 (%)    MCV 86.3  78.0 - 100.0 (fL)    MCH 28.2  26.0 - 34.0 (pg)    MCHC 32.7  30.0 - 36.0 (g/dL)    RDW 40.9  81.1 - 91.4 (%)    Platelets 263  150 - 400 (K/uL)   BASIC METABOLIC PANEL     Status: Abnormal   Collection Time   03/15/11  7:00 AM      Component Value Range Comment   Sodium 141  135 - 145 (mEq/L)    Potassium 3.5  3.5 - 5.1 (mEq/L)    Chloride 105  96 - 112 (mEq/L)    CO2 26  19 - 32 (mEq/L)    Glucose, Bld 89  70 - 99 (mg/dL)    BUN 10  6 - 23 (mg/dL)    Creatinine, Ser 7.82  0.50 - 1.10 (mg/dL)    Calcium 9.4  8.4 - 10.5 (mg/dL)    GFR calc non Af Amer 78 (*) >90 (mL/min)    GFR calc Af Amer >90  >90 (mL/min)   LIPID PANEL     Status: Normal   Collection Time   03/15/11  7:00 AM      Component Value Range Comment   Cholesterol 165  0 -  200 (mg/dL)    Triglycerides 956  <150 (mg/dL)    HDL 52  >21 (mg/dL)    Total CHOL/HDL Ratio 3.2      VLDL 20  0 - 40 (mg/dL)    LDL Cholesterol 93  0 - 99 (mg/dL)   OCCULT BLOOD X 1 CARD TO LAB, STOOL     Status: Normal   Collection Time   03/15/11  8:20 PM      Component Value Range Comment   Fecal Occult Bld NEGATIVE      No results found.  Assessment: Rectal bleeding Plan:  Colonoscopy tomorrow Macel Yearsley C 03/16/2011, 12:17 PM

## 2011-03-17 ENCOUNTER — Encounter (HOSPITAL_COMMUNITY)
Admission: EM | Disposition: A | Discharge status: Home / Self Care | Payer: Self-pay | Source: Home / Self Care | Attending: Cardiovascular Disease

## 2011-03-17 ENCOUNTER — Encounter (HOSPITAL_COMMUNITY): Payer: Self-pay | Admitting: Internal Medicine

## 2011-03-17 DIAGNOSIS — K625 Hemorrhage of anus and rectum: Secondary | ICD-10-CM

## 2011-03-17 DIAGNOSIS — K921 Melena: Secondary | ICD-10-CM

## 2011-03-17 DIAGNOSIS — D490 Neoplasm of unspecified behavior of digestive system: Secondary | ICD-10-CM

## 2011-03-17 DIAGNOSIS — I513 Intracardiac thrombosis, not elsewhere classified: Secondary | ICD-10-CM

## 2011-03-17 HISTORY — PX: COLONOSCOPY: SHX5424

## 2011-03-17 LAB — HEMOGLOBIN AND HEMATOCRIT, BLOOD
HCT: 39.9 % (ref 36.0–46.0)
Hemoglobin: 13.1 g/dL (ref 12.0–15.0)

## 2011-03-17 SURGERY — COLONOSCOPY
Anesthesia: Moderate Sedation

## 2011-03-17 MED ORDER — MIDAZOLAM HCL 10 MG/2ML IJ SOLN
INTRAMUSCULAR | Status: AC
  Filled 2011-03-17: qty 2

## 2011-03-17 MED ORDER — MIDAZOLAM HCL 5 MG/5ML IJ SOLN
INTRAMUSCULAR | Status: DC | PRN
  Administered 2011-03-17 (×3): 1 mg via INTRAVENOUS

## 2011-03-17 MED ORDER — FENTANYL NICU IV SYRINGE 50 MCG/ML
INJECTION | INTRAMUSCULAR | Status: DC | PRN
  Administered 2011-03-17 (×2): 25 ug via INTRAVENOUS

## 2011-03-17 MED ORDER — FENTANYL CITRATE 0.05 MG/ML IJ SOLN
INTRAMUSCULAR | Status: AC
  Filled 2011-03-17: qty 2

## 2011-03-17 NOTE — Brief Op Note (Signed)
03/14/2011 - 03/17/2011  1:39 PM  PATIENT:  Cathy Ortega  77 y.o. female  PRE-OPERATIVE DIAGNOSIS:  Rectal Bleed  POST-OPERATIVE DIAGNOSIS:  diverticulosis, perianal mass versus inflamed hemorrhoid. Please see formal operative note for details appear   PROCEDURE:  Procedure(s): COLONOSCOPY  SURGEON:  Surgeon(s): Barrie Folk, MD

## 2011-03-17 NOTE — Progress Notes (Signed)
Utilization review completed. Rushi Chasen, RN, BSN. 03/17/11  

## 2011-03-17 NOTE — Consult Note (Signed)
I spoke at length with the patient and her family. This lesion will need to be biopsied once her anticoagulation wears off.  If she is otherwise stable for D/C soon, she may see [place] in the office. Patient examined and I agree with the assessment and plan  Fiore Detjen E

## 2011-03-17 NOTE — Op Note (Signed)
[facility] [address] [place], [place]  [address]  COLONOSCOPY PROCEDURE REPORT  PATIENT:  Cathy Ortega, Cathy Ortega  MR#:  [id number] BIRTHDATE:  08/30/1922, 88 yrs. old  GENDER:  female ENDOSCOPIST:  Dorena Cookey REF. BY: PROCEDURE DATE:  03/17/2011 PROCEDURE:  Colonoscopy ASA CLASS: INDICATIONS:   rectal bleeding MEDICATIONS:  Fentanyl 50 mcg, Versed 3 mg DESCRIPTION OF PROCEDURE:   After the risks benefits and alternatives of the procedure were thoroughly explained, informed consent was obtained.  The EC-[id number] ([id number]) endoscope was introduced through the anus and advanced to the , without limitations.  The quality of the prep was .  The instrument was then slowly withdrawn as the colon was fully examined. <<PROCEDUREIMAGES>>  FINDINGS: Soft but irregular friable soft tissue mass at the anal verge, could be a prolapsed or inflamed hemorrhoid solitary rectal ulcer syndrome but could also be possibly neoplastic. Due to her being on anticoagulation and the possible vascular nature of this I did not biopsy it. Secondary finding sigmoid and descending colon diverticulosis. COMPLICATIONS:  IMPRESSION: Soft tissue anal mass versus inflamed hemorrhoid  RECOMMENDATIONS: I would recommend surgical evaluation either as an in or outpatient. Since there anticoagulation issues involved and she has had rectal bleeding for months would consider inpatient consultation but will leave to internists discretion. ______________________________ Dorena Cookey  CC:  n. eSIGNEDDorena Cookey at 03/17/2011 01:39 PM  Rada Hay, [phone]

## 2011-03-17 NOTE — Progress Notes (Signed)
Patient ID: Cathy Ortega, female   DOB: 08-22-1922, 77 y.o.   MRN: [id number]   [facility]  Subjective: No complaints.   Objective: Vital signs in last 24 hours: Temp:  [97 F (36.1 C)-97.5 F (36.4 C)] 97.4 F (36.3 C) (12/05 0457) Pulse Rate:  [66-87] 87  (12/05 0457) Resp:  [18-41] 18  (12/05 0457) BP: (103-181)/(61-83) 119/61 mmHg (12/05 0457) SpO2:  [94 %-98 %] 95 % (12/05 0457) Last BM Date: 03/17/11  Intake/Output from previous day: 12/04 0701 - 12/05 0700 In: 500 [I.V.:500] Out: 3 [Urine:3] Intake/Output this shift:    Medications Current Facility-Administered Medications  Medication Dose Route Frequency Provider Last Rate Last Dose  . 0.45 % sodium chloride infusion   Intravenous Continuous Chrystie Nose      . 0.9 %  sodium chloride infusion  20 mL Intravenous Continuous Nelia Shi, MD 50 mL/hr at 03/16/11 0806 500 mL at 03/16/11 0806  . 0.9 %  sodium chloride infusion  250 mL Intravenous PRN Chrystie Nose      . acetaminophen (TYLENOL) tablet 650 mg  650 mg Oral Q6H PRN Leone Brand, NP   650 mg at 03/14/11 2053  . ALPRAZolam Prudy Feeler) tablet 0.25 mg  0.25 mg Oral BID PRN Leone Brand, NP      . aspirin EC tablet 81 mg  81 mg Oral Daily [name], MD   81 mg at 03/15/11 1109  . benzocaine (HURRICAINE) 20 % mouth spray 1 application  1 application Mouth/Throat PRN Chrystie Nose      . cholecalciferol (VITAMIN D) tablet 2,000 Units  2,000 Units Oral Daily Leone Brand, NP   2,000 Units at 03/15/11 1110  . diltiazem (CARDIZEM CD) 24 hr capsule 120 mg  120 mg Oral Daily [name], MD   120 mg at 03/16/11 1241  . ezetimibe (ZETIA) tablet 10 mg  10 mg Oral Daily Leone Brand, NP   10 mg at 03/15/11 1110  . isosorbide mononitrate (IMDUR) 24 hr tablet 60 mg  60 mg Oral Daily Leone Brand, NP   60 mg at 03/15/11 1110  . metoprolol (TOPROL-XL) 24 hr tablet 50 mg  50 mg Oral Daily Leone Brand, NP   50 mg  at 03/15/11 1110  . nitroGLYCERIN (NITROSTAT) SL tablet 0.4 mg  0.4 mg Sublingual Q5 min PRN Leone Brand, NP      . ondansetron [facility]) injection 4 mg  4 mg Intravenous Q6H PRN Leone Brand, NP   4 mg at 03/17/11 0539  . pantoprazole (PROTONIX) EC tablet 40 mg  40 mg Oral Daily Leone Brand, NP   40 mg at 03/15/11 1111  . polyethylene glycol (MIRALAX / GLYCOLAX) packet 17 g  17 g Oral Daily Leone Brand, NP   17 g at 03/15/11 1803  . polyethylene glycol-electrolytes (NuLYTELY/GoLYTELY) solution 4,000 mL  4,000 mL Oral Once Barrie Folk, MD   4,000 mL at 03/16/11 1644  . Rivaroxaban (XARELTO) tablet TABS 15 mg  15 mg Oral QPC supper [facility], [place]      . rosuvastatin (CRESTOR) tablet 10 mg  10 mg Oral q1800 Rolley Sims, [place]   10 mg at 03/16/11 1724  . sodium chloride 0.9 % injection 3 mL  3 mL Intravenous Q12H Chrystie Nose      . sodium chloride 0.9 % injection 3 mL  3  mL Intravenous PRN Chrystie Nose      . zolpidem (AMBIEN) tablet 10 mg  10 mg Oral QHS Leone Brand, NP   10 mg at 03/16/11 2203  . DISCONTD: nitroGLYCERIN (NITROGLYN) 2 % ointment 1 inch  1 inch Topical Q6H Leone Brand, NP   1 inch at 03/16/11 [phone]  . DISCONTD: Rivaroxaban (XARELTO) tablet TABS 15 mg  15 mg Oral Daily Leone Brand, NP   15 mg at 03/16/11 1245    PE: General appearance: alert, cooperative and no distress Lungs: clear to auscultation bilaterally Heart: irregularly irregular rhythm and 1/6 soft systolic MM, 2/6 harsh intermittant diastolic MM Extremities: No LEE  Lab Results:   Basename 03/15/11 0700  WBC 5.7  HGB 12.8  HCT 39.2  PLT 263   BMET  Basename 03/15/11 0700 03/14/11 1306  NA 141 139  K 3.5 3.8  CL 105 103  CO2 26 27  GLUCOSE 89 104*  BUN 10 7  CREATININE 0.66 0.62  CALCIUM 9.4 9.7    Cholesterol  Basename 03/15/11 0700  CHOL 165   Assessment/Plan  Principal Problem:  * Chest pain at rest: resolved Active Problems:   SOB  (shortness of breath)  CAD (coronary artery disease)  CVA (cerebral vascular accident)  PAF (paroxysmal atrial fibrillation)  Anticoagulated   Infarction splenic   Bradycardia  Bright red blood per rectum (03/17/2011)  Atrial thrombus, appendage (03/17/2011)   Plan:  Patient is scheduled for a colonoscopy for today at 1300hrs.  Anticoagulated with Xarelto.  Checking Hgb this AM.  HR controlled to the 90's.  BP good   LOS: 3 days    [name],[name] 03/17/2011 9:21 AM     Patient seen and examined. Agree with assessment and plan. Results of colonoscopy noted. Soft tissue perianal mass, ? Hemorrhoid vs neoplasm.  Will obtain surgical consultation. Will hold xarelto pending surgical assessment.   [name], MD, [facility] 03/17/2011 2:29 PM

## 2011-03-17 NOTE — Progress Notes (Signed)
[facility] Progress Note  Subjective: Tolerated prep but reports continued rectal bleeding  Objective: Vital signs in last 24 hours: Temp:  [97 F (36.1 C)-97.5 F (36.4 C)] 97 F (36.1 C) (12/05 1217) Pulse Rate:  [66-87] 87  (12/05 0457) Resp:  [13-80] 17  (12/05 1320) BP: (94-181)/(45-83) 94/45 mmHg (12/05 1320) SpO2:  [93 %-100 %] 99 % (12/05 1320) Weight:  [49.896 kg (110 lb)] 110 lb (49.896 kg) (12/05 1217) Weight change:    PE: Unchanged  Lab Results: Results for orders placed during the hospital encounter of 03/14/11 (from the past 24 hour(s))  HEMOGLOBIN AND HEMATOCRIT, BLOOD     Status: Normal   Collection Time   03/17/11 10:00 AM      Component Value Range   Hemoglobin 13.1  12.0 - 15.0 (g/dL)   HCT 52.8  41.3 - 24.4 (%)    Studies/Results: Colonoscopy shows a atypical perianal lesion, soft unclear whether neoplastic or inflamed hemorrhoid or other inflammatory process.   Assessment: Perianal soft tissue mass very likely causing bleeding in this patient on anticoagulation. Differential diagnosis includes inflamed hemorrhoid, small neoplastic lesion or solitary rectal ulcer syndrome or other perianal inflammatory lesion  Plan: Surgical consult as inpatient or outpatient. Will leave to internists discretion but would favor having her seen while in the hospital. I doubt her bleeding will respond to topical therapy.    Lurline Caver C 03/17/2011, 1:40 PM

## 2011-03-17 NOTE — Consult Note (Signed)
Reason for Consult:Rectal bleeding Referring Physician: Madilyn Fireman   HPI: Cathy Ortega is an 77 y.o. female with multiple medical problems including a.fib on chronic anticoagulation. She was admitted for SOB and CP. She has also c/o BRBPR mostly on an intermittent basis, though sometimes severe. She has been seen by GI who performed colonoscopy today. Their finding was that of a lesion at the anal verge, possibly representing either hemorrhoid, neoplasm, or ulcer. Lesion not biopsied due to anticoagulation. Surgery consult requested inpatient. Pt reports hx of some hemorrhoids with residual tags but denies pain with BMs.   Past Medical History:  Past Medical History  Diagnosis Date  . Hx of cholecystectomy t  . S/P hysterectomy t  . S/P lumbar fusion t  . Hypertension   . Atrial fib/flutter, transient   . Coronary artery disease   . SOB (shortness of breath) 03/14/2011  . CAD (coronary artery disease) 03/14/2011  . CVA (cerebral vascular accident) 03/14/2011  . PAF (paroxysmal atrial fibrillation) 03/14/2011  . Anticoagulated 03/14/2011  . Infarction splenic 03/14/2011  . Bradycardia 03/14/2011  . GERD (gastroesophageal reflux disease)   . Hiatal hernia     Surgical History:  Past Surgical History  Procedure Date  . Coronary artery bypass graft   . Bladder suspension   . Cataract extraction w/ intraocular lens  implant, bilateral   . Tee without cardioversion 03/16/2011    Procedure: TRANSESOPHAGEAL ECHOCARDIOGRAM (TEE);  Surgeon: Chrystie Nose;  Location: [facility];  Service: Cardiovascular;  Laterality: N/A;    Family History: History reviewed. No pertinent family history.  Social History:  reports that she has never smoked. She has never used smokeless tobacco. She reports that she does not drink alcohol or use illicit drugs.  Allergies:  Allergies  Allergen Reactions  . Celebrex (Celecoxib)   . Ciprofloxacin Hcl Other (See Comments)    unknown  . Codeine Other (See  Comments)    unknown  . Coumadin Other (See Comments)    Makes my skin bleed under the skin  . Doxycycline Other (See Comments)    unknown  . Famotidine Other (See Comments)    unknown  . Levofloxacin Other (See Comments)    unknown  . Macrolides And Ketolides Other (See Comments)    unknown  . Penicillins    Plavix     Medications: I have reviewed the patient's current medications.  ROS: See HPI for pertinent findings, otherwise complete 10 system review negative.  Physical Exam: Blood pressure 114/76, pulse 87, temperature 97 F (36.1 C), temperature source Oral, resp. rate 13, height 5\' 2"  (1.575 m), weight 110 lb (49.896 kg), SpO2 99.00%. Abdomen: soft, benign Rectal: Soft external tags, NT. DRE finds no blood in vault. Palpable lesion at verge, NT.   Labs: CBC  Basename 03/17/11 1000 03/15/11 0700  WBC -- 5.7  HGB 13.1 12.8  HCT 39.9 39.2  PLT -- 263   MET  Basename 03/15/11 0700  NA 141  K 3.5  CL 105  CO2 26  GLUCOSE 89  BUN 10  CREATININE 0.66  CALCIUM 9.4   No results found for this basename: PROT,ALBUMIN,AST,ALT,ALKPHOS,BILITOT,BILIDIR,IBILI,LIPASE in the last 72 hours PT/INR No results found for this basename: LABPROT:2,INR:2 in the last 72 hours ABG No results found for this basename: PHART:2,PCO2:2,PO2:2,HCO3:2 in the last 72 hours   Assessment/Plan: Rectal bleeding likely from new finding of anal lesion. It seems to be on an intermittent basis as a result of anticoagulation, but Hgb very stable. Will  let MD review pictures and case. Pt may be able to be seen as an outpt for proper exam, anoscopy, as long as she could come off the Xarelto for an appropriate period of time.  Marianna Fuss 03/17/2011, 2:33 PM

## 2011-03-17 NOTE — Clinical Documentation Improvement (Signed)
CHEST PAIN DOCUMENTATION CLARIFICATION QUERY  THIS DOCUMENT IS NOT A PERMANENT PART OF THE MEDICAL RECORD  TO RESPOND TO THE THIS QUERY, FOLLOW THE INSTRUCTIONS BELOW:  1. If needed, update documentation for the patient's encounter via the notes activity.  2. Access this query again and click edit on the [employer].  3. After updating, or not, click F2 to complete all highlighted (required) fields concerning your review. Select "additional documentation in the medical record" OR "no additional documentation provided".  4. Click Sign note button.  5. The deficiency will fall out of your InBasket *Please let us know if you are not able to compete this workflow by phone or e-mail (listed below).          03/17/11  Dear Cathy Ortega  In an effort to better capture your patient's severity of illness, reflect appropriate length of stay and utilization of resources, a review of the patient medical record has revealed the following indicators.    Based on your clinical judgment, please clarify and document in a progress note and/or discharge summary the clinical condition associated with the following supporting information:   Based on your clinical judgment, please clarify cause of Chest Pain:  _______Unstable Angina  _______Musculoskeletal Chest Pain _______Pleuritic Chest Pain _______GERD _______Costochondritis _______Dressler's syndrome _______Chest wall pain _______Other Condition_______________________ _______Cannot Clinically Determine  Supporting Information:   Admitted 03/14/11 with chest pain at rest. History of paroxysmal atrial fibrillation noted per H&P. TEE on 03/16/11 revealed persistent left atrial appendage, thrombus.   You may use possible, probable, or suspect with inpatient documentation. possible, probable, suspected diagnoses MUST be documented at the time of discharge  Reviewed:  Pt admitted with chest pain. Query posted for causes of  chest pain.  Final drg went to a-fib.  Query has been administratively closed in [employer].  Mathis Dad. 04/08/11. Thank You,  Marciano Sequin,  Clinical Documentation Specialist:  Pager:[phone]  Health Information Management [place]

## 2011-03-18 ENCOUNTER — Encounter (HOSPITAL_COMMUNITY): Payer: Self-pay | Admitting: Gastroenterology

## 2011-03-18 MED ORDER — ALBUTEROL SULFATE HFA 108 (90 BASE) MCG/ACT IN AERS
2.0000 | INHALATION_SPRAY | Freq: Four times a day (QID) | RESPIRATORY_TRACT | Status: DC | PRN
  Filled 2011-03-18: qty 6.7

## 2011-03-18 MED ORDER — RIVAROXABAN 10 MG PO TABS: 10.0000 mg | ORAL_TABLET | Freq: Every day | ORAL | Status: DC

## 2011-03-18 MED ORDER — ALBUTEROL SULFATE HFA 108 (90 BASE) MCG/ACT IN AERS: 2.0000 | INHALATION_SPRAY | Freq: Four times a day (QID) | RESPIRATORY_TRACT | Status: DC | PRN

## 2011-03-18 MED ORDER — RIVAROXABAN 15 MG PO TABS
15.0000 mg | ORAL_TABLET | Freq: Every day | ORAL | Status: DC
  Administered 2011-03-18: 15 mg via ORAL
  Filled 2011-03-18: qty 1

## 2011-03-18 NOTE — Progress Notes (Signed)
Patient ID: Cathy Ortega, female   DOB: 01-25-1923, 77 y.o.   MRN: [id number]   [facility]  Subjective: Pt feels a little week.  A little bleeding from rectum.  Objective: Vital signs in last 24 hours: Temp:  [96.7 F (35.9 C)-97.4 F (36.3 C)] 96.7 F (35.9 C) (12/06 0455) Pulse Rate:  [65-81] 81  (12/06 0455) Resp:  [13-80] 18  (12/06 0455) BP: (94-157)/(45-81) 114/75 mmHg (12/06 0455) SpO2:  [93 %-100 %] 95 % (12/06 0455) Weight:  [49.896 kg (110 lb)] 110 lb (49.896 kg) (12/05 1217) Last BM Date: 03/17/11  Intake/Output from previous day: 12/05 0701 - 12/06 0700 In: 2471.7 [P.O.:840; I.V.:1631.7] Out: -  Intake/Output this shift:    Medications Current Facility-Administered Medications  Medication Dose Route Frequency Provider Last Rate Last Dose  . 0.9 %  sodium chloride infusion  20 mL Intravenous Continuous Nelia Shi, MD 50 mL/hr at 03/18/11 0356    . acetaminophen (TYLENOL) tablet 650 mg  650 mg Oral Q6H PRN Leone Brand, NP   650 mg at 03/14/11 2053  . ALPRAZolam Prudy Feeler) tablet 0.25 mg  0.25 mg Oral BID PRN Leone Brand, NP      . aspirin EC tablet 81 mg  81 mg Oral Daily [name], MD   81 mg at 03/17/11 1019  . cholecalciferol (VITAMIN D) tablet 2,000 Units  2,000 Units Oral Daily Leone Brand, NP   2,000 Units at 03/15/11 1110  . diltiazem (CARDIZEM CD) 24 hr capsule 120 mg  120 mg Oral Daily [name], MD   120 mg at 03/17/11 1019  . ezetimibe (ZETIA) tablet 10 mg  10 mg Oral Daily Leone Brand, NP   10 mg at 03/17/11 1019  . isosorbide mononitrate (IMDUR) 24 hr tablet 60 mg  60 mg Oral Daily Leone Brand, NP   60 mg at 03/17/11 1019  . metoprolol (TOPROL-XL) 24 hr tablet 50 mg  50 mg Oral Daily Leone Brand, NP   50 mg at 03/17/11 1019  . nitroGLYCERIN (NITROSTAT) SL tablet 0.4 mg  0.4 mg Sublingual Q5 min PRN Leone Brand, NP      . ondansetron [facility]) injection 4 mg  4 mg Intravenous Q6H PRN  Leone Brand, NP   4 mg at 03/17/11 0539  . pantoprazole (PROTONIX) EC tablet 40 mg  40 mg Oral Daily Leone Brand, NP   40 mg at 03/17/11 1753  . polyethylene glycol (MIRALAX / GLYCOLAX) packet 17 g  17 g Oral Daily Leone Brand, NP   17 g at 03/15/11 1803  . rosuvastatin (CRESTOR) tablet 10 mg  10 mg Oral q1800 Rolley Sims, [place]   10 mg at 03/17/11 1753  . zolpidem (AMBIEN) tablet 10 mg  10 mg Oral QHS Leone Brand, NP   10 mg at 03/17/11 2201  . DISCONTD: 0.45 % sodium chloride infusion   Intravenous Continuous Chrystie Nose      . DISCONTD: 0.9 %  sodium chloride infusion  250 mL Intravenous PRN Chrystie Nose      . DISCONTD: benzocaine (HURRICAINE) 20 % mouth spray 1 application  1 application Mouth/Throat PRN Chrystie Nose      . DISCONTD: fentaNYL (SUBLIMAZE) 0.05 MG/ML injection           . DISCONTD: fentaNYL NICU IV Syringe 50 mcg/mL    PRN Barrie Folk, MD  25 mcg at 03/17/11 1311  . DISCONTD: midazolam (VERSED) 10 MG/2ML injection           . DISCONTD: midazolam (VERSED) 5 MG/5ML injection    PRN Barrie Folk, MD   1 mg at 03/17/11 1314  . DISCONTD: Rivaroxaban (XARELTO) tablet TABS 15 mg  15 mg Oral QPC supper [facility], [place]      . DISCONTD: sodium chloride 0.9 % injection 3 mL  3 mL Intravenous Q12H Chrystie Nose      . DISCONTD: sodium chloride 0.9 % injection 3 mL  3 mL Intravenous PRN Chrystie Nose        PE: General appearance: alert, cooperative and no distress Lungs: clear to auscultation bilaterally Heart: irregularly irregular rhythm Extremities: No LEE Pulses: 2+ and symmetric radials   Lab Results:   Basename 03/17/11 1000  WBC --  HGB 13.1  HCT 39.9  PLT --     Studies/Results:    Assessment/Plan  Principal Problem:  *Chest pain at rest Active Problems:  SOB (shortness of breath)  CAD (coronary artery disease)  CVA (cerebral vascular accident)  PAF (paroxysmal atrial fibrillation)   Anticoagulated  Infarction splenic  Bradycardia  Bright red blood per rectum  Atrial thrombus, appendage  Plan:   Xarelto half life 11-13 hrs in elderly pts.  If biopsy completed as outpt, Xarelto needs to be restarted today.  Patient requests the procedure be completed now while inpatient.  I called surgery.  Will start heparin per pharmacy and recheck CBC in AM.  If surgery can not do the procedure, we can DC home today on Xarelto with OP H&H.   Addendum:  Surgery has too many emergent cases to be able to perform a biopsy as an inpatient.  Patient can be DCd home on Xarelto and they will arrange an appointment. She will need to hold the Xarelto two days prior.  She should probably be bridge with heparin during the two days.   LOS: 4 days    [name],[name] 03/18/2011 9:29 AM  I have seen and examined the patient along with Wilburt Finlay, PA.  I have reviewed the chart, notes and new data.  I agree with Bryan's note.   Key new complaints: None  Key examination changes: None.  Normal exam Key new findings / data: H/H stable., remains in Afib  PLAN: Unfortunately, Cathy Ortega was admitted for planned TEE/DCCV - instead, the LAA thrombus is persistent & she has had GI bleed.  Rectal lesion noted in colonoscopy.  Will need biopsy - unfortunately will not be able to perform today or tomorrow.   We will discharge her this afternoon & restart Xarelto -- this will need to be held with likley Lovenox bridging prior to biopsy.  We will schedule her to see our Pharm-d to set this up.   She continues to have these "difficulty breathing" spells -- I am not sure what to make of them.  Have never tried inhalers - will Rx Albuterol INH.   Marykay Lex, M.D., M.S. [facility] [address] [place], [place]  [address]  [phone]  03/18/2011 4:29 PM

## 2011-03-18 NOTE — Discharge Summary (Signed)
Physician Discharge Summary  Patient ID: Cathy Ortega MRN: [id number] DOB/AGE: January 28, 1923 77 y.o.  Admit date: 03/14/2011 Discharge date: 03/18/2011  Admission Diagnoses:  Discharge Diagnoses:  Principal Problem:  *Chest pain at rest Active Problems:  SOB (shortness of breath)  CAD (coronary artery disease)  CVA (cerebral vascular accident)  PAF (paroxysmal atrial fibrillation)  Anticoagulated  Infarction splenic  Bradycardia  Bright red blood per rectum  Atrial thrombus, appendage   Discharged Condition: stable  Hospital Course:  Cathy Ortega is a delightful 77year old frail-appearing female with 3 children and 8 grandchildren. She has a history of coronary artery disease with bypass grafting in 1998 her last catheterization was 06/30/2010 revealing intact LIMA to the LAD with patent vein graft to the diagonal branch which back-fills her LAD.also a patent vein graft to the only him into the PDA. Her anatomy was unchanged from prior catheterizations in March. She had normal LV function.  Other history does include hypertension, hyperlipidemia and atrial fibrillation which he has been in since  2011 she has had 2 attempts  to cardiovert but on both TEEs prior to cardioversion she had left atrial appendage thrombus" smoke". Therefore she has not been cardioverted. She does not tolerate her atrial fib well. When Cathy Ortega last saw her, plans were to plan TEE and another attempt at cardioversion.    Additional complications have been with anticoagulation. On Coumadin it was difficult to keep her therapeutic and she presented in June with a splenic infarct thought to be thromboembolic. At that point her anticoagulation was changed to Xarelto. She has been stable on this since June, but on today's visit she does complain of passing bright blood at times.   Prior to admission she became significantly short of breath could hardly do any activity due to the shortness of breath but she stated she  worked her way through it was able to rest through the night until approximately 3 the morning of admission when she again awoke with shortness of breath and this time it was associated with chest pain. She was then instructed to come to the emergency room for further evaluation.  EKG revealed A. Fib rate controlled heart rate 73 with no acute EKG changes she does have occasional PVC.  Patient was scheduled for TE cardioversion. This was attempted on Tuesday of this week. However the patient was found to have thrombus and atrial appendage. The cardioversion was aborted and she was continued on XARELTO.    Patient was noted to have bright red blood per rectum. GI consult was requested patient was subsequently taken for colonoscopy. This revealed a soft tissue mass at the anal verge. Surgical consult was requested and will be followed up as outpatient for biopsy.  Patient's hemoglobin has remained stable.  The patient be discharged on  XARELTO. She has contact information for general surgery.  They've requested she contact them for an appointment. She'll need to hold her Zaroxolyn for 2 days prior to the biopsy. She'll be set up for appointment with Belenda Cruise at the [facility] for anticoagulation management of Xarelto prior to biopsy.  Consults:gastroenterology, general surgery  Significant Diagnostic Studies: Colonoscopy, 03/17/2011: Perianal soft tissue mass very likely causing bleeding in this patient on anticoagulation. Differential diagnosis includes inflamed hemorrhoid, small neoplastic lesion or solitary rectal ulcer syndrome or other perianal inflammatory lesion  Transesophageal echocardiogram, 03/16/2011  FINDINGS:  1. LEFT VENTRICLE: The left ventricle is moderate to severely thickened, with normal function and LVEF >65%.  2. RIGHT VENTRICLE: The right ventricle is normal in structure and function without any thrombus or masses.  3. LEFT ATRIUM: The left atrium is  dilated with spontaneous echo contrast, signifying slow velocity flow.  4. LEFT ATRIAL APPENDAGE: The left atrial appendage is large and unilobular, there is a soft gelatinous mass in the appendage consistent with clot. There is almost no flow by pulse doppler and color doppler indicates no signal. 5. ATRIAL SEPTUM: Intact, without ASD by color doppler. Saline contrast was not performed.  6. RIGHT ATRIUM: The right atrium is dilated. 7. MITRAL VALVE: The mitral valve is thickened with redundant leaflets. There is mild mitral regurgitation with 2 distinct jets.  8. AORTIC VALVE: The aortic valve is sclerotic with trace central AI.       9. TRICUSPID VALVE: The tricuspid valve is thickened with redundant valves. There  is mild mitral regurgitation.        10. PULMONIC VALVE: The pulmonic valve is normal in structure and function, with  trace PI.         11. AORTIC ARCH, ASCENDING AND DESCENDING AORTA: There is mild  Grade I-II atheromatous disease of the distal aortic arch and proximal   descending aorta. The ascending arch was not well   Visualized.   Discharge Exam: Blood pressure 128/70, pulse 68, temperature 97.9 F (36.6 C), temperature source Oral, resp. rate 19, height 5\' 2"  (1.575 m), weight 49.896 kg (110 lb), SpO2 97.00%.   Disposition: Home or Self Care  Discharge Orders    Future Orders Please Complete By Expires   Diet - low sodium heart healthy      Increase activity slowly        Current Discharge Medication List    START taking these medications   Details  albuterol (PROVENTIL HFA;VENTOLIN HFA) 108 (90 BASE) MCG/ACT inhaler Inhale 2 puffs into the lungs every 6 (six) hours as needed for wheezing or shortness of breath. Qty: 1 Inhaler, Refills: 3      CONTINUE these medications which have NOT CHANGED   Details  acetaminophen (TYLENOL) 325 MG tablet Take 650 mg by mouth every 6 (six) hours as needed. For pain or fever     aspirin 81 MG tablet Take 81 mg by mouth  daily.      cholecalciferol (VITAMIN D) 1000 UNITS tablet Take 2,000 Units by mouth daily.      diltiazem (CARDIZEM) 120 MG tablet Take 120 mg by mouth daily.      ezetimibe (ZETIA) 10 MG tablet Take 10 mg by mouth daily.      fluvastatin XL (LESCOL XL) 80 MG 24 hr tablet Take 80 mg by mouth daily.      isosorbide mononitrate (IMDUR) 60 MG 24 hr tablet Take 60 mg by mouth daily.      metoprolol (TOPROL-XL) 50 MG 24 hr tablet Take 50 mg by mouth daily.      pantoprazole (PROTONIX) 40 MG tablet Take 40 mg by mouth daily.      polyethylene glycol (MIRALAX / GLYCOLAX) packet Take 17 g by mouth daily.      Rivaroxaban (XARELTO) 15 MG TABS tablet Take 15 mg by mouth daily.      zolpidem (AMBIEN) 10 MG tablet Take 10 mg by mouth at bedtime.         Follow-up Information    Follow up with GOLDING,JOHN CABOT today.      Call Liz Malady, MD. (Appointment for rectal lesion. WIll need to be  off Xarelto 2 days prior to appointment)    Contact information:   [facility] [address] [place] [address] [phone]       Call to follow up. (Call to set up appointment with Belenda Cruise at [facility] for management  of Xarelto to Lovenox bridging prior to biopsy.)    Contact information:   [phone]         Signed: Dwana Melena 03/18/2011, 5:31 PM   I saw Mrs. Molyneux today prior to her discharge.  She remains hemodynamically stable, Hgb stable.   I agree with Mr. Jasper Riling discharge summary. Since our surgical colleagues will not be able to schedule her for biopsy until after the weekend, we all feel that it is best to discharge her & make arrangements for the biopsy to be done as an outpatient.  Will need to set up anticoagulation coverage / bridging since the LAA thrombus is still present.  We will initiate a therapeutic trial with inhaled Albuterol prn for her "dyspnea" spells.  She has a f/u appt  scheduled with Dr. Joselyn Arrow on 12/19 & will be scheduled to meet with Phylis Bougie, Pharm-D at [facility] to plan her anticoagulation bridging regimen.  Marykay Lex, M.D., M.S. [facility] [address] [place], [place]  [address]  [phone]  03/18/2011 5:40 PM

## 2011-03-18 NOTE — Progress Notes (Signed)
Pt. Discharged home via wheelchair with family. IV intact and discontinued. Discharge instructions and patient education complete. Patient and family did not have any further questions. No s/s of distress.   Dion Saucier

## 2011-03-26 ENCOUNTER — Other Ambulatory Visit (INDEPENDENT_AMBULATORY_CARE_PROVIDER_SITE_OTHER): Payer: Self-pay | Admitting: Surgery

## 2011-03-26 ENCOUNTER — Encounter (INDEPENDENT_AMBULATORY_CARE_PROVIDER_SITE_OTHER): Payer: Self-pay | Admitting: Surgery

## 2011-03-26 ENCOUNTER — Ambulatory Visit (INDEPENDENT_AMBULATORY_CARE_PROVIDER_SITE_OTHER): Payer: Medicare Other | Admitting: Surgery

## 2011-03-26 DIAGNOSIS — K602 Anal fissure, unspecified: Secondary | ICD-10-CM

## 2011-03-26 DIAGNOSIS — R198 Other specified symptoms and signs involving the digestive system and abdomen: Secondary | ICD-10-CM

## 2011-03-26 DIAGNOSIS — K6289 Other specified diseases of anus and rectum: Secondary | ICD-10-CM | POA: Insufficient documentation

## 2011-03-26 NOTE — Patient Instructions (Signed)
Use Diltiezam cream for anal pain

## 2011-03-26 NOTE — Progress Notes (Signed)
Chief Complaint:  Painful bottom and small masses noted at the anorectal junction.  History of Present Illness:  Cathy Ortega is an 77 y.o. female who had a colonoscopy a few weeks ago. I saw the pictures that her son brought with her and she has some small white excrescences at the dentate line. They did not biopsy these because she was anticoagulated and at that time. She's also been having quite a bit of pain she said it hurts when she sits down. She also noted quite a bit of bleeding which he had a bowel movement fillet fillet with pain.  Past Medical History  Diagnosis Date  . Hx of cholecystectomy t  . S/P hysterectomy t  . S/P lumbar fusion t  . Hypertension   . Atrial fib/flutter, transient   . Coronary artery disease   . SOB (shortness of breath) 03/14/2011  . CAD (coronary artery disease) 03/14/2011  . CVA (cerebral vascular accident) 03/14/2011  . PAF (paroxysmal atrial fibrillation) 03/14/2011  . Anticoagulated 03/14/2011  . Infarction splenic 03/14/2011  . Bradycardia 03/14/2011  . GERD (gastroesophageal reflux disease)   . Hiatal hernia   . Heart murmur   . Arthritis   . Thyroid disease     Past Surgical History  Procedure Date  . Coronary artery bypass graft   . Bladder suspension   . Cataract extraction w/ intraocular lens  implant, bilateral   . Tee without cardioversion 03/16/2011    Procedure: TRANSESOPHAGEAL ECHOCARDIOGRAM (TEE);  Surgeon: Chrystie Nose;  Location: [facility] ENDOSCOPY;  Service: Cardiovascular;  Laterality: N/A;  . Colonoscopy 03/17/2011    Procedure: COLONOSCOPY;  Surgeon: Barrie Folk, MD;  Location: [facility] ENDOSCOPY;  Service: Endoscopy;  Laterality: N/A;  . Cholecystectomy   . Eye surgery   . Back surgery   . Abdominal hysterectomy 1974    Medications Prior to Admission  Medication Sig Dispense Refill  . acetaminophen (TYLENOL) 325 MG tablet Take 650 mg by mouth every 6 (six) hours as needed. For pain or fever       . albuterol (PROVENTIL  HFA;VENTOLIN HFA) 108 (90 BASE) MCG/ACT inhaler Inhale 2 puffs into the lungs every 6 (six) hours as needed for wheezing or shortness of breath.  1 Inhaler  3  . aspirin 81 MG tablet Take 81 mg by mouth daily.        . cholecalciferol (VITAMIN D) 1000 UNITS tablet Take 2,000 Units by mouth daily.        Marland Kitchen diltiazem (CARDIZEM) 120 MG tablet Take 120 mg by mouth daily.        Marland Kitchen ezetimibe (ZETIA) 10 MG tablet Take 10 mg by mouth daily.        . fluvastatin XL (LESCOL XL) 80 MG 24 hr tablet Take 80 mg by mouth daily.        . isosorbide mononitrate (IMDUR) 60 MG 24 hr tablet Take 60 mg by mouth daily.        . metoprolol (TOPROL-XL) 50 MG 24 hr tablet Take 50 mg by mouth daily.        . pantoprazole (PROTONIX) 40 MG tablet Take 40 mg by mouth daily.        . polyethylene glycol (MIRALAX / GLYCOLAX) packet Take 17 g by mouth daily.        . Rivaroxaban (XARELTO) 15 MG TABS tablet Take 15 mg by mouth daily.        Marland Kitchen zolpidem (AMBIEN) 10 MG tablet Take 10 mg  by mouth at bedtime.         No current facility-administered medications on file as of 03/26/2011.   Allergies  Allergen Reactions  . Celebrex (Celecoxib)   . Ciprofloxacin Hcl Other (See Comments)    unknown  . Codeine Other (See Comments)    unknown  . Coumadin Other (See Comments)    Makes my skin bleed under the skin  . Doxycycline Other (See Comments)    unknown  . Famotidine Other (See Comments)    unknown  . Levofloxacin Other (See Comments)    unknown  . Macrolides And Ketolides Other (See Comments)    unknown  . Penicillins   . Plavix (Clopidogrel Bisulfate)    Family History  Problem Relation Age of Onset  . Cancer Mother     colon  . Cancer Son     colon   Social History:   reports that she has never smoked. She has never used smokeless tobacco. She reports that she does not drink alcohol or use illicit drugs.   REVIEW OF SYSTEMS - PERTINENT POSITIVES ONLY: noncontributory  Physical Exam:   Blood pressure  152/82, pulse 84, temperature 97.6 F (36.4 C), temperature source Temporal, resp. rate 20, height 5\' 2"  (1.575 m), weight 117 lb 9.6 oz (53.343 kg). Body mass index is 21.51 kg/(m^2).  Gen:  No acute distress. Elderly and talkative.   Neurological: Alert and oriented to person, place, and time. Coordination normal.  Head: Normocephalic and atraumatic.  Eyes: Conjunctivae are normal. Pupils are equal, round, and reactive to light. No scleral icterus.  Neck: Normal range of motion. Neck supple. No tracheal deviation or thyromegaly present.  Cardiovascular:  SR without murmurs or gallops Respiratory: Effort normal.  No respiratory distress. No chest wall tenderness. Breath sounds normal.  No wheezes, rales or rhonchi.  GI: Soft. Bowel sounds are normal. The abdomen is soft and nontender.  There is no rebound and no guarding. GU:  Rectal posterior fissure; palpable warty excresences up inside Musculoskeletal: Normal range of motion. Extremities are nontender.  Lymphadenopathy: No cervical, preauricular, postauricular or axillary adenopathy is present Skin: Skin is warm and dry. No rash noted. No diaphoresis. No erythema. No pallor. No clubbing, cyanosis, or edema.  Pscyh: Normal mood and affect. Behavior is normal. Judgment and thought content normal.   LABORATORY RESULTS: No results found for this or any previous visit (from the past 48 hour(s)).  RADIOLOGY RESULTS: No results found.  Problem List: Active Problems:  * No active hospital problems. *    Assessment & Plan: Examination revealed these palpable areas up around the dentate line. These did not feel or look like cancer although they could be some form of a squamous excrescence or a warty excrescence. Posteriorly she has an anal fissure I think that would account for her bleeding and pain. I will prescribe some diltiazem cream for that and we will go etc. For exam under anesthesia and biopsy. She will need to be off of her  anticoagulant for a few days. She is on that for atrial fibrillation.    Matt B. Daphine Deutscher, MD, [facility] [phone] beeper [phone]  03/26/2011 4:49 PM

## 2011-03-31 ENCOUNTER — Encounter (HOSPITAL_BASED_OUTPATIENT_CLINIC_OR_DEPARTMENT_OTHER): Payer: Self-pay | Admitting: *Deleted

## 2011-03-31 NOTE — Progress Notes (Signed)
Reviewed all notes from hosp 03/17/11 with dr frederick-ok for here Pt saw dr berry today-will get note Arrive 2 hr preop to get heparin inj per dr Daphine Deutscher

## 2011-04-02 ENCOUNTER — Encounter (HOSPITAL_BASED_OUTPATIENT_CLINIC_OR_DEPARTMENT_OTHER): Payer: Self-pay | Admitting: Certified Registered"

## 2011-04-02 ENCOUNTER — Encounter (HOSPITAL_BASED_OUTPATIENT_CLINIC_OR_DEPARTMENT_OTHER)
Admission: RE | Disposition: A | Discharge status: Home / Self Care | Payer: Self-pay | Source: Ambulatory Visit | Attending: Surgery

## 2011-04-02 ENCOUNTER — Encounter (HOSPITAL_BASED_OUTPATIENT_CLINIC_OR_DEPARTMENT_OTHER): Payer: Self-pay | Admitting: Anesthesiology

## 2011-04-02 ENCOUNTER — Ambulatory Visit (HOSPITAL_BASED_OUTPATIENT_CLINIC_OR_DEPARTMENT_OTHER): Payer: Medicare Other | Admitting: Anesthesiology

## 2011-04-02 ENCOUNTER — Other Ambulatory Visit (INDEPENDENT_AMBULATORY_CARE_PROVIDER_SITE_OTHER): Payer: Self-pay | Admitting: Surgery

## 2011-04-02 ENCOUNTER — Encounter (HOSPITAL_BASED_OUTPATIENT_CLINIC_OR_DEPARTMENT_OTHER): Payer: Self-pay | Admitting: *Deleted

## 2011-04-02 ENCOUNTER — Ambulatory Visit (HOSPITAL_BASED_OUTPATIENT_CLINIC_OR_DEPARTMENT_OTHER)
Admission: RE | Admit: 2011-04-02 | Discharge: 2011-04-02 | Disposition: A | Discharge status: Home / Self Care | Payer: Medicare Other | Source: Ambulatory Visit | Attending: Surgery | Admitting: Surgery

## 2011-04-02 DIAGNOSIS — K219 Gastro-esophageal reflux disease without esophagitis: Secondary | ICD-10-CM | POA: Insufficient documentation

## 2011-04-02 DIAGNOSIS — J4489 Other specified chronic obstructive pulmonary disease: Secondary | ICD-10-CM | POA: Insufficient documentation

## 2011-04-02 DIAGNOSIS — K6289 Other specified diseases of anus and rectum: Secondary | ICD-10-CM

## 2011-04-02 DIAGNOSIS — D128 Benign neoplasm of rectum: Secondary | ICD-10-CM

## 2011-04-02 DIAGNOSIS — I10 Essential (primary) hypertension: Secondary | ICD-10-CM | POA: Insufficient documentation

## 2011-04-02 DIAGNOSIS — I059 Rheumatic mitral valve disease, unspecified: Secondary | ICD-10-CM | POA: Insufficient documentation

## 2011-04-02 DIAGNOSIS — I2510 Atherosclerotic heart disease of native coronary artery without angina pectoris: Secondary | ICD-10-CM | POA: Insufficient documentation

## 2011-04-02 DIAGNOSIS — K449 Diaphragmatic hernia without obstruction or gangrene: Secondary | ICD-10-CM | POA: Insufficient documentation

## 2011-04-02 DIAGNOSIS — K648 Other hemorrhoids: Secondary | ICD-10-CM | POA: Insufficient documentation

## 2011-04-02 DIAGNOSIS — D129 Benign neoplasm of anus and anal canal: Secondary | ICD-10-CM

## 2011-04-02 DIAGNOSIS — J449 Chronic obstructive pulmonary disease, unspecified: Secondary | ICD-10-CM | POA: Insufficient documentation

## 2011-04-02 DIAGNOSIS — K08409 Partial loss of teeth, unspecified cause, unspecified class: Secondary | ICD-10-CM | POA: Insufficient documentation

## 2011-04-02 DIAGNOSIS — D472 Monoclonal gammopathy: Secondary | ICD-10-CM | POA: Insufficient documentation

## 2011-04-02 HISTORY — DX: Chronic obstructive pulmonary disease, unspecified: J44.9

## 2011-04-02 HISTORY — DX: Hemorrhage, not elsewhere classified: R58

## 2011-04-02 LAB — POCT I-STAT, CHEM 8
Chloride: 104 mEq/L (ref 96–112)
HCT: 46 % (ref 36.0–46.0)
Potassium: 3.9 mEq/L (ref 3.5–5.1)
Sodium: 140 mEq/L (ref 135–145)

## 2011-04-02 SURGERY — EXAM UNDER ANESTHESIA WITH HEMORRHOIDECTOMY
Anesthesia: General | Site: Rectum | Wound class: Clean Contaminated

## 2011-04-02 MED ORDER — PROPOFOL 10 MG/ML IV EMUL
INTRAVENOUS | Status: DC | PRN
  Administered 2011-04-02: 140 mg via INTRAVENOUS

## 2011-04-02 MED ORDER — HEPARIN SODIUM (PORCINE) 5000 UNIT/ML IJ SOLN
5000.0000 [IU] | Freq: Once | INTRAMUSCULAR | Status: AC
  Administered 2011-04-02: 5000 [IU] via SUBCUTANEOUS

## 2011-04-02 MED ORDER — FENTANYL CITRATE 0.05 MG/ML IJ SOLN: 25.0000 ug | INTRAMUSCULAR | Status: DC | PRN

## 2011-04-02 MED ORDER — FENTANYL CITRATE 0.05 MG/ML IJ SOLN
INTRAMUSCULAR | Status: DC | PRN
  Administered 2011-04-02: 25 ug via INTRAVENOUS

## 2011-04-02 MED ORDER — BUPIVACAINE-EPINEPHRINE 0.5% -1:200000 IJ SOLN
INTRAMUSCULAR | Status: DC | PRN
  Administered 2011-04-02: 10 mL

## 2011-04-02 MED ORDER — LACTATED RINGERS IV SOLN
INTRAVENOUS | Status: DC
  Administered 2011-04-02: 12:00:00 via INTRAVENOUS

## 2011-04-02 MED ORDER — MEPERIDINE HCL 25 MG/ML IJ SOLN: 6.2500 mg | INTRAMUSCULAR | Status: DC | PRN

## 2011-04-02 MED ORDER — PROMETHAZINE HCL 25 MG/ML IJ SOLN: 6.2500 mg | INTRAMUSCULAR | Status: DC | PRN

## 2011-04-02 MED ORDER — FLEET ENEMA 7-19 GM/118ML RE ENEM: 1.0000 | ENEMA | Freq: Once | RECTAL | Status: DC

## 2011-04-02 MED ORDER — ONDANSETRON HCL 4 MG/2ML IJ SOLN
INTRAMUSCULAR | Status: DC | PRN
  Administered 2011-04-02: 4 mg via INTRAVENOUS

## 2011-04-02 MED ORDER — HYDROCODONE-ACETAMINOPHEN 5-500 MG PO TABS: 1.0000 | ORAL_TABLET | Freq: Every day | ORAL | Status: DC

## 2011-04-02 SURGICAL SUPPLY — 36 items
BLADE SURG 15 STRL LF DISP TIS (BLADE) ×1 IMPLANT
BLADE SURG 15 STRL SS (BLADE) ×2
CANISTER SUCTION 1200CC (MISCELLANEOUS) ×2 IMPLANT
CLOTH BEACON ORANGE TIMEOUT ST (SAFETY) ×2 IMPLANT
COVER MAYO STAND STRL (DRAPES) IMPLANT
DECANTER SPIKE VIAL GLASS SM (MISCELLANEOUS) ×1 IMPLANT
DRSG PAD ABDOMINAL 8X10 ST (GAUZE/BANDAGES/DRESSINGS) IMPLANT
ELECT REM PT RETURN 9FT ADLT (ELECTROSURGICAL) ×2
ELECTRODE REM PT RTRN 9FT ADLT (ELECTROSURGICAL) ×1 IMPLANT
GAUZE SPONGE 4X4 12PLY STRL LF (GAUZE/BANDAGES/DRESSINGS) ×3 IMPLANT
GAUZE VASELINE 1X8 (GAUZE/BANDAGES/DRESSINGS) IMPLANT
GLOVE BIO SURGEON STRL SZ 6.5 (GLOVE) ×2 IMPLANT
GLOVE BIO SURGEON STRL SZ7 (GLOVE) ×1 IMPLANT
GLOVE BIO SURGEON STRL SZ8 (GLOVE) ×2 IMPLANT
GOWN PREVENTION PLUS XLARGE (GOWN DISPOSABLE) ×2 IMPLANT
GOWN PREVENTION PLUS XXLARGE (GOWN DISPOSABLE) ×2 IMPLANT
NDL HYPO 25X1 1.5 SAFETY (NEEDLE) ×1 IMPLANT
NEEDLE HYPO 25X1 1.5 SAFETY (NEEDLE) ×2 IMPLANT
PACK BASIN DAY SURGERY FS (CUSTOM PROCEDURE TRAY) ×2 IMPLANT
PACK LITHOTOMY IV (CUSTOM PROCEDURE TRAY) ×2 IMPLANT
PENCIL BUTTON HOLSTER BLD 10FT (ELECTRODE) ×2 IMPLANT
SHEET MEDIUM DRAPE 40X70 STRL (DRAPES) ×2 IMPLANT
SLEEVE SCD COMPRESS KNEE MED (MISCELLANEOUS) IMPLANT
SPONGE SURGIFOAM ABS GEL 100 (HEMOSTASIS) ×2 IMPLANT
SURGILUBE 2OZ TUBE FLIPTOP (MISCELLANEOUS) ×2 IMPLANT
SUT CHROMIC 2 0 SH (SUTURE) IMPLANT
SUT CHROMIC 3 0 SH 27 (SUTURE) ×2 IMPLANT
SYR CONTROL 10ML LL (SYRINGE) ×2 IMPLANT
TAPE CLOTH SURG 4X10 WHT LF (GAUZE/BANDAGES/DRESSINGS) ×1 IMPLANT
TOWEL OR 17X24 6PK STRL BLUE (TOWEL DISPOSABLE) ×2 IMPLANT
TRAY DSU PREP LF (CUSTOM PROCEDURE TRAY) ×2 IMPLANT
TRAY PROCTOSCOPIC FIBER OPTIC (SET/KITS/TRAYS/PACK) IMPLANT
TUBE CONNECTING 20X1/4 (TUBING) ×2 IMPLANT
UNDERPAD 30X30 INCONTINENT (UNDERPADS AND DIAPERS) ×2 IMPLANT
WATER STERILE IRR 1000ML POUR (IV SOLUTION) IMPLANT
YANKAUER SUCT BULB TIP NO VENT (SUCTIONS) ×2 IMPLANT

## 2011-04-02 NOTE — Transfer of Care (Signed)
Immediate Anesthesia Transfer of Care Note  Patient: Cathy Ortega  Procedure(s) Performed:  EXAM UNDER ANESTHESIA WITH HEMORRHOIDECTOMY - exam under anesthesia with rectal biopsy  Patient Location: PACU  Anesthesia Type: General  Level of Consciousness: awake, alert , oriented and patient cooperative  Airway & Oxygen Therapy: Patient Spontanous Breathing and Patient connected to face mask oxygen  Post-op Assessment: Report given to PACU RN and Post -op Vital signs reviewed and stable  Post vital signs: Reviewed and stable  Complications: No apparent anesthesia complications

## 2011-04-02 NOTE — H&P (Signed)
Cathy Merino, MD 03/26/2011 4:55 PM Signed  Chief Complaint: Painful bottom and small masses noted at the anorectal junction.  History of Present Illness: Cathy Ortega is an 77 y.o. female who had a colonoscopy a few weeks ago. I saw the pictures that her son brought with her and she has some small white excrescences at the dentate line. They did not biopsy these because she was anticoagulated and at that time. She's also been having quite a bit of pain she said it hurts when she sits down. She also noted quite a bit of bleeding which he had a bowel movement fillet fillet with pain.  Past Medical History   Diagnosis  Date   .  Hx of cholecystectomy  t   .  S/P hysterectomy  t   .  S/P lumbar fusion  t   .  Hypertension    .  Atrial fib/flutter, transient    .  Coronary artery disease    .  SOB (shortness of breath)  03/14/2011   .  CAD (coronary artery disease)  03/14/2011   .  CVA (cerebral vascular accident)  03/14/2011   .  PAF (paroxysmal atrial fibrillation)  03/14/2011   .  Anticoagulated  03/14/2011   .  Infarction splenic  03/14/2011   .  Bradycardia  03/14/2011   .  GERD (gastroesophageal reflux disease)    .  Hiatal hernia    .  Heart murmur    .  Arthritis    .  Thyroid disease     Past Surgical History   Procedure  Date   .  Coronary artery bypass graft    .  Bladder suspension    .  Cataract extraction w/ intraocular lens implant, bilateral    .  Tee without cardioversion  03/16/2011     Procedure: TRANSESOPHAGEAL ECHOCARDIOGRAM (TEE); Surgeon: Chrystie Nose; Location: [facility] ENDOSCOPY; Service: Cardiovascular; Laterality: N/A;   .  Colonoscopy  03/17/2011     Procedure: COLONOSCOPY; Surgeon: Barrie Folk, MD; Location: [facility] ENDOSCOPY; Service: Endoscopy; Laterality: N/A;   .  Cholecystectomy    .  Eye surgery    .  Back surgery    .  Abdominal hysterectomy  1974    Medications Prior to Admission   Medication  Sig  Dispense  Refill   .  acetaminophen (TYLENOL) 325 MG  tablet  Take 650 mg by mouth every 6 (six) hours as needed. For pain or fever     .  albuterol (PROVENTIL HFA;VENTOLIN HFA) 108 (90 BASE) MCG/ACT inhaler  Inhale 2 puffs into the lungs every 6 (six) hours as needed for wheezing or shortness of breath.  1 Inhaler  3   .  aspirin 81 MG tablet  Take 81 mg by mouth daily.     .  cholecalciferol (VITAMIN D) 1000 UNITS tablet  Take 2,000 Units by mouth daily.     Marland Kitchen  diltiazem (CARDIZEM) 120 MG tablet  Take 120 mg by mouth daily.     Marland Kitchen  ezetimibe (ZETIA) 10 MG tablet  Take 10 mg by mouth daily.     .  fluvastatin XL (LESCOL XL) 80 MG 24 hr tablet  Take 80 mg by mouth daily.     .  isosorbide mononitrate (IMDUR) 60 MG 24 hr tablet  Take 60 mg by mouth daily.     .  metoprolol (TOPROL-XL) 50 MG 24 hr tablet  Take 50 mg by mouth daily.     Marland Kitchen  pantoprazole (PROTONIX) 40 MG tablet  Take 40 mg by mouth daily.     .  polyethylene glycol (MIRALAX / GLYCOLAX) packet  Take 17 g by mouth daily.     .  Rivaroxaban (XARELTO) 15 MG TABS tablet  Take 15 mg by mouth daily.     Marland Kitchen  zolpidem (AMBIEN) 10 MG tablet  Take 10 mg by mouth at bedtime.      No current facility-administered medications on file as of 03/26/2011.    Allergies   Allergen  Reactions   .  Celebrex (Celecoxib)    .  Ciprofloxacin Hcl  Other (See Comments)     unknown   .  Codeine  Other (See Comments)     unknown   .  Coumadin  Other (See Comments)     Makes my skin bleed under the skin   .  Doxycycline  Other (See Comments)     unknown   .  Famotidine  Other (See Comments)     unknown   .  Levofloxacin  Other (See Comments)     unknown   .  Macrolides And Ketolides  Other (See Comments)     unknown   .  Penicillins    .  Plavix (Clopidogrel Bisulfate)     Family History   Problem  Relation  Age of Onset   .  Cancer  Mother       colon    .  Cancer  Son       colon    Social History: reports that she has never smoked. She has never used smokeless tobacco. She reports that she  does not drink alcohol or use illicit drugs.  REVIEW OF SYSTEMS - PERTINENT POSITIVES ONLY:  noncontributory  Physical Exam:  Blood pressure 152/82, pulse 84, temperature 97.6 F (36.4 C), temperature source Temporal, resp. rate 20, height 5\' 2"  (1.575 m), weight 117 lb 9.6 oz (53.343 kg).  Body mass index is 21.51 kg/(m^2).  Gen: No acute distress. Elderly and talkative.  Neurological: Alert and oriented to person, place, and time. Coordination normal.  Head: Normocephalic and atraumatic.  Eyes: Conjunctivae are normal. Pupils are equal, round, and reactive to light. No scleral icterus.  Neck: Normal range of motion. Neck supple. No tracheal deviation or thyromegaly present.  Cardiovascular: SR without murmurs or gallops  Respiratory: Effort normal. No respiratory distress. No chest wall tenderness. Breath sounds normal. No wheezes, rales or rhonchi.  GI: Soft. Bowel sounds are normal. The abdomen is soft and nontender. There is no rebound and no guarding.  GU: Rectal posterior fissure; palpable warty excresences up inside Musculoskeletal: Normal range of motion. Extremities are nontender.  Lymphadenopathy: No cervical, preauricular, postauricular or axillary adenopathy is present Skin: Skin is warm and dry. No rash noted. No diaphoresis. No erythema. No pallor. No clubbing, cyanosis, or edema.  Pscyh: Normal mood and affect. Behavior is normal. Judgment and thought content normal.  LABORATORY RESULTS:  No results found for this or any previous visit (from the past 48 hour(s)).  RADIOLOGY RESULTS:  No results found.  Problem List:  Active Problems:  * No active hospital problems. *   Assessment & Plan:  Examination revealed these palpable areas up around the dentate line. These did not feel or look like cancer although they could be some form of a squamous excrescence or a warty excrescence. Posteriorly she has an anal fissure I think that would account for her bleeding and pain. I will  prescribe some diltiazem cream for that and we will go etc. For exam under anesthesia and biopsy. She will need to be off of her anticoagulant for a few days. She is on that for atrial fibrillation.  Matt B. Daphine Deutscher, MD, [facility]  [phone] beeper  [phone]  03/26/2011 4:49 PM    There has been no change in the patient's past medical history or physical exam in the past 24 hours to the best of my knowledge.  Expectations and outcome results have been discussed with the patient to include risks and benefits.  All questions have been answered and will proceed with previously discussed procedure noted and signed in the consent form in the patient's record.    Kache Mcclurg BMD @NOW  04/02/2011

## 2011-04-02 NOTE — Anesthesia Preprocedure Evaluation (Addendum)
Anesthesia Evaluation   Patient awake    Reviewed: Allergy & Precautions, H&P , NPO status , Patient's Chart, lab work & pertinent test results, reviewed documented beta blocker date and time   Airway Mallampati: I TM Distance: >3 FB Neck ROM: Full    Dental  (+) Edentulous Upper and Partial Lower   Pulmonary shortness of breath, COPD clear to auscultation        Cardiovascular hypertension, Pt. on medications and Pt. on home beta blockers + CAD + dysrhythmias Atrial Fibrillation + Valvular Problems/Murmurs MR Irregular Normal    Neuro/Psych    GI/Hepatic Neg liver ROS, hiatal hernia, GERD-  Medicated and Controlled,  Endo/Other  Negative Endocrine ROS  Renal/GU negative Renal ROS     Musculoskeletal negative musculoskeletal ROS (+)   Abdominal   Peds  Hematology negative hematology ROS (+)   Anesthesia Other Findings   Reproductive/Obstetrics negative OB ROS                           Anesthesia Physical Anesthesia Plan  ASA: III  Anesthesia Plan: General LMA   Post-op Pain Management:    Induction: Intravenous  Airway Management Planned: Oral ETT  Additional Equipment:   Intra-op Plan:   Post-operative Plan: Extubation in OR  Informed Consent: I have reviewed the patients History and Physical, chart, labs and discussed the procedure including the risks, benefits and alternatives for the proposed anesthesia with the patient or authorized representative who has indicated his/her understanding and acceptance.     Plan Discussed with: CRNA  Anesthesia Plan Comments:        Anesthesia Quick Evaluation

## 2011-04-02 NOTE — Op Note (Signed)
Surgeon: Wenda Low, MD, FACS  Asst:  none  Anes:  gen by LMA  Procedure: EUA, excision of anterior dentate line excrescence  Diagnosis: Growth on dentate line seen at recent colonoscopy, hemorrhoids-internal  Complications: none  EBL:   4 cc  Description of Procedure:  The patient was taken to OR 8 on Friday, April 02, 2011 and was given general and placed in dorsal lithotomy.  PCMX prep was used and a timeout performed.  EUA was performed with anal retractor.  Anterior there was an excrescence that was friable.  I clamped across the base and excised it and oversewed the base with 3-0 chromic sutures.  An adjacent area was similarly clamped and excised and oversewn.  A foam sponge was inserted into the rectum and the perianal region was injected with 10 cc of 0.25% marcaine with epi.    Matt B. Daphine Deutscher, MD, [facility], [place] [phone]

## 2011-04-02 NOTE — Anesthesia Postprocedure Evaluation (Signed)
  Anesthesia Post-op Note  Patient: Cathy Ortega  Procedure(s) Performed:  EXAM UNDER ANESTHESIA WITH HEMORRHOIDECTOMY - exam under anesthesia with rectal biopsy  Patient Location: PACU  Anesthesia Type: General  Level of Consciousness: awake  Airway and Oxygen Therapy: Patient Spontanous Breathing and Patient connected to face mask oxygen  Post-op Pain: none  Post-op Assessment: Post-op Vital signs reviewed, Patient's Cardiovascular Status Stable and Respiratory Function Stable  Post-op Vital Signs: Reviewed and stable  Complications: No apparent anesthesia complications

## 2011-04-02 NOTE — Anesthesia Procedure Notes (Signed)
Procedure Name: LMA Insertion Date/Time: 04/02/2011 2:17 PM Performed by: Radford Pax Pre-anesthesia Checklist: Patient identified, Emergency Drugs available, Suction available, Patient being monitored and Timeout performed Patient Re-evaluated:Patient Re-evaluated prior to inductionOxygen Delivery Method: Circle System Utilized Preoxygenation: Pre-oxygenation with 100% oxygen Intubation Type: IV induction Ventilation: Mask ventilation without difficulty LMA: LMA inserted LMA Size: 3.0 Number of attempts: 1 (atraumatic) Placement Confirmation: positive ETCO2 and breath sounds checked- equal and bilateral Tube secured with: Tape (plastic tape used) Dental Injury: Teeth and Oropharynx as per pre-operative assessment  Comments: Patient removed upper denture, will follow to PACU

## 2011-04-07 ENCOUNTER — Telehealth (INDEPENDENT_AMBULATORY_CARE_PROVIDER_SITE_OTHER): Payer: Self-pay | Admitting: Surgery

## 2011-04-30 ENCOUNTER — Encounter (INDEPENDENT_AMBULATORY_CARE_PROVIDER_SITE_OTHER): Payer: Self-pay | Admitting: Surgery

## 2011-04-30 ENCOUNTER — Ambulatory Visit (INDEPENDENT_AMBULATORY_CARE_PROVIDER_SITE_OTHER): Payer: Medicare Other | Admitting: Surgery

## 2011-04-30 VITALS — BP 142/90 | HR 88 | Temp 97.4°F | Resp 14 | Ht 62.0 in | Wt 117.2 lb

## 2011-04-30 DIAGNOSIS — K6289 Other specified diseases of anus and rectum: Secondary | ICD-10-CM

## 2011-04-30 DIAGNOSIS — R198 Other specified symptoms and signs involving the digestive system and abdomen: Secondary | ICD-10-CM

## 2011-04-30 NOTE — Progress Notes (Signed)
Cathy Ortega  Had EUA and biopsy of these areas at her dentate line.  This proved to be chronic, benign changes related to prolapse or solitary ulcer syndrome.  I have reassured her and her family.  She should avoid sitting and straining at stool. I'll be glad to see whenever needed. Return p.r.n.

## 2011-04-30 NOTE — Patient Instructions (Signed)
Avoid sitting and straining at stool

## 2011-05-05 ENCOUNTER — Encounter (INDEPENDENT_AMBULATORY_CARE_PROVIDER_SITE_OTHER): Payer: Medicare Other | Admitting: Surgery

## 2011-07-14 ENCOUNTER — Emergency Department (HOSPITAL_COMMUNITY): Payer: Medicare Other

## 2011-07-14 ENCOUNTER — Emergency Department (HOSPITAL_COMMUNITY)
Admission: EM | Admit: 2011-07-14 | Discharge: 2011-07-14 | Disposition: A | Discharge status: Home / Self Care | Payer: Medicare Other | Attending: Emergency Medicine | Admitting: Emergency Medicine

## 2011-07-14 ENCOUNTER — Encounter (HOSPITAL_COMMUNITY): Payer: Self-pay | Admitting: Emergency Medicine

## 2011-07-14 DIAGNOSIS — J069 Acute upper respiratory infection, unspecified: Secondary | ICD-10-CM

## 2011-07-14 DIAGNOSIS — Z79899 Other long term (current) drug therapy: Secondary | ICD-10-CM | POA: Insufficient documentation

## 2011-07-14 DIAGNOSIS — I2510 Atherosclerotic heart disease of native coronary artery without angina pectoris: Secondary | ICD-10-CM | POA: Insufficient documentation

## 2011-07-14 DIAGNOSIS — I10 Essential (primary) hypertension: Secondary | ICD-10-CM | POA: Insufficient documentation

## 2011-07-14 DIAGNOSIS — K219 Gastro-esophageal reflux disease without esophagitis: Secondary | ICD-10-CM | POA: Insufficient documentation

## 2011-07-14 DIAGNOSIS — R0602 Shortness of breath: Secondary | ICD-10-CM | POA: Insufficient documentation

## 2011-07-14 DIAGNOSIS — R059 Cough, unspecified: Secondary | ICD-10-CM | POA: Insufficient documentation

## 2011-07-14 DIAGNOSIS — R05 Cough: Secondary | ICD-10-CM | POA: Insufficient documentation

## 2011-07-14 LAB — TROPONIN I: Troponin I: <0.3 ng/mL (ref <0.30)

## 2011-07-14 LAB — CBC
MCV: 89.3 fL (ref 78.0–100.0)
Platelets: 197 10*3/uL (ref 150–400)
RBC: 4.41 MIL/uL (ref 3.87–5.11)
WBC: 5.5 10*3/uL (ref 4.0–10.5)

## 2011-07-14 LAB — BASIC METABOLIC PANEL
CO2: 26 mEq/L (ref 19–32)
Chloride: 105 mEq/L (ref 96–112)
GFR calc Af Amer: >90 mL/min (ref >90)
Potassium: 4 mEq/L (ref 3.5–5.1)
Sodium: 139 mEq/L (ref 135–145)

## 2011-07-14 MED ORDER — ALBUTEROL SULFATE HFA 108 (90 BASE) MCG/ACT IN AERS
2.0000 | INHALATION_SPRAY | RESPIRATORY_TRACT | Status: DC | PRN
  Administered 2011-07-14: 2 via RESPIRATORY_TRACT
  Filled 2011-07-14: qty 6.7

## 2011-07-14 MED ORDER — ALBUTEROL SULFATE HFA 108 (90 BASE) MCG/ACT IN AERS: 1.0000 | INHALATION_SPRAY | Freq: Four times a day (QID) | RESPIRATORY_TRACT | Status: DC | PRN

## 2011-07-14 MED ORDER — SULFAMETHOXAZOLE-TRIMETHOPRIM 800-160 MG PO TABS
1.0000 | ORAL_TABLET | Freq: Two times a day (BID) | ORAL | Status: AC
Start: 2011-07-14 — End: 2011-07-24

## 2011-07-14 NOTE — Discharge Instructions (Signed)
Cool Mist Vaporizers Vaporizers may help relieve the symptoms of a cough and cold. By adding water to the air, mucus may become thinner and less sticky. This makes it easier to breathe and cough up secretions. Vaporizers have not been proven to show they help with colds. You should not use a vaporizer if you are allergic to mold. Cool mist vaporizers do not cause serious burns like hot mist vaporizers ("steamers"). HOME CARE INSTRUCTIONS  Follow the package instructions for your vaporizer.   Use a vaporizer that holds a large volume of water (1 to 2 gallons [5.7 to 7.5 liters]).   Do not use anything other than distilled water in the vaporizer.   Do not run the vaporizer all of the time. This can cause mold or bacteria to grow in the vaporizer.   Clean the vaporizer after each time you use it.   Clean and dry the vaporizer well before you store it.   Stop using a vaporizer if you develop worsening respiratory symptoms.  Document Released: 12/25/2003 Document Revised: 03/18/2011 Document Reviewed: 11/21/2008 Exit[employer] Patient Information 2012 [employer].Upper Respiratory Infection, Adult An upper respiratory infection (URI) is also sometimes known as the common cold. The upper respiratory tract includes the nose, sinuses, throat, trachea, and bronchi. Bronchi are the airways leading to the lungs. Most people improve within 1 week, but symptoms can last up to 2 weeks. A residual cough may last even longer.  CAUSES Many different viruses can infect the tissues lining the upper respiratory tract. The tissues become irritated and inflamed and often become very moist. Mucus production is also common. A cold is contagious. You can easily spread the virus to others by oral contact. This includes kissing, sharing a glass, coughing, or sneezing. Touching your mouth or nose and then touching a surface, which is then touched by another person, can also spread the virus. SYMPTOMS  Symptoms typically  develop 1 to 3 days after you come in contact with a cold virus. Symptoms vary from person to person. They may include:  Runny nose.   Sneezing.   Nasal congestion.   Sinus irritation.   Sore throat.   Loss of voice (laryngitis).   Cough.   Fatigue.   Muscle aches.   Loss of appetite.   Headache.   Low-grade fever.  DIAGNOSIS  You might diagnose your own cold based on familiar symptoms, since most people get a cold 2 to 3 times a year. Your caregiver can confirm this based on your exam. Most importantly, your caregiver can check that your symptoms are not due to another disease such as strep throat, sinusitis, pneumonia, asthma, or epiglottitis. Blood tests, throat tests, and X-rays are not necessary to diagnose a common cold, but they may sometimes be helpful in excluding other more serious diseases. Your caregiver will decide if any further tests are required. RISKS AND COMPLICATIONS  You may be at risk for a more severe case of the common cold if you smoke cigarettes, have chronic heart disease (such as heart failure) or lung disease (such as asthma), or if you have a weakened immune system. The very young and very old are also at risk for more serious infections. Bacterial sinusitis, middle ear infections, and bacterial pneumonia can complicate the common cold. The common cold can worsen asthma and chronic obstructive pulmonary disease (COPD). Sometimes, these complications can require emergency medical care and may be life-threatening. PREVENTION  The best way to protect against getting a cold is to practice   good hygiene. Avoid oral or hand contact with people with cold symptoms. Wash your hands often if contact occurs. There is no clear evidence that vitamin C, vitamin E, echinacea, or exercise reduces the chance of developing a cold. However, it is always recommended to get plenty of rest and practice good nutrition. TREATMENT  Treatment is directed at relieving symptoms. There  is no cure. Antibiotics are not effective, because the infection is caused by a virus, not by bacteria. Treatment may include:  Increased fluid intake. Sports drinks offer valuable electrolytes, sugars, and fluids.   Breathing heated mist or steam (vaporizer or shower).   Eating chicken soup or other clear broths, and maintaining good nutrition.   Getting plenty of rest.   Using gargles or lozenges for comfort.   Controlling fevers with ibuprofen or acetaminophen as directed by your caregiver.   Increasing usage of your inhaler if you have asthma.  Zinc gel and zinc lozenges, taken in the first 24 hours of the common cold, can shorten the duration and lessen the severity of symptoms. Pain medicines may help with fever, muscle aches, and throat pain. A variety of non-prescription medicines are available to treat congestion and runny nose. Your caregiver can make recommendations and may suggest nasal or lung inhalers for other symptoms.  HOME CARE INSTRUCTIONS   Only take over-the-counter or prescription medicines for pain, discomfort, or fever as directed by your caregiver.   Use a warm mist humidifier or inhale steam from a shower to increase air moisture. This may keep secretions moist and make it easier to breathe.   Drink enough water and fluids to keep your urine clear or pale yellow.   Rest as needed.   Return to work when your temperature has returned to normal or as your caregiver advises. You may need to stay home longer to avoid infecting others. You can also use a face mask and careful hand washing to prevent spread of the virus.  SEEK MEDICAL CARE IF:   After the first few days, you feel you are getting worse rather than better.   You need your caregiver's advice about medicines to control symptoms.   You develop chills, worsening shortness of breath, or brown or red sputum. These may be signs of pneumonia.   You develop yellow or brown nasal discharge or pain in the  face, especially when you bend forward. These may be signs of sinusitis.   You develop a fever, swollen neck glands, pain with swallowing, or white areas in the back of your throat. These may be signs of strep throat.  SEEK IMMEDIATE MEDICAL CARE IF:   You have a fever.   You develop severe or persistent headache, ear pain, sinus pain, or chest pain.   You develop wheezing, a prolonged cough, cough up blood, or have a change in your usual mucus (if you have chronic lung disease).   You develop sore muscles or a stiff neck.  Document Released: 09/22/2000 Document Revised: 03/18/2011 Document Reviewed: 07/31/2010 Exit[employer] Patient Information 2012 [employer]. 

## 2011-07-14 NOTE — ED Notes (Signed)
PT reports tightness in chest. Actively coughing in triage. Reports feels like something sitting on chest.

## 2011-07-14 NOTE — ED Provider Notes (Signed)
Medical screening examination/treatment/procedure(s) were conducted as a shared visit with non-physician practitioner(s) and myself.  I personally evaluated the patient during the encounter.  Complains of URI symptoms. No respiratory distress. Will restart antibiotic. Rx for albuterol  Donnetta Hutching, MD 07/14/11 2356

## 2011-07-14 NOTE — ED Provider Notes (Signed)
History     CSN: [id number]  Arrival date & time 07/14/11  7829   First MD Initiated Contact with Patient 07/14/11 [phone]      Chief Complaint  Patient presents with  . Shortness of Breath    (Consider location/radiation/quality/duration/timing/severity/associated sxs/prior treatment) HPI  Pt presents to the ED with complaints of shortness of breath and cough. Pt has had URI symptoms for two weeks, seen by Dr. Gery Pray, treated with abx and prednisone. She states that on Sunday her SOB started to get worse and since then it has been progressively getting worse until today where she felt as though she needed to come to the ED. She states she has a.fib for which she takes Diltiazem for. She denies chest pains, weakness, fatigue,   Past Medical History  Diagnosis Date  . Hx of cholecystectomy t  . S/P hysterectomy t  . S/P lumbar fusion t  . Hypertension   . Atrial fib/flutter, transient   . Coronary artery disease   . SOB (shortness of breath) 03/14/2011  . CAD (coronary artery disease) 03/14/2011  . PAF (paroxysmal atrial fibrillation) 03/14/2011  . Anticoagulated 03/14/2011  . Infarction splenic 03/14/2011  . Bradycardia 03/14/2011  . GERD (gastroesophageal reflux disease)   . Hiatal hernia   . Heart murmur   . Arthritis   . Thyroid disease   . CVA (cerebral vascular accident) 03/14/2011  . Bleeding 12/12    rectal  . COPD (chronic obstructive pulmonary disease)     on xray    Past Surgical History  Procedure Date  . Coronary artery bypass graft   . Bladder suspension   . Cataract extraction w/ intraocular lens  implant, bilateral   . Tee without cardioversion 03/16/2011    Procedure: TRANSESOPHAGEAL ECHOCARDIOGRAM (TEE);  Surgeon: Chrystie Nose;  Location: [facility] ENDOSCOPY;  Service: Cardiovascular;  Laterality: N/A;  . Colonoscopy 03/17/2011    Procedure: COLONOSCOPY;  Surgeon: Barrie Folk, MD;  Location: [facility] ENDOSCOPY;  Service: Endoscopy;  Laterality: N/A;  . Cholecystectomy    . Back surgery   . Abdominal hysterectomy 1974  . Eye surgery     both cataracts    Family History  Problem Relation Age of Onset  . Cancer Mother     colon  . Cancer Son     colon    History  Substance Use Topics  . Smoking status: Never Smoker   . Smokeless tobacco: Never Used  . Alcohol Use: No    OB History    Grav Para Term Preterm Abortions TAB SAB Ect Mult Living                  Review of Systems  Allergies  Celebrex; Ciprofloxacin hcl; Codeine; Coumadin; Doxycycline; Famotidine; Levofloxacin; Macrolides and ketolides; Penicillins; and Plavix  Home Medications   Current Outpatient Rx  Name Route Sig Dispense Refill  . ACETAMINOPHEN 325 MG PO TABS Oral Take 650 mg by mouth every 6 (six) hours as needed. For pain or fever     . ASPIRIN 81 MG PO TABS Oral Take 81 mg by mouth daily.      Marland Kitchen VITAMIN D 1000 UNITS PO TABS Oral Take 2,000 Units by mouth daily.      Marland Kitchen EZETIMIBE 10 MG PO TABS Oral Take 10 mg by mouth daily.      Marland Kitchen FLUVASTATIN SODIUM ER 80 MG PO TB24 Oral Take 80 mg by mouth daily.      . ISOSORBIDE MONONITRATE ER  60 MG PO TB24 Oral Take 60 mg by mouth daily.      . MELOXICAM 7.5 MG PO TABS Oral Take 7.5 mg by mouth 2 (two) times daily as needed. For pain    . METOPROLOL SUCCINATE ER 50 MG PO TB24 Oral Take 50 mg by mouth daily.      . ADULT MULTIVITAMIN W/MINERALS CH Oral Take 1 tablet by mouth daily.    Marland Kitchen NITROGLYCERIN 0.4 MG SL SUBL Sublingual Place 0.4 mg under the tongue every 5 (five) minutes as needed. For chest pain    . PANTOPRAZOLE SODIUM 40 MG PO TBEC Oral Take 40 mg by mouth daily.      Marland Kitchen POLYETHYLENE GLYCOL 3350 PO PACK Oral Take 17 g by mouth daily.      Marland Kitchen RIVAROXABAN 15 MG PO TABS Oral Take 15 mg by mouth daily.     Marland Kitchen ZOLPIDEM TARTRATE 10 MG PO TABS Oral Take 10 mg by mouth at bedtime.      . ALBUTEROL SULFATE HFA 108 (90 BASE) MCG/ACT IN AERS Inhalation Inhale 1-2 puffs into the lungs every 6 (six) hours as needed for wheezing. 1  Inhaler 0  . SULFAMETHOXAZOLE-TRIMETHOPRIM 800-160 MG PO TABS Oral Take 1 tablet by mouth every 12 (twelve) hours. 20 tablet 0    BP 157/62  Pulse 80  Temp(Src) 97.8 F (36.6 C) (Oral)  Resp 17  Ht 5\' 2"  (1.575 m)  Wt 110 lb (49.896 kg)  BMI 20.12 kg/m2  SpO2 98%  Physical Exam  Nursing note and vitals reviewed. Constitutional: She appears well-developed and well-nourished. No distress.  HENT:  Head: Normocephalic and atraumatic.  Eyes: Pupils are equal, round, and reactive to light.  Neck: Normal range of motion. Neck supple.  Cardiovascular: Normal rate and regular rhythm.   Pulmonary/Chest: Effort normal and breath sounds normal. No respiratory distress. She has no wheezes. She has no rales. She exhibits no tenderness.  Abdominal: Soft. There is no tenderness.  Neurological: She is alert.  Skin: Skin is warm and dry.    ED Course  Procedures (including critical care time)  Labs Reviewed  BASIC METABOLIC PANEL - Abnormal; Notable for the following:    GFR calc non Af Amer 80 (*)    All other components within normal limits  CBC  TROPONIN I   Dg Chest 2 View  07/14/2011  *RADIOLOGY REPORT*  Clinical Data: Shortness of breath and cough  CHEST - 2 VIEW  Comparison: 03/14/2011  Findings: Stable appearance of mediastinal postoperative changes. Normal heart size and pulmonary vascularity.  Emphysematous changes in the lungs.  Slight fibrosis in the lung bases.  No focal airspace consolidation.  No blunting of costophrenic angles.  No pneumothorax.  No acute changes since previous study.  IMPRESSION: Emphysematous changes in the lungs with scattered fibrosis.  No evidence of active pulmonary disease.  Original Report Authenticated By: Marlon Pel, M.D.     1. URI (upper respiratory infection)       MDM     Date: 07/14/2011  Rate: 85  Rhythm: atrial fibrillation and premature ventricular contractions (PVC)  QRS Axis: right  Intervals: normal  ST/T Wave  abnormalities: normal  Conduction Disutrbances:none  Narrative Interpretation: Right Ventriclar hypertrophy  Old EKG Reviewed: unchanged from Mar 16, 2011   Pt finished round of unknown antibiotics. Not in EPIC and family does not remember the name. Will give patient another round of Abx, I would prefer Levaquin but she is allergic to Cipro,  doxycycline, Levofloxacin, macrolides and penicillin.   I have consulted with the pharmacist who recommends Bactrim 1 q 12 hours for 10 days. Stay really hydrated, if you stop urinating stop medications and go be seen again.   Pt given albuterol inhaler in ED and a prescription for one as well.   I have discussed the patient with my supervising attending who is aware of my work-up and plan. Pt has been advised of the symptoms that warrant their return to the ED. Patient has voiced understanding and has agreed to follow-up with the PCP or specialist.      Dorthula Matas, PA 07/14/11 [phone]

## 2011-07-14 NOTE — ED Notes (Signed)
PT having respiratory issues from cold that has been ongoing for 2 weeks. PT took antibiotic and prednisone initially. Significant cardiac hx. Afib. Blood clot in heart and spleen.

## 2011-07-14 NOTE — ED Notes (Signed)
Patient transported to X-ray 

## 2011-07-14 NOTE — ED Notes (Signed)
PA at bedside.

## 2011-08-18 ENCOUNTER — Other Ambulatory Visit (HOSPITAL_COMMUNITY): Payer: Self-pay | Admitting: Physician Assistant

## 2011-08-18 ENCOUNTER — Ambulatory Visit (HOSPITAL_COMMUNITY)
Admission: RE | Admit: 2011-08-18 | Discharge: 2011-08-18 | Disposition: A | Discharge status: Home / Self Care | Payer: Medicare Other | Source: Ambulatory Visit | Attending: Physician Assistant | Admitting: Physician Assistant

## 2011-08-18 DIAGNOSIS — M25559 Pain in unspecified hip: Secondary | ICD-10-CM

## 2011-10-12 ENCOUNTER — Other Ambulatory Visit (HOSPITAL_COMMUNITY): Payer: Self-pay | Admitting: Family Medicine

## 2011-10-12 DIAGNOSIS — Z139 Encounter for screening, unspecified: Secondary | ICD-10-CM

## 2011-11-04 ENCOUNTER — Ambulatory Visit (HOSPITAL_COMMUNITY)
Admission: RE | Admit: 2011-11-04 | Discharge: 2011-11-04 | Disposition: A | Discharge status: Home / Self Care | Payer: Medicare Other | Source: Ambulatory Visit | Attending: Family Medicine | Admitting: Family Medicine

## 2011-11-04 DIAGNOSIS — Z1231 Encounter for screening mammogram for malignant neoplasm of breast: Secondary | ICD-10-CM | POA: Insufficient documentation

## 2011-11-04 DIAGNOSIS — Z139 Encounter for screening, unspecified: Secondary | ICD-10-CM

## 2011-12-17 ENCOUNTER — Emergency Department (HOSPITAL_COMMUNITY): Payer: Medicare Other

## 2011-12-17 ENCOUNTER — Inpatient Hospital Stay (HOSPITAL_COMMUNITY)
Admission: EM | Admit: 2011-12-17 | Discharge: 2011-12-21 | DRG: 287 | Disposition: A | Discharge status: Home / Self Care | Payer: Medicare Other | Attending: Cardiology | Admitting: Cardiology

## 2011-12-17 ENCOUNTER — Encounter (HOSPITAL_COMMUNITY): Payer: Self-pay | Admitting: Family Medicine

## 2011-12-17 DIAGNOSIS — I10 Essential (primary) hypertension: Secondary | ICD-10-CM | POA: Diagnosis present

## 2011-12-17 DIAGNOSIS — Z951 Presence of aortocoronary bypass graft: Secondary | ICD-10-CM

## 2011-12-17 DIAGNOSIS — I2510 Atherosclerotic heart disease of native coronary artery without angina pectoris: Principal | ICD-10-CM | POA: Diagnosis present

## 2011-12-17 DIAGNOSIS — Z7901 Long term (current) use of anticoagulants: Secondary | ICD-10-CM

## 2011-12-17 DIAGNOSIS — R079 Chest pain, unspecified: Secondary | ICD-10-CM | POA: Diagnosis present

## 2011-12-17 DIAGNOSIS — J4489 Other specified chronic obstructive pulmonary disease: Secondary | ICD-10-CM | POA: Diagnosis present

## 2011-12-17 DIAGNOSIS — Z7902 Long term (current) use of antithrombotics/antiplatelets: Secondary | ICD-10-CM

## 2011-12-17 DIAGNOSIS — J449 Chronic obstructive pulmonary disease, unspecified: Secondary | ICD-10-CM | POA: Diagnosis present

## 2011-12-17 DIAGNOSIS — D735 Infarction of spleen: Secondary | ICD-10-CM | POA: Diagnosis present

## 2011-12-17 DIAGNOSIS — R0602 Shortness of breath: Secondary | ICD-10-CM | POA: Diagnosis present

## 2011-12-17 DIAGNOSIS — Z8673 Personal history of transient ischemic attack (TIA), and cerebral infarction without residual deficits: Secondary | ICD-10-CM

## 2011-12-17 DIAGNOSIS — E785 Hyperlipidemia, unspecified: Secondary | ICD-10-CM | POA: Diagnosis present

## 2011-12-17 DIAGNOSIS — I482 Chronic atrial fibrillation, unspecified: Secondary | ICD-10-CM | POA: Diagnosis present

## 2011-12-17 DIAGNOSIS — Z7982 Long term (current) use of aspirin: Secondary | ICD-10-CM

## 2011-12-17 DIAGNOSIS — I4891 Unspecified atrial fibrillation: Secondary | ICD-10-CM | POA: Diagnosis present

## 2011-12-17 DIAGNOSIS — I639 Cerebral infarction, unspecified: Secondary | ICD-10-CM | POA: Diagnosis present

## 2011-12-17 DIAGNOSIS — R001 Bradycardia, unspecified: Secondary | ICD-10-CM | POA: Diagnosis present

## 2011-12-17 LAB — HEPATIC FUNCTION PANEL
ALT: 15 U/L (ref 0–35)
Alkaline Phosphatase: 80 U/L (ref 39–117)
Bilirubin, Direct: 0.1 mg/dL (ref 0.0–0.3)
Indirect Bilirubin: 0.5 mg/dL (ref 0.3–0.9)

## 2011-12-17 LAB — CBC WITH DIFFERENTIAL/PLATELET
Basophils Absolute: 0 10*3/uL (ref 0.0–0.1)
Basophils Relative: 1 % (ref 0–1)
Eosinophils Absolute: 0.2 10*3/uL (ref 0.0–0.7)
Eosinophils Relative: 2 % (ref 0–5)
HCT: 39.3 % (ref 36.0–46.0)
Hemoglobin: 13.3 g/dL (ref 12.0–15.0)
Lymphocytes Relative: 17 % (ref 12–46)
Lymphs Abs: 1.1 10*3/uL (ref 0.7–4.0)
MCH: 30.2 pg (ref 26.0–34.0)
MCHC: 33.8 g/dL (ref 30.0–36.0)
MCV: 89.3 fL (ref 78.0–100.0)
Monocytes Absolute: 0.5 10*3/uL (ref 0.1–1.0)
Monocytes Relative: 8 % (ref 3–12)
Neutro Abs: 4.7 10*3/uL (ref 1.7–7.7)
Neutrophils Relative %: 72 % (ref 43–77)
Platelets: 256 10*3/uL (ref 150–400)
RBC: 4.4 MIL/uL (ref 3.87–5.11)
RDW: 12 % (ref 11.5–15.5)
WBC: 6.5 10*3/uL (ref 4.0–10.5)

## 2011-12-17 LAB — POCT I-STAT, CHEM 8
Calcium, Ion: 1.2 mmol/L (ref 1.13–1.30)
Chloride: 106 mEq/L (ref 96–112)
Glucose, Bld: 90 mg/dL (ref 70–99)
HCT: 40 % (ref 36.0–46.0)
TCO2: 25 mmol/L (ref 0–100)

## 2011-12-17 LAB — CK TOTAL AND CKMB (NOT AT ARMC): CK, MB: 2.9 ng/mL (ref 0.3–4.0)

## 2011-12-17 LAB — PROTIME-INR: INR: 1.37 (ref 0.00–1.49)

## 2011-12-17 LAB — MRSA PCR SCREENING: MRSA by PCR: NEGATIVE

## 2011-12-17 MED ORDER — POLYETHYLENE GLYCOL 3350 17 G PO PACK
17.0000 g | PACK | Freq: Every day | ORAL | Status: DC
  Administered 2011-12-18: 17 g via ORAL
  Filled 2011-12-17 (×5): qty 1

## 2011-12-17 MED ORDER — ACETAMINOPHEN 325 MG PO TABS: 650.0000 mg | ORAL_TABLET | Freq: Four times a day (QID) | ORAL | Status: DC | PRN

## 2011-12-17 MED ORDER — PANTOPRAZOLE SODIUM 40 MG PO TBEC
40.0000 mg | DELAYED_RELEASE_TABLET | Freq: Every day | ORAL | Status: DC
  Administered 2011-12-17 – 2011-12-21 (×5): 40 mg via ORAL
  Filled 2011-12-17 (×6): qty 1

## 2011-12-17 MED ORDER — ONDANSETRON HCL 4 MG/2ML IJ SOLN: 4.0000 mg | Freq: Four times a day (QID) | INTRAMUSCULAR | Status: DC | PRN

## 2011-12-17 MED ORDER — ASPIRIN 81 MG PO CHEW
324.0000 mg | CHEWABLE_TABLET | ORAL | Status: AC
  Administered 2011-12-17: 324 mg via ORAL
  Filled 2011-12-17: qty 4

## 2011-12-17 MED ORDER — ZOLPIDEM TARTRATE 5 MG PO TABS
5.0000 mg | ORAL_TABLET | Freq: Every day | ORAL | Status: DC
  Administered 2011-12-17 – 2011-12-20 (×4): 5 mg via ORAL
  Filled 2011-12-17 (×4): qty 1

## 2011-12-17 MED ORDER — ASPIRIN 81 MG PO TABS: 81.0000 mg | ORAL_TABLET | Freq: Every day | ORAL | Status: DC

## 2011-12-17 MED ORDER — ASPIRIN EC 81 MG PO TBEC
81.0000 mg | DELAYED_RELEASE_TABLET | Freq: Every day | ORAL | Status: DC
  Administered 2011-12-18 – 2011-12-20 (×3): 81 mg via ORAL
  Filled 2011-12-17 (×3): qty 1

## 2011-12-17 MED ORDER — NITROGLYCERIN 0.4 MG SL SUBL: 0.4000 mg | SUBLINGUAL_TABLET | SUBLINGUAL | Status: DC | PRN

## 2011-12-17 MED ORDER — SODIUM CHLORIDE 0.9 % IV SOLN: INTRAVENOUS | Status: DC

## 2011-12-17 MED ORDER — ACETAMINOPHEN 325 MG PO TABS
650.0000 mg | ORAL_TABLET | ORAL | Status: DC | PRN
  Administered 2011-12-18 – 2011-12-20 (×5): 650 mg via ORAL
  Filled 2011-12-17 (×5): qty 2

## 2011-12-17 MED ORDER — ALPRAZOLAM 0.25 MG PO TABS
0.2500 mg | ORAL_TABLET | Freq: Two times a day (BID) | ORAL | Status: DC | PRN
  Filled 2011-12-17: qty 1

## 2011-12-17 MED ORDER — NITROGLYCERIN IN D5W 200-5 MCG/ML-% IV SOLN
5.0000 ug/min | INTRAVENOUS | Status: DC
  Administered 2011-12-17: 20 ug/min via INTRAVENOUS
  Administered 2011-12-17 – 2011-12-18 (×2): 5 ug/min via INTRAVENOUS
  Filled 2011-12-17: qty 250

## 2011-12-17 MED ORDER — METOPROLOL SUCCINATE ER 50 MG PO TB24
50.0000 mg | ORAL_TABLET | Freq: Every day | ORAL | Status: DC
  Administered 2011-12-17 – 2011-12-21 (×5): 50 mg via ORAL
  Filled 2011-12-17 (×5): qty 1

## 2011-12-17 MED ORDER — ASPIRIN 300 MG RE SUPP
300.0000 mg | RECTAL | Status: AC
  Filled 2011-12-17: qty 1

## 2011-12-17 MED ORDER — RIVAROXABAN 15 MG PO TABS
15.0000 mg | ORAL_TABLET | Freq: Every day | ORAL | Status: DC
  Administered 2011-12-17: 15 mg via ORAL
  Filled 2011-12-17 (×3): qty 1

## 2011-12-17 MED ORDER — SIMVASTATIN 10 MG PO TABS
10.0000 mg | ORAL_TABLET | Freq: Every day | ORAL | Status: DC
  Administered 2011-12-17 – 2011-12-20 (×4): 10 mg via ORAL
  Filled 2011-12-17 (×5): qty 1

## 2011-12-17 MED ORDER — EZETIMIBE 10 MG PO TABS
10.0000 mg | ORAL_TABLET | Freq: Every day | ORAL | Status: DC
  Administered 2011-12-17 – 2011-12-21 (×5): 10 mg via ORAL
  Filled 2011-12-17 (×5): qty 1

## 2011-12-17 NOTE — ED Provider Notes (Signed)
History     CSN: [id number]  Arrival date & time 12/17/11  1191   First MD Initiated Contact with Patient 12/17/11 [phone]      Chief Complaint  Patient presents with  . Chest Pain    (Consider location/radiation/quality/duration/timing/severity/associated sxs/prior treatment) The history is provided by the patient.  patient presents with chest pain or shortness of breath. The chest pain was typical of her angina. She states she gets that and want. It started last night his continued this morning. This is unusual for her. She's also had some increasing shortness of breath. No fevers. No cough. No nausea vomiting diarrhea. She does not have nitroglycerin tablets at home. She states she's had some trouble breathing like this since her CABG. She's not on oxygen at home.  Past Medical History  Diagnosis Date  . Hx of cholecystectomy t  . S/P hysterectomy t  . S/P lumbar fusion t  . Hypertension   . Atrial fib/flutter, transient   . Coronary artery disease   . SOB (shortness of breath) 03/14/2011  . CAD (coronary artery disease) 03/14/2011  . PAF (paroxysmal atrial fibrillation) 03/14/2011  . Anticoagulated 03/14/2011  . Infarction splenic 03/14/2011  . Bradycardia 03/14/2011  . GERD (gastroesophageal reflux disease)   . Hiatal hernia   . Heart murmur   . Arthritis   . Thyroid disease   . CVA (cerebral vascular accident) 03/14/2011  . Bleeding 12/12    rectal  . COPD (chronic obstructive pulmonary disease)     on xray    Past Surgical History  Procedure Date  . Coronary artery bypass graft   . Bladder suspension   . Cataract extraction w/ intraocular lens  implant, bilateral   . Tee without cardioversion 03/16/2011    Procedure: TRANSESOPHAGEAL ECHOCARDIOGRAM (TEE);  Surgeon: Chrystie Nose;  Location: [facility] ENDOSCOPY;  Service: Cardiovascular;  Laterality: N/A;  . Colonoscopy 03/17/2011    Procedure: COLONOSCOPY;  Surgeon: Barrie Folk, MD;  Location: [facility] ENDOSCOPY;  Service:  Endoscopy;  Laterality: N/A;  . Cholecystectomy   . Back surgery   . Abdominal hysterectomy 1974  . Eye surgery     both cataracts    Family History  Problem Relation Age of Onset  . Cancer Mother     colon  . Cancer Son     colon    History  Substance Use Topics  . Smoking status: Never Smoker   . Smokeless tobacco: Never Used  . Alcohol Use: No    OB History    Grav Para Term Preterm Abortions TAB SAB Ect Mult Living                  Review of Systems  Constitutional: Negative for chills, appetite change and fatigue.  HENT: Negative for neck pain.   Respiratory: Positive for shortness of breath. Negative for choking and chest tightness.   Cardiovascular: Positive for chest pain.  Gastrointestinal: Negative for abdominal pain.  Genitourinary: Negative for dysuria.  Musculoskeletal: Negative for back pain.  Neurological: Negative for headaches.  Psychiatric/Behavioral: Negative for agitation.    Allergies  Celebrex; Ciprofloxacin hcl; Codeine; Doxycycline; Famotidine; Levofloxacin; Macrolides and ketolides; Penicillins; Plavix; and Warfarin sodium  Home Medications   Current Outpatient Rx  Name Route Sig Dispense Refill  . ACETAMINOPHEN 325 MG PO TABS Oral Take 650 mg by mouth every 6 (six) hours as needed. For pain or fever     . ASPIRIN 81 MG PO TABS Oral Take 81 mg by  mouth daily.      Marland Kitchen VITAMIN D 1000 UNITS PO TABS Oral Take 2,000 Units by mouth daily.      Marland Kitchen EZETIMIBE 10 MG PO TABS Oral Take 10 mg by mouth daily.      Marland Kitchen FLUVASTATIN SODIUM ER 80 MG PO TB24 Oral Take 80 mg by mouth daily.      . ISOSORBIDE MONONITRATE ER 60 MG PO TB24 Oral Take 120 mg by mouth daily.     . MELOXICAM 7.5 MG PO TABS Oral Take 7.5 mg by mouth 2 (two) times daily as needed. For pain    . METOPROLOL SUCCINATE ER 50 MG PO TB24 Oral Take 50 mg by mouth daily.      . ADULT MULTIVITAMIN W/MINERALS CH Oral Take 1 tablet by mouth daily.    Marland Kitchen PANTOPRAZOLE SODIUM 40 MG PO TBEC Oral Take  40 mg by mouth daily.      Marland Kitchen POLYETHYLENE GLYCOL 3350 PO PACK Oral Take 17 g by mouth daily.      Marland Kitchen RIVAROXABAN 15 MG PO TABS Oral Take 15 mg by mouth daily.     Marland Kitchen ZOLPIDEM TARTRATE 10 MG PO TABS Oral Take 10 mg by mouth at bedtime.        BP 135/63  Pulse 79  Temp 98.7 F (37.1 C) (Oral)  Resp 23  SpO2 100%  Physical Exam  Nursing note and vitals reviewed. Constitutional: She is oriented to person, place, and time. She appears well-developed and well-nourished.  HENT:  Head: Normocephalic and atraumatic.  Eyes: EOM are normal. Pupils are equal, round, and reactive to light.  Neck: Normal range of motion. Neck supple.  Cardiovascular: Normal rate, regular rhythm and normal heart sounds.   No murmur heard. Pulmonary/Chest: Effort normal and breath sounds normal. No respiratory distress. She has no wheezes. She has no rales.  Abdominal: Soft. Bowel sounds are normal. She exhibits no distension. There is no tenderness. There is no rebound and no guarding.  Musculoskeletal: Normal range of motion.  Neurological: She is alert and oriented to person, place, and time. No cranial nerve deficit.  Skin: Skin is warm and dry.  Psychiatric: She has a normal mood and affect. Her speech is normal.    ED Course  Procedures (including critical care time)  Labs Reviewed  PROTIME-INR - Abnormal; Notable for the following:    Prothrombin Time 17.1 (*)     All other components within normal limits  CBC WITH DIFFERENTIAL  TROPONIN I  POCT I-STAT, CHEM 8   Dg Chest 2 View  12/17/2011  *RADIOLOGY REPORT*  Clinical Data: Shortness of breath.  Chest pressure.  CHEST - 2 VIEW  Comparison: 07/14/2011  Findings: The lungs are clear without focal infiltrate, edema, pneumothorax or pleural effusion. Hyperexpansion is consistent with emphysema. The cardiopericardial silhouette is enlarged.  The patient is status post CABG Bones are diffusely demineralized.  IMPRESSION: Stable.  Cardiomegaly with emphysema.   No acute cardiopulmonary process.   Original Report Authenticated By: ERIC A. MANSELL, M.D.      1. Chest pain      Date: 12/17/2011  Rate: 93  Rhythm: atrial flutter  QRS Axis: normal  Intervals: aflutter  ST/T Wave abnormalities: normal  Conduction Disutrbances:none  Narrative Interpretation:   Old EKG Reviewed: unchanged    MDM  Patient with midsternal chest pain. Increasing shortness of breath. States it feels like her previous heart problems. She states it began last night and continued this morning. She  states is unusual for her. EKG is reassuring. Cardiac enzymes are negative. She'll be admitted to cardiology.        Juliet Rude. Rubin Payor, MD 12/17/11 [phone]

## 2011-12-17 NOTE — ED Notes (Signed)
Per pt midsternal chest pain associated with SOB since yesterday evening.

## 2011-12-17 NOTE — H&P (Addendum)
Cathy Ortega is an 78 y.o. female.   Chief Complaint: midsternal chest pressure and SOB HPI: 78 year old WF well known to Dr. Allyson Sabal with history of CAD status post bypass graft in 1998. She was catheterized in 2000 and again in 2005, as well as 2010, revealing patent grafts with an atretic LIMA. Other problems include hypertension, hyperlipidemia, and paroxysmal atrial fibrillation, now chronic.  Myoview performed 2012 was nonischemic. Coumadin at one point was stopped due to her erratic INRs and began her on aspirin and Plavix. Unfortunately, she did have a stroke on February 28, 2010,  through March 04, 2010, with some left upper extremity motor weakness. She underwent cerebral angiography revealing some distal right common carotid stenosis. She was discharged home and changed from Plavix to now xarelto.   Last 2-3 months patient has had increasing shortness of breath at rest and with exertion.  He is aware of this and was planning on cardiovascular test.  Yesterday the patient Began having Chest pressure midsternal with radiation to her back associated with shortness of breath. She went to bed slept until 3 AM and again awoke with chest pressure with radiation to her back.  The pain was initially described 10/10 now it is down to 5/10.  No associated nausea or vomiting and no diaphoresis.  Last cath 07/2010 without change from 2010, with VG to PDA patent, VG to OM patent, VG to diag. Patent with retrograde filling down the LAD and atreptic LIMA.   Last nuc study was before the cardiac  Cath and was low risk scan but she continued with chest pain. Last TEE 03/2011 with continued lt. Atrial appendage thrombus.  She continues in chronic atrial fibrillation on Xarelto. 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  Past Medical History  Diagnosis Date  . Hx of cholecystectomy t  . S/P hysterectomy t  . S/P lumbar fusion t  . Hypertension   . Atrial fib/flutter, transient   .  Coronary artery disease   . SOB (shortness of breath) 03/14/2011  . CAD (coronary artery disease) 03/14/2011  . PAF (paroxysmal atrial fibrillation) 03/14/2011  . Anticoagulated 03/14/2011  . Infarction splenic 03/14/2011  . Bradycardia 03/14/2011  . GERD (gastroesophageal reflux disease)   . Hiatal hernia   . Heart murmur   . Arthritis   . Thyroid disease   . CVA (cerebral vascular accident) 03/14/2011  . Bleeding 12/12    rectal  . COPD (chronic obstructive pulmonary disease)     on xray    Past Surgical History  Procedure Date  . Coronary artery bypass graft   . Bladder suspension   . Cataract extraction w/ intraocular lens  implant, bilateral   . Tee without cardioversion 03/16/2011    Procedure: TRANSESOPHAGEAL ECHOCARDIOGRAM (TEE);  Surgeon: Chrystie Nose;  Location: [facility] ENDOSCOPY;  Service: Cardiovascular;  Laterality: N/A;  . Colonoscopy 03/17/2011    Procedure: COLONOSCOPY;  Surgeon: Barrie Folk, MD;  Location: [facility] ENDOSCOPY;  Service: Endoscopy;  Laterality: N/A;  . Cholecystectomy   . Back surgery   . Abdominal hysterectomy 1974  . Eye surgery     both cataracts    Family History  Problem Relation Age of Onset  . Cancer Mother     colon  . Cancer Son     colon   Social History:  reports that she has never smoked. She has never used smokeless tobacco. She reports that she does not drink alcohol or use  illicit drugs. Her daughter lives with her and other family or close by.  Allergies:  Allergies  Allergen Reactions  . Celebrex (Celecoxib)   . Ciprofloxacin Hcl Other (See Comments)    unknown  . Codeine Other (See Comments)    unknown  . Doxycycline Other (See Comments)    unknown  . Famotidine Other (See Comments)    unknown  . Levofloxacin Other (See Comments)    unknown  . Macrolides And Ketolides Other (See Comments)    unknown  . Penicillins   . Plavix (Clopidogrel Bisulfate)   . Warfarin Sodium Other (See Comments)    Makes my skin bleed under  the skin   OUT PATIENT Medications: Twice a day half tablet of fluid pill Tylenol 650 mg every 2-4 hours Aspirin 81 mg daily Xarelto 15 mg daily at supper time Lescol 80 mg daily Diltiazem 180 mg daily Imdur 60 mg 2 tablets daily Metoprolol XL 50 mg daily Protonix 40 mg daily Zetia 10 mg nightly Zolpidem 10 mg nightly Nitroglycerin sublingual. MiraLAX 17 g in liquid as needed Multivitamin daily Vitamin D3 daily Meloxicam 7.5 mg one or 2 daily only if severe arthritic pain  Results for orders placed during the hospital encounter of 12/17/11 (from the past 48 hour(s))  CBC WITH DIFFERENTIAL     Status: Normal   Collection Time   12/17/11  9:16 AM      Component Value Range Comment   WBC 6.5  4.0 - 10.5 K/uL    RBC 4.40  3.87 - 5.11 MIL/uL    Hemoglobin 13.3  12.0 - 15.0 g/dL    HCT 16.1  09.6 - 04.5 %    MCV 89.3  78.0 - 100.0 fL    MCH 30.2  26.0 - 34.0 pg    MCHC 33.8  30.0 - 36.0 g/dL    RDW 40.9  81.1 - 91.4 %    Platelets 256  150 - 400 K/uL    Neutrophils Relative 72  43 - 77 %    Neutro Abs 4.7  1.7 - 7.7 K/uL    Lymphocytes Relative 17  12 - 46 %    Lymphs Abs 1.1  0.7 - 4.0 K/uL    Monocytes Relative 8  3 - 12 %    Monocytes Absolute 0.5  0.1 - 1.0 K/uL    Eosinophils Relative 2  0 - 5 %    Eosinophils Absolute 0.2  0.0 - 0.7 K/uL    Basophils Relative 1  0 - 1 %    Basophils Absolute 0.0  0.0 - 0.1 K/uL   PROTIME-INR     Status: Abnormal   Collection Time   12/17/11  9:16 AM      Component Value Range Comment   Prothrombin Time 17.1 (*) 11.6 - 15.2 seconds    INR 1.37  0.00 - 1.49   TROPONIN I     Status: Normal   Collection Time   12/17/11  9:16 AM      Component Value Range Comment   Troponin I <0.30  <0.30 ng/mL   POCT I-STAT, CHEM 8     Status: Normal   Collection Time   12/17/11  9:37 AM      Component Value Range Comment   Sodium 141  135 - 145 mEq/L    Potassium 3.5  3.5 - 5.1 mEq/L    Chloride 106  96 - 112 mEq/L    BUN 7  6 - 23  mg/dL     Creatinine, Ser 4.09  0.50 - 1.10 mg/dL    Glucose, Bld 90  70 - 99 mg/dL    Calcium, Ion 8.11  9.14 - 1.30 mmol/L    TCO2 25  0 - 100 mmol/L    Hemoglobin 13.6  12.0 - 15.0 g/dL    HCT 78.2  95.6 - 21.3 %   HEPATIC FUNCTION PANEL     Status: Normal   Collection Time   12/17/11  2:20 PM      Component Value Range Comment   Total Protein 7.0  6.0 - 8.3 g/dL    Albumin 3.9  3.5 - 5.2 g/dL    AST 22  0 - 37 U/L    ALT 15  0 - 35 U/L    Alkaline Phosphatase 80  39 - 117 U/L    Total Bilirubin 0.6  0.3 - 1.2 mg/dL    Bilirubin, Direct 0.1  0.0 - 0.3 mg/dL    Indirect Bilirubin 0.5  0.3 - 0.9 mg/dL   CK TOTAL AND CKMB     Status: Normal   Collection Time   12/17/11  2:20 PM      Component Value Range Comment   Total CK 69  7 - 177 U/L    CK, MB 2.9  0.3 - 4.0 ng/mL    Relative Index RELATIVE INDEX IS INVALID  0.0 - 2.5   TROPONIN I     Status: Normal   Collection Time   12/17/11  2:20 PM      Component Value Range Comment   Troponin I <0.30  <0.30 ng/mL   MRSA PCR SCREENING     Status: Normal   Collection Time   12/17/11  5:31 PM      Component Value Range Comment   MRSA by PCR NEGATIVE  NEGATIVE    Dg Chest 2 View  12/17/2011  *RADIOLOGY REPORT*  Clinical Data: Shortness of breath.  Chest pressure.  CHEST - 2 VIEW  Comparison: 07/14/2011  Findings: The lungs are clear without focal infiltrate, edema, pneumothorax or pleural effusion. Hyperexpansion is consistent with emphysema. The cardiopericardial silhouette is enlarged.  The patient is status post CABG Bones are diffusely demineralized.  IMPRESSION: Stable.  Cardiomegaly with emphysema.  No acute cardiopulmonary process.   Original Report Authenticated By: ERIC A. MANSELL, M.D.     ROS: General: No colds or fevers, she has lost weight without particularly trying Skin:No rashes no ulcers HEENT:No blurred vision, no double vision YQ:MVHQI pain as described ONG:EXBMWUXLK of breath as described GI:No diarrhea constipation, She has had  at least 2 episodes of bright blood from her rectum, but stops bleeding if she puts a cold compress to her rectum  GU:No hematuria or dysuria GM:WNUUVOZD for arthritic pain it can be severe at times Neuro:No syncope no lightheadedness no dizziness Endo:No diabetes or thyroid disease   Blood pressure 147/76, pulse 81, temperature 98.1 F (36.7 C), temperature source Oral, resp. rate 20, height 5\' 2"  (1.575 m), weight 54 kg (119 lb 0.8 oz), SpO2 98.00%. PE: General:Alert and oriented white female no acute distress though ongoing chest pressure Skin:Warm and dry, brisk capillary refill HEENT:Normocephalic, sclera clear, glasses in place Neck:Supple, no JVD no carotid bruits Heart:S1-S2 Irreg Irreg rhythm without obvious murmur gallop rub or click Lungs:Clear without rales rhonchi wheezes GUY:QIHKVQQV bowel sounds, soft nontender Ext:No edema 1+ pedal pulse bilaterally Neuro:Alert and oriented x3, follows commands, moves all extremities.    Assessment/Plan Principal  Problem:  *Chest pain at rest Active Problems:  SOB (shortness of breath)  Chronic atrial fibrillation - AA thrombus  CAD (coronary artery disease)  CVA (cerebral vascular accident)  Anticoagulated  Plan:Will admit to step down with ongoing chest pain, IV nitroglycerin. We may continue Xareltol today and if enzymes are negative proceed with nuclear stress test tomorrow. If cardiac enzymes are positive would stop Xarelto and proceed with cardiac catheterization on Monday. We'll we'll do serial cardiac enzymes.  INGOLD,LAURA R 12/17/2011, 2:33 PM  ATTENDING ATTESTATION:  I have seen and examined the patient along with Nada Boozer, NP.  I have reviewed the chart, notes and new data.  I agree with Laura's findings, examination & recommendations as noted above.  Brief Description: 78 y/o woman with long-standing h/o CAD-CABG (SVGs patent with atretic LIMA), CAF (with AA thrombus) - complicated by splenic infarct -- on Xarelto,  HTN, HLD p/w Left sided chest pressure & DOE -- worse over last 3 months & came on @ rest this AM @ 3AM.    Exam is relatively benign besides labile BPs.  Key new findings / data: Troponin negative x 2  PLAN:  At this point - with her AM CP awakening her & constant, I am not convinced that this is ACS (although she certainly has the CAD history for ACS).  I am in favor of Myoview in the AM as a non-invasive approach since she is on Xarelto for Afib/AA thrombus, but if she has more CP over night while on NTG gtt -- would opt for simply planning LHC on Monday, holding Xarelto.  Her BP is quite labile -- will use IV NTG for control along with BB. May need to consider ACE-I  ASA - but unable to load Plavix prophylactically, will need to use Brilinta if cath with PCI indicated.  Zetia & statin for HLD.  Her Primary Cardiologist is Dr. Allyson Sabal who is aware of her worsening DOE - but the chest pressure is new.  Marykay Lex, M.D., M.S. [facility] [address] [place], [place]  [address]  [phone]  12/17/2011 7:20 PM

## 2011-12-17 NOTE — ED Notes (Signed)
Report called to RN.  Pt. Transported to room.

## 2011-12-18 LAB — BASIC METABOLIC PANEL
BUN: 9 mg/dL (ref 6–23)
Chloride: 101 mEq/L (ref 96–112)
GFR calc Af Amer: >90 mL/min (ref >90)
GFR calc non Af Amer: 80 mL/min — ABNORMAL LOW (ref >90)
Potassium: 3.7 mEq/L (ref 3.5–5.1)
Sodium: 139 mEq/L (ref 135–145)

## 2011-12-18 LAB — CBC
HCT: 38.5 % (ref 36.0–46.0)
Hemoglobin: 13.2 g/dL (ref 12.0–15.0)
MCHC: 34.3 g/dL (ref 30.0–36.0)
RDW: 11.9 % (ref 11.5–15.5)
WBC: 7.2 10*3/uL (ref 4.0–10.5)

## 2011-12-18 LAB — MAGNESIUM: Magnesium: 1.9 mg/dL (ref 1.5–2.5)

## 2011-12-18 LAB — GLUCOSE, CAPILLARY: Glucose-Capillary: 134 mg/dL — ABNORMAL HIGH (ref 70–99)

## 2011-12-18 LAB — TSH: TSH: 2.794 u[IU]/mL (ref 0.350–4.500)

## 2011-12-18 LAB — PROTIME-INR: INR: 2.49 — ABNORMAL HIGH (ref 0.00–1.49)

## 2011-12-18 LAB — TROPONIN I: Troponin I: <0.3 ng/mL

## 2011-12-18 MED ORDER — HEPARIN (PORCINE) IN NACL 100-0.45 UNIT/ML-% IJ SOLN
600.0000 [IU]/h | INTRAMUSCULAR | Status: DC
  Administered 2011-12-18: 600 [IU]/h via INTRAVENOUS
  Filled 2011-12-18 (×2): qty 250

## 2011-12-18 NOTE — Progress Notes (Signed)
Pt. Complaining of SOB.  All V/S stable.  Placed on O2 @ 2 liters for comfort.  Pt. States feels "much better".  Will continue to monitor.

## 2011-12-18 NOTE — Progress Notes (Addendum)
ANTICOAGULATION CONSULT NOTE - Initial Consult  Pharmacy Consult for Heparin Indication: chest pain/ACS  Allergies  Allergen Reactions  . Celebrex (Celecoxib)   . Ciprofloxacin Hcl Other (See Comments)    unknown  . Codeine Other (See Comments)    unknown  . Doxycycline Other (See Comments)    unknown  . Famotidine Other (See Comments)    unknown  . Levofloxacin Other (See Comments)    unknown  . Macrolides And Ketolides Other (See Comments)    unknown  . Penicillins   . Plavix (Clopidogrel Bisulfate)   . Warfarin Sodium Other (See Comments)    Makes my skin bleed under the skin    Patient Measurements: Height: 5\' 2"  (157.5 cm) Weight: 119 lb 0.8 oz (54 kg) IBW/kg (Calculated) : 50.1  Heparin Dosing Weight: 54 kg   Vital Signs: Temp: 98.4 F (36.9 C) (09/07 0430) Temp src: Oral (09/07 0430) BP: 118/61 mmHg (09/07 0430) Pulse Rate: 71  (09/07 0430)  Labs:  Basename 12/18/11 0205 12/17/11 2046 12/17/11 1420 12/17/11 0937 12/17/11 0916  HGB 13.2 -- -- 13.6 --  HCT 38.5 -- -- 40.0 39.3  PLT 258 -- -- -- 256  APTT -- -- -- -- --  LABPROT 27.3* -- -- -- 17.1*  INR 2.49* -- -- -- 1.37  HEPARINUNFRC -- -- -- -- --  CREATININE 0.59 -- -- 0.70 --  CKTOTAL -- -- 69 -- --  CKMB -- -- 2.9 -- --  TROPONINI <0.30 <0.30 <0.30 -- --    Estimated Creatinine Clearance: 39.2 ml/min (by C-G formula based on Cr of 0.59).   Medical History: Past Medical History  Diagnosis Date  . Hx of cholecystectomy t  . S/P hysterectomy t  . S/P lumbar fusion t  . Hypertension   . Atrial fib/flutter, transient   . Coronary artery disease   . SOB (shortness of breath) 03/14/2011  . CAD (coronary artery disease) 03/14/2011  . PAF (paroxysmal atrial fibrillation) 03/14/2011  . Anticoagulated 03/14/2011  . Infarction splenic 03/14/2011  . Bradycardia 03/14/2011  . GERD (gastroesophageal reflux disease)   . Hiatal hernia   . Heart murmur   . Arthritis   . Thyroid disease   . CVA  (cerebral vascular accident) 03/14/2011  . Bleeding 12/12    rectal  . COPD (chronic obstructive pulmonary disease)     on xray   Assessment: 89yof with Hx CAD/CABG presented with CP. Now CP free but with SOB.   She was taking rivaroxaban PTA for A-Fib.  Plan is to stop rivaroxaban, transition to IV heparin and cath on Monday.  CBC stable, INR elvated but rivaroxaban gives an inconcistant increase in INR, renal fx stable.  Will initiate IV heparin 24hr after last rivaroxaban dose without a bolus.   Goal of Therapy:  Heparin level 0.3-0.7 units/ml Monitor platelets by anticoagulation protocol: Yes   Plan:  At 2000 tonight begin heparin drip 600 uts/hr  Draw daily cbc, heparin level  Marcelino Scot 12/18/2011,8:32 AM

## 2011-12-18 NOTE — Progress Notes (Signed)
Subjective:  No CP but continued SOB.  Objective:  Temp:  [97.7 F (36.5 C)-98.7 F (37.1 C)] 98.4 F (36.9 C) (09/07 0430) Pulse Rate:  [70-95] 71  (09/07 0430) Resp:  [14-24] 18  (09/07 0430) BP: (84-204)/(54-101) 118/61 mmHg (09/07 0430) SpO2:  [95 %-100 %] 97 % (09/07 0430) Weight:  [54 kg (119 lb 0.8 oz)] 54 kg (119 lb 0.8 oz) (09/06 1700) Weight change:   Intake/Output from previous day: 09/06 0701 - 09/07 0700 In: 302.5 [P.O.:240; I.V.:62.5] Out: 1450 [Urine:1450]  Intake/Output from this shift:    Physical Exam: General appearance: alert, cooperative, appears stated age and no distress Neck: no adenopathy, no carotid bruit, no JVD, supple, symmetrical, trachea midline and thyroid not enlarged, symmetric, no tenderness/mass/nodules Lungs: clear to auscultation bilaterally Heart: irregularly irregular rhythm Extremities: extremities normal, atraumatic, no cyanosis or edema  Lab Results: Results for orders placed during the hospital encounter of 12/17/11 (from the past 48 hour(s))  CBC WITH DIFFERENTIAL     Status: Normal   Collection Time   12/17/11  9:16 AM      Component Value Range Comment   WBC 6.5  4.0 - 10.5 K/uL    RBC 4.40  3.87 - 5.11 MIL/uL    Hemoglobin 13.3  12.0 - 15.0 g/dL    HCT 66.4  40.3 - 47.4 %    MCV 89.3  78.0 - 100.0 fL    MCH 30.2  26.0 - 34.0 pg    MCHC 33.8  30.0 - 36.0 g/dL    RDW 25.9  56.3 - 87.5 %    Platelets 256  150 - 400 K/uL    Neutrophils Relative 72  43 - 77 %    Neutro Abs 4.7  1.7 - 7.7 K/uL    Lymphocytes Relative 17  12 - 46 %    Lymphs Abs 1.1  0.7 - 4.0 K/uL    Monocytes Relative 8  3 - 12 %    Monocytes Absolute 0.5  0.1 - 1.0 K/uL    Eosinophils Relative 2  0 - 5 %    Eosinophils Absolute 0.2  0.0 - 0.7 K/uL    Basophils Relative 1  0 - 1 %    Basophils Absolute 0.0  0.0 - 0.1 K/uL   PROTIME-INR     Status: Abnormal   Collection Time   12/17/11  9:16 AM      Component Value Range Comment   Prothrombin Time 17.1  (*) 11.6 - 15.2 seconds    INR 1.37  0.00 - 1.49   TROPONIN I     Status: Normal   Collection Time   12/17/11  9:16 AM      Component Value Range Comment   Troponin I <0.30  <0.30 ng/mL   POCT I-STAT, CHEM 8     Status: Normal   Collection Time   12/17/11  9:37 AM      Component Value Range Comment   Sodium 141  135 - 145 mEq/L    Potassium 3.5  3.5 - 5.1 mEq/L    Chloride 106  96 - 112 mEq/L    BUN 7  6 - 23 mg/dL    Creatinine, Ser 6.43  0.50 - 1.10 mg/dL    Glucose, Bld 90  70 - 99 mg/dL    Calcium, Ion 3.29  5.18 - 1.30 mmol/L    TCO2 25  0 - 100 mmol/L    Hemoglobin 13.6  12.0 - 15.0  g/dL    HCT 16.1  09.6 - 04.5 %   HEPATIC FUNCTION PANEL     Status: Normal   Collection Time   12/17/11  2:20 PM      Component Value Range Comment   Total Protein 7.0  6.0 - 8.3 g/dL    Albumin 3.9  3.5 - 5.2 g/dL    AST 22  0 - 37 U/L    ALT 15  0 - 35 U/L    Alkaline Phosphatase 80  39 - 117 U/L    Total Bilirubin 0.6  0.3 - 1.2 mg/dL    Bilirubin, Direct 0.1  0.0 - 0.3 mg/dL    Indirect Bilirubin 0.5  0.3 - 0.9 mg/dL   TSH     Status: Normal   Collection Time   12/17/11  2:20 PM      Component Value Range Comment   TSH 2.794  0.350 - 4.500 uIU/mL   CK TOTAL AND CKMB     Status: Normal   Collection Time   12/17/11  2:20 PM      Component Value Range Comment   Total CK 69  7 - 177 U/L    CK, MB 2.9  0.3 - 4.0 ng/mL    Relative Index RELATIVE INDEX IS INVALID  0.0 - 2.5   TROPONIN I     Status: Normal   Collection Time   12/17/11  2:20 PM      Component Value Range Comment   Troponin I <0.30  <0.30 ng/mL   MRSA PCR SCREENING     Status: Normal   Collection Time   12/17/11  5:31 PM      Component Value Range Comment   MRSA by PCR NEGATIVE  NEGATIVE   TROPONIN I     Status: Normal   Collection Time   12/17/11  8:46 PM      Component Value Range Comment   Troponin I <0.30  <0.30 ng/mL   TROPONIN I     Status: Normal   Collection Time   12/18/11  2:05 AM      Component Value Range Comment     Troponin I <0.30  <0.30 ng/mL   MAGNESIUM     Status: Normal   Collection Time   12/18/11  2:05 AM      Component Value Range Comment   Magnesium 1.9  1.5 - 2.5 mg/dL   CBC     Status: Normal   Collection Time   12/18/11  2:05 AM      Component Value Range Comment   WBC 7.2  4.0 - 10.5 K/uL    RBC 4.33  3.87 - 5.11 MIL/uL    Hemoglobin 13.2  12.0 - 15.0 g/dL    HCT 40.9  81.1 - 91.4 %    MCV 88.9  78.0 - 100.0 fL    MCH 30.5  26.0 - 34.0 pg    MCHC 34.3  30.0 - 36.0 g/dL    RDW 78.2  95.6 - 21.3 %    Platelets 258  150 - 400 K/uL   BASIC METABOLIC PANEL     Status: Abnormal   Collection Time   12/18/11  2:05 AM      Component Value Range Comment   Sodium 139  135 - 145 mEq/L    Potassium 3.7  3.5 - 5.1 mEq/L    Chloride 101  96 - 112 mEq/L    CO2 27  19 - 32  mEq/L    Glucose, Bld 96  70 - 99 mg/dL    BUN 9  6 - 23 mg/dL    Creatinine, Ser 6.21  0.50 - 1.10 mg/dL    Calcium 9.9  8.4 - 30.8 mg/dL    GFR calc non Af Amer 80 (*) >90 mL/min    GFR calc Af Amer >90  >90 mL/min   PROTIME-INR     Status: Abnormal   Collection Time   12/18/11  2:05 AM      Component Value Range Comment   Prothrombin Time 27.3 (*) 11.6 - 15.2 seconds    INR 2.49 (*) 0.00 - 1.49     Imaging: Imaging results have been reviewed  Assessment/Plan:   1. Principal Problem: 2.  *Chest pain at rest 3. Active Problems: 4.  SOB (shortness of breath) 5.  CAD (coronary artery disease) 6.  CVA (cerebral vascular accident) 7.  Chronic atrial fibrillation - AA thrombus 8.  Anticoagulated 9.   Time Spent Directly with Patient:  20 minutes  Length of Stay:  LOS: 1 day   Pt with known CAD s/p remote CAGB, progressive SOB and new SSCP prompting admission. Neg myoview last year and cath 18 months ago unremarkable. Exam benign. Enz neg. EKG w/o acute changes. I'm leaning towards definitive diagnostic cath on Monday and will cancel myoview. Start IV hep and D/C Xaralto.   [name] 12/18/2011, 8:09  AM

## 2011-12-19 LAB — APTT: aPTT: 50 seconds — ABNORMAL HIGH (ref 24–37)

## 2011-12-19 LAB — CBC
Hemoglobin: 13.7 g/dL (ref 12.0–15.0)
MCH: 29.9 pg (ref 26.0–34.0)
MCHC: 33.5 g/dL (ref 30.0–36.0)
Platelets: 257 10*3/uL (ref 150–400)
RDW: 12 % (ref 11.5–15.5)

## 2011-12-19 LAB — HEPARIN LEVEL (UNFRACTIONATED)
Heparin Unfractionated: 0.52 IU/mL (ref 0.30–0.70)
Heparin Unfractionated: 0.11 IU/mL — ABNORMAL LOW (ref 0.30–0.70)

## 2011-12-19 LAB — GLUCOSE, CAPILLARY: Glucose-Capillary: 98 mg/dL (ref 70–99)

## 2011-12-19 MED ORDER — SODIUM CHLORIDE 0.9 % IJ SOLN: 3.0000 mL | INTRAMUSCULAR | Status: DC | PRN

## 2011-12-19 MED ORDER — SODIUM CHLORIDE 0.9 % IJ SOLN
3.0000 mL | Freq: Two times a day (BID) | INTRAMUSCULAR | Status: DC
  Administered 2011-12-19 – 2011-12-20 (×3): 3 mL via INTRAVENOUS

## 2011-12-19 MED ORDER — SODIUM CHLORIDE 0.9 % IV SOLN: 1.0000 mL/kg/h | INTRAVENOUS | Status: DC

## 2011-12-19 MED ORDER — HEPARIN (PORCINE) IN NACL 100-0.45 UNIT/ML-% IJ SOLN
750.0000 [IU]/h | INTRAMUSCULAR | Status: DC
  Administered 2011-12-20: 750 [IU]/h via INTRAVENOUS
  Filled 2011-12-19: qty 250

## 2011-12-19 MED ORDER — ASPIRIN 81 MG PO CHEW
324.0000 mg | CHEWABLE_TABLET | ORAL | Status: AC
  Administered 2011-12-20: 324 mg via ORAL
  Filled 2011-12-19: qty 4

## 2011-12-19 MED ORDER — HEPARIN BOLUS VIA INFUSION
2000.0000 [IU] | Freq: Once | INTRAVENOUS | Status: AC
  Administered 2011-12-19: 2000 [IU] via INTRAVENOUS
  Filled 2011-12-19: qty 2000

## 2011-12-19 MED ORDER — SODIUM CHLORIDE 0.9 % IV SOLN: 250.0000 mL | INTRAVENOUS | Status: DC | PRN

## 2011-12-19 MED ORDER — DIAZEPAM 5 MG PO TABS
5.0000 mg | ORAL_TABLET | ORAL | Status: DC
  Administered 2011-12-20: 5 mg via ORAL

## 2011-12-19 NOTE — Progress Notes (Signed)
Subjective:  No CP/SOB  Objective:  Temp:  [97.1 F (36.2 C)-97.8 F (36.6 C)] 97.1 F (36.2 C) (09/08 0355) Pulse Rate:  [69-76] 69  (09/08 0300) Resp:  [18-19] 19  (09/08 0300) BP: (95-142)/(52-72) 95/52 mmHg (09/08 0300) SpO2:  [95 %-98 %] 95 % (09/08 0300) Weight:  [56 kg (123 lb 7.3 oz)] 56 kg (123 lb 7.3 oz) (09/08 0500) Weight change: 2 kg (4 lb 6.6 oz)  Intake/Output from previous day: 09/07 0701 - 09/08 0700 In: 1057.5 [P.O.:840; I.V.:217.5] Out: 801 [Urine:800; Stool:1]  Intake/Output from this shift:    Physical Exam: General appearance: alert, cooperative, appears stated age and no distress Neck: no adenopathy, no carotid bruit, no JVD, supple, symmetrical, trachea midline and thyroid not enlarged, symmetric, no tenderness/mass/nodules Lungs: clear to auscultation bilaterally Heart: irregularly irregular rhythm Extremities: extremities normal, atraumatic, no cyanosis or edema  Lab Results: Results for orders placed during the hospital encounter of 12/17/11 (from the past 48 hour(s))  CBC WITH DIFFERENTIAL     Status: Normal   Collection Time   12/17/11  9:16 AM      Component Value Range Comment   WBC 6.5  4.0 - 10.5 K/uL    RBC 4.40  3.87 - 5.11 MIL/uL    Hemoglobin 13.3  12.0 - 15.0 g/dL    HCT 40.9  81.1 - 91.4 %    MCV 89.3  78.0 - 100.0 fL    MCH 30.2  26.0 - 34.0 pg    MCHC 33.8  30.0 - 36.0 g/dL    RDW 78.2  95.6 - 21.3 %    Platelets 256  150 - 400 K/uL    Neutrophils Relative 72  43 - 77 %    Neutro Abs 4.7  1.7 - 7.7 K/uL    Lymphocytes Relative 17  12 - 46 %    Lymphs Abs 1.1  0.7 - 4.0 K/uL    Monocytes Relative 8  3 - 12 %    Monocytes Absolute 0.5  0.1 - 1.0 K/uL    Eosinophils Relative 2  0 - 5 %    Eosinophils Absolute 0.2  0.0 - 0.7 K/uL    Basophils Relative 1  0 - 1 %    Basophils Absolute 0.0  0.0 - 0.1 K/uL   PROTIME-INR     Status: Abnormal   Collection Time   12/17/11  9:16 AM      Component Value Range Comment   Prothrombin  Time 17.1 (*) 11.6 - 15.2 seconds    INR 1.37  0.00 - 1.49   TROPONIN I     Status: Normal   Collection Time   12/17/11  9:16 AM      Component Value Range Comment   Troponin I <0.30  <0.30 ng/mL   POCT I-STAT, CHEM 8     Status: Normal   Collection Time   12/17/11  9:37 AM      Component Value Range Comment   Sodium 141  135 - 145 mEq/L    Potassium 3.5  3.5 - 5.1 mEq/L    Chloride 106  96 - 112 mEq/L    BUN 7  6 - 23 mg/dL    Creatinine, Ser 0.86  0.50 - 1.10 mg/dL    Glucose, Bld 90  70 - 99 mg/dL    Calcium, Ion 5.78  4.69 - 1.30 mmol/L    TCO2 25  0 - 100 mmol/L    Hemoglobin 13.6  12.0 - 15.0 g/dL    HCT 65.7  84.6 - 96.2 %   HEPATIC FUNCTION PANEL     Status: Normal   Collection Time   12/17/11  2:20 PM      Component Value Range Comment   Total Protein 7.0  6.0 - 8.3 g/dL    Albumin 3.9  3.5 - 5.2 g/dL    AST 22  0 - 37 U/L    ALT 15  0 - 35 U/L    Alkaline Phosphatase 80  39 - 117 U/L    Total Bilirubin 0.6  0.3 - 1.2 mg/dL    Bilirubin, Direct 0.1  0.0 - 0.3 mg/dL    Indirect Bilirubin 0.5  0.3 - 0.9 mg/dL   TSH     Status: Normal   Collection Time   12/17/11  2:20 PM      Component Value Range Comment   TSH 2.794  0.350 - 4.500 uIU/mL   CK TOTAL AND CKMB     Status: Normal   Collection Time   12/17/11  2:20 PM      Component Value Range Comment   Total CK 69  7 - 177 U/L    CK, MB 2.9  0.3 - 4.0 ng/mL    Relative Index RELATIVE INDEX IS INVALID  0.0 - 2.5   TROPONIN I     Status: Normal   Collection Time   12/17/11  2:20 PM      Component Value Range Comment   Troponin I <0.30  <0.30 ng/mL   MRSA PCR SCREENING     Status: Normal   Collection Time   12/17/11  5:31 PM      Component Value Range Comment   MRSA by PCR NEGATIVE  NEGATIVE   TROPONIN I     Status: Normal   Collection Time   12/17/11  8:46 PM      Component Value Range Comment   Troponin I <0.30  <0.30 ng/mL   TROPONIN I     Status: Normal   Collection Time   12/18/11  2:05 AM      Component Value  Range Comment   Troponin I <0.30  <0.30 ng/mL   MAGNESIUM     Status: Normal   Collection Time   12/18/11  2:05 AM      Component Value Range Comment   Magnesium 1.9  1.5 - 2.5 mg/dL   CBC     Status: Normal   Collection Time   12/18/11  2:05 AM      Component Value Range Comment   WBC 7.2  4.0 - 10.5 K/uL    RBC 4.33  3.87 - 5.11 MIL/uL    Hemoglobin 13.2  12.0 - 15.0 g/dL    HCT 95.2  84.1 - 32.4 %    MCV 88.9  78.0 - 100.0 fL    MCH 30.5  26.0 - 34.0 pg    MCHC 34.3  30.0 - 36.0 g/dL    RDW 40.1  02.7 - 25.3 %    Platelets 258  150 - 400 K/uL   BASIC METABOLIC PANEL     Status: Abnormal   Collection Time   12/18/11  2:05 AM      Component Value Range Comment   Sodium 139  135 - 145 mEq/L    Potassium 3.7  3.5 - 5.1 mEq/L    Chloride 101  96 - 112 mEq/L    CO2 27  19 -  32 mEq/L    Glucose, Bld 96  70 - 99 mg/dL    BUN 9  6 - 23 mg/dL    Creatinine, Ser 1.61  0.50 - 1.10 mg/dL    Calcium 9.9  8.4 - 09.6 mg/dL    GFR calc non Af Amer 80 (*) >90 mL/min    GFR calc Af Amer >90  >90 mL/min   PROTIME-INR     Status: Abnormal   Collection Time   12/18/11  2:05 AM      Component Value Range Comment   Prothrombin Time 27.3 (*) 11.6 - 15.2 seconds    INR 2.49 (*) 0.00 - 1.49   GLUCOSE, CAPILLARY     Status: Abnormal   Collection Time   12/18/11 11:45 AM      Component Value Range Comment   Glucose-Capillary 134 (*) 70 - 99 mg/dL   HEPARIN LEVEL (UNFRACTIONATED)     Status: Normal   Collection Time   12/19/11  5:25 AM      Component Value Range Comment   Heparin Unfractionated 0.52  0.30 - 0.70 IU/mL   CBC     Status: Normal   Collection Time   12/19/11  5:25 AM      Component Value Range Comment   WBC 9.1  4.0 - 10.5 K/uL    RBC 4.58  3.87 - 5.11 MIL/uL    Hemoglobin 13.7  12.0 - 15.0 g/dL    HCT 04.5  40.9 - 81.1 %    MCV 89.3  78.0 - 100.0 fL    MCH 29.9  26.0 - 34.0 pg    MCHC 33.5  30.0 - 36.0 g/dL    RDW 91.4  78.2 - 95.6 %    Platelets 257  150 - 400 K/uL   APTT      Status: Abnormal   Collection Time   12/19/11  5:25 AM      Component Value Range Comment   aPTT 50 (*) 24 - 37 seconds     Imaging: Imaging results have been reviewed  Assessment/Plan:   1. Principal Problem: 2.  *Chest pain at rest 3. Active Problems: 4.  SOB (shortness of breath) 5.  CAD (coronary artery disease) 6.  CVA (cerebral vascular accident) 7.  Chronic atrial fibrillation - AA thrombus 8.  Anticoagulated 9.   Time Spent Directly with Patient:  20 minutes On IV hep/NTG. One episode of SSCP. Enz neg. Labs OK. Exam benign. AFIB with CVR. For cath tomorroow.  Length of Stay:  LOS: 2 days    Talina Pleitez J 12/19/2011, 7:40 AM

## 2011-12-19 NOTE — Progress Notes (Signed)
ANTICOAGULATION CONSULT NOTE   Pharmacy Consult for Heparin Indication: chest pain/ACS  Allergies  Allergen Reactions  . Celebrex (Celecoxib)   . Ciprofloxacin Hcl Other (See Comments)    unknown  . Codeine Other (See Comments)    unknown  . Doxycycline Other (See Comments)    unknown  . Famotidine Other (See Comments)    unknown  . Levofloxacin Other (See Comments)    unknown  . Macrolides And Ketolides Other (See Comments)    unknown  . Penicillins   . Plavix (Clopidogrel Bisulfate)   . Warfarin Sodium Other (See Comments)    Makes my skin bleed under the skin    Patient Measurements: Height: 5\' 2"  (157.5 cm) Weight: 123 lb 7.3 oz (56 kg) IBW/kg (Calculated) : 50.1  Heparin Dosing Weight: 54 kg   Vital Signs: Temp: 98 F (36.7 C) (09/08 1500) Temp src: Oral (09/08 1500)  Labs:  Basename 12/19/11 1540 12/19/11 0525 12/18/11 0205 12/17/11 2046 12/17/11 1420 12/17/11 0937 12/17/11 0916  HGB -- 13.7 13.2 -- -- -- --  HCT -- 40.9 38.5 -- -- 40.0 --  PLT -- 257 258 -- -- -- 256  APTT 58* 50* -- -- -- -- --  LABPROT -- -- 27.3* -- -- -- 17.1*  INR -- -- 2.49* -- -- -- 1.37  HEPARINUNFRC 0.11* 0.52 -- -- -- -- --  CREATININE -- -- 0.59 -- -- 0.70 --  CKTOTAL -- -- -- -- 69 -- --  CKMB -- -- -- -- 2.9 -- --  TROPONINI -- -- <0.30 <0.30 <0.30 -- --    Estimated Creatinine Clearance: 39.2 ml/min (by C-G formula based on Cr of 0.59).  Assessment: 78 yo female with h/o Afib, rivaroxaban on hold, for anticoagulation. Heparin level is 0.11 (aPTT=58) at 600 units/hr and below goal. Initial level may be an effect due to rivaroxaban,    Goal of Therapy:  Heparin level 0.3-0.7 units/ml Monitor platelets by anticoagulation protocol: Yes   Plan:  -Heparin bolus 2000 units IV then increase to 750 units/hr -Heparin level in 8hrs  Harland German, Pharm D 12/19/2011 4:55 PM

## 2011-12-19 NOTE — Progress Notes (Signed)
ANTICOAGULATION CONSULT NOTE   Pharmacy Consult for Heparin Indication: chest pain/ACS  Allergies  Allergen Reactions  . Celebrex (Celecoxib)   . Ciprofloxacin Hcl Other (See Comments)    unknown  . Codeine Other (See Comments)    unknown  . Doxycycline Other (See Comments)    unknown  . Famotidine Other (See Comments)    unknown  . Levofloxacin Other (See Comments)    unknown  . Macrolides And Ketolides Other (See Comments)    unknown  . Penicillins   . Plavix (Clopidogrel Bisulfate)   . Warfarin Sodium Other (See Comments)    Makes my skin bleed under the skin    Patient Measurements: Height: 5\' 2"  (157.5 cm) Weight: 123 lb 7.3 oz (56 kg) IBW/kg (Calculated) : 50.1  Heparin Dosing Weight: 54 kg   Vital Signs: Temp: 97.1 F (36.2 C) (09/08 0355) Temp src: Oral (09/08 0355) BP: 95/52 mmHg (09/08 0300) Pulse Rate: 69  (09/08 0300)  Labs:  Basename 12/19/11 0525 12/18/11 0205 12/17/11 2046 12/17/11 1420 12/17/11 0937 12/17/11 0916  HGB 13.7 13.2 -- -- -- --  HCT 40.9 38.5 -- -- 40.0 --  PLT 257 258 -- -- -- 256  APTT 50* -- -- -- -- --  LABPROT -- 27.3* -- -- -- 17.1*  INR -- 2.49* -- -- -- 1.37  HEPARINUNFRC 0.52 -- -- -- -- --  CREATININE -- 0.59 -- -- 0.70 --  CKTOTAL -- -- -- 69 -- --  CKMB -- -- -- 2.9 -- --  TROPONINI -- <0.30 <0.30 <0.30 -- --    Estimated Creatinine Clearance: 39.2 ml/min (by C-G formula based on Cr of 0.59).  Assessment: 78 yo female with h/o Afib, rivaroxaban on hold, for anticoagulation    Goal of Therapy:  Heparin level 0.3-0.7 units/ml Monitor platelets by anticoagulation protocol: Yes   Plan:  Continue Heparin at current rate Check heparin level in 8 hours to verify in range  Eddie Candle 12/19/2011,6:45 AM

## 2011-12-20 ENCOUNTER — Encounter (HOSPITAL_COMMUNITY)
Admission: EM | Disposition: A | Discharge status: Home / Self Care | Payer: Self-pay | Source: Home / Self Care | Attending: Cardiology

## 2011-12-20 DIAGNOSIS — J449 Chronic obstructive pulmonary disease, unspecified: Secondary | ICD-10-CM | POA: Diagnosis present

## 2011-12-20 HISTORY — PX: LEFT HEART CATHETERIZATION WITH CORONARY/GRAFT ANGIOGRAM: SHX5450

## 2011-12-20 LAB — BASIC METABOLIC PANEL
BUN: 16 mg/dL (ref 6–23)
CO2: 29 mEq/L (ref 19–32)
Chloride: 104 mEq/L (ref 96–112)
GFR calc non Af Amer: 75 mL/min — ABNORMAL LOW (ref >90)
Glucose, Bld: 93 mg/dL (ref 70–99)
Potassium: 4 mEq/L (ref 3.5–5.1)

## 2011-12-20 LAB — HEPARIN LEVEL (UNFRACTIONATED): Heparin Unfractionated: 0.28 IU/mL — ABNORMAL LOW (ref 0.30–0.70)

## 2011-12-20 LAB — PROTIME-INR: INR: 1.07 (ref 0.00–1.49)

## 2011-12-20 LAB — CBC
HCT: 39.9 % (ref 36.0–46.0)
Hemoglobin: 13.4 g/dL (ref 12.0–15.0)
MCHC: 33.6 g/dL (ref 30.0–36.0)
RBC: 4.46 MIL/uL (ref 3.87–5.11)

## 2011-12-20 LAB — POCT ACTIVATED CLOTTING TIME: Activated Clotting Time: 150 seconds

## 2011-12-20 SURGERY — LEFT HEART CATHETERIZATION WITH CORONARY/GRAFT ANGIOGRAM

## 2011-12-20 MED ORDER — MORPHINE SULFATE 4 MG/ML IJ SOLN: 1.0000 mg | INTRAMUSCULAR | Status: DC | PRN

## 2011-12-20 MED ORDER — NITROGLYCERIN 0.2 MG/ML ON CALL CATH LAB
INTRAVENOUS | Status: AC
  Filled 2011-12-20: qty 1

## 2011-12-20 MED ORDER — ASPIRIN 81 MG PO CHEW
81.0000 mg | CHEWABLE_TABLET | Freq: Every day | ORAL | Status: DC
  Administered 2011-12-21: 81 mg via ORAL
  Filled 2011-12-20: qty 1

## 2011-12-20 MED ORDER — HEPARIN (PORCINE) IN NACL 2-0.9 UNIT/ML-% IJ SOLN
INTRAMUSCULAR | Status: AC
  Filled 2011-12-20: qty 2000

## 2011-12-20 MED ORDER — ACETAMINOPHEN 325 MG PO TABS: 650.0000 mg | ORAL_TABLET | ORAL | Status: DC | PRN

## 2011-12-20 MED ORDER — FENTANYL CITRATE 0.05 MG/ML IJ SOLN
INTRAMUSCULAR | Status: AC
  Filled 2011-12-20: qty 2

## 2011-12-20 MED ORDER — SODIUM CHLORIDE 0.9 % IV SOLN: INTRAVENOUS | Status: AC

## 2011-12-20 MED ORDER — MIDAZOLAM HCL 2 MG/2ML IJ SOLN
INTRAMUSCULAR | Status: AC
  Filled 2011-12-20: qty 2

## 2011-12-20 MED ORDER — HEPARIN (PORCINE) IN NACL 100-0.45 UNIT/ML-% IJ SOLN
850.0000 [IU]/h | INTRAMUSCULAR | Status: DC
  Filled 2011-12-20: qty 250

## 2011-12-20 MED ORDER — RIVAROXABAN 15 MG PO TABS
15.0000 mg | ORAL_TABLET | Freq: Every day | ORAL | Status: DC
  Administered 2011-12-20 – 2011-12-21 (×2): 15 mg via ORAL
  Filled 2011-12-20 (×2): qty 1

## 2011-12-20 MED ORDER — LIDOCAINE HCL (PF) 1 % IJ SOLN
INTRAMUSCULAR | Status: AC
  Filled 2011-12-20: qty 30

## 2011-12-20 MED ORDER — ONDANSETRON HCL 4 MG/2ML IJ SOLN: 4.0000 mg | Freq: Four times a day (QID) | INTRAMUSCULAR | Status: DC | PRN

## 2011-12-20 NOTE — Progress Notes (Signed)
Subjective:  No further chest discomfort  Objective:  Vital Signs in the last 24 hours: Temp:  [97.5 F (36.4 C)-98.4 F (36.9 C)] 98.4 F (36.9 C) (09/09 0744) Pulse Rate:  [69-89] 69  (09/09 0744) Resp:  [14-21] 21  (09/09 0700) BP: (103-158)/(43-79) 143/77 mmHg (09/09 0700) SpO2:  [96 %-98 %] 96 % (09/09 0744) Weight:  [56 kg (123 lb 7.3 oz)] 56 kg (123 lb 7.3 oz) (09/08 1119)  Intake/Output from previous day:  Intake/Output Summary (Last 24 hours) at 12/20/11 0949 Last data filed at 12/20/11 0700  Gross per 24 hour  Intake 645.15 ml  Output      0 ml  Net 645.15 ml    Physical Exam: General appearance: alert, cooperative and no distress Lungs: clear to auscultation bilaterally Heart: irregularly irregular rhythm   Rate: 78  Rhythm: atrial fibrillation  Lab Results:  Basename 12/20/11 0550 12/19/11 0525  WBC 7.5 9.1  HGB 13.4 13.7  PLT 257 257    Basename 12/20/11 0550 12/18/11 0205  NA 139 139  K 4.0 3.7  CL 104 101  CO2 29 27  GLUCOSE 93 96  BUN 16 9  CREATININE 0.71 0.59    Basename 12/18/11 0205 12/17/11 2046  TROPONINI <0.30 <0.30   Hepatic Function Panel  Basename 12/17/11 1420  PROT 7.0  ALBUMIN 3.9  AST 22  ALT 15  ALKPHOS 80  BILITOT 0.6  BILIDIR 0.1  IBILI 0.5   No results found for this basename: CHOL in the last 72 hours  Basename 12/20/11 0550  INR 1.07    Imaging: Imaging results have been reviewed  Cardiac Studies:  Assessment/Plan:   Principal Problem:  *Chest pain at rest Active Problems:  SOB (shortness of breath)  CAD, CABG 1998, cath March 2012- medical Rx  CVA (cerebral vascular accident)  Chronic atrial fibrillation - AA thrombus  Anticoagulated, Xarelto  Splenic infarct, embolic, June 2012  Bradycardia in the past  COPD on CXR   Plan- Cath today, resume anticoagulation post cath.   Corine Shelter PA-C 12/20/2011, 9:49 AM  Agree with note written by Corine Shelter PAC  No further CP/SOB. On IV  hep/NTG. Exam benign. Labs OK. CAF with CVR. For cath today.    [name] 12/20/2011 10:37 AM

## 2011-12-20 NOTE — H&P (Signed)
    Pt was reexamined and existing H & P reviewed. No changes found.  [name], MD [facility] 12/20/2011 3:23 PM

## 2011-12-20 NOTE — Op Note (Signed)
Cathy Ortega is a 78 y.o. female    [id number] LOCATION:  FACILITY: [facility]  PHYSICIAN: Nanetta Batty, M.D. 03-Dec-1922   DATE OF PROCEDURE:  12/20/2011  DATE OF DISCHARGE:  [facility]  CARDIAC CATHETERIZATION     History obtained from chart review.78 year old WF well known to Dr. Allyson Sabal with history of CAD status post bypass graft in 1998. She was catheterized in 2000 and again in 2005, as well as 2010, revealing patent grafts with an atretic LIMA. Other problems include hypertension, hyperlipidemia, and paroxysmal atrial fibrillation, now chronic.  Myoview performed 2012 was nonischemic. Coumadin at one point was stopped due to her erratic INRs and began her on aspirin and Plavix. Unfortunately, she did have a stroke on February 28, 2010,  through March 04, 2010, with some left upper extremity motor weakness. She underwent cerebral angiography revealing some distal right common carotid stenosis. She was discharged home and changed from Plavix to now xarelto.  Last 2-3 months patient has had increasing shortness of breath at rest and with exertion. He is aware of this and was planning on cardiovascular test. Yesterday the patient Began having Chest pressure midsternal with radiation to her back associated with shortness of breath. She went to bed slept until 3 AM and again awoke with chest pressure with radiation to her back. The pain was initially described 10/10 now it is down to 5/10. No associated nausea or vomiting and no diaphoresis.  Last cath 07/2010 without change from 2010, with VG to PDA patent, VG to OM patent, VG to diag. Patent with retrograde filling down the LAD and atreptic LIMA.  Last nuc study was before the cardiac Cath and was low risk scan but she continued with chest pain.  Last TEE 03/2011 with continued lt. Atrial appendage thrombus. She continues in chronic atrial fibrillation on Xarelto.  In June in 2012 she had a splenic infarction.  Embolic source. She was admitted with chest pain, rule out myocardial infarction. Her enzymes were negative. She remained pain-free on IV heparin and nitroglycerin. Her [place] Ramp  was held and she presents now for diagnostic coronary arteriography to rule out progression of disease.    PROCEDURE DESCRIPTION:    The patient was brought to the second floor  [place] [facility] cath lab in the postabsorptive state. She was  premedicated with Valium 5 mg by mouth, IV Versed and fentanyl.. Her right groin was prepped and shaved in usual sterile fashion. Xylocaine 1% was used  for local anesthesia. A 5 French sheath was inserted into the right common femoral  artery using standard Seldinger technique. 5 French right and left Judkins diagnostic catheters along the 5 French pigtail catheter used for selective coronary angiography, selective vein graft angiography, subselective left internal mammary artery angiography, and left ventriculography. Visipaque dye was used for the entirety of the case. Retrograde aortic, left ventricular pullback pressures were recorded.  HEMODYNAMICS:    AO SYSTOLIC/AO DIASTOLIC: 141/60   LV SYSTOLIC/LV DIASTOLIC: 154/7  ANGIOGRAPHIC RESULTS:   1. Left main; normal  2. LAD; occluded after the first small diagonal branch and septal perforator 3. Left circumflex; 80% segmental mid-AV groove followed by total occlusion after the small second marginal branch.  4. Right coronary artery; totally occluded proximally with antegrade collaterals, 95% stenosis at the "genu" of the vessel 5.LIMA TO LAD; atretic 6. SVG TO diagonal branch was widely patent with backfilling of the LAD which itself had diffuse 95% segmental stenosis in the mid  to distal portion     SVG TO PDA was widely patent although the graft itself was a relatively small caliber graft but unchanged from the prior study     SVG TO distal OM was widely patent   7. Left ventriculography; RAO left ventriculogram was  performed using  25 mL of Visipaque dye at 12 mL/second. The overall LVEF estimated  70 % Without wall motion abnormalities  IMPRESSION:Cathy Ortega has essentially unchanged anatomy compared to her prior cath. Her vein grafts are widely patent as they were on her previous cath in her LIMA was atretic. Her LV function is normal. Certainly she could be having ischemia in the LAD territory this is a small diffusely diseased vessel. Continued medical therapy will be recommended. The sheath was removed and pressure was held on the groin to achieve hemostasis. The patient left the Cath Lab in stable condition. She'll be gently hydrated and will restart [employer].  [name] MD, [facility] 12/20/2011 4:01 PM

## 2011-12-20 NOTE — Progress Notes (Signed)
ANTICOAGULATION CONSULT NOTE   Pharmacy Consult for Heparin Indication: chest pain/ACS  Patient Measurements: Height: 5\' 2"  (157.5 cm) Weight: 123 lb 7.3 oz (56 kg) IBW/kg (Calculated) : 50.1  Heparin Dosing Weight: 54 kg   Vital Signs: Temp: 98.4 F (36.9 C) (09/09 0744) Temp src: Oral (09/09 0744) BP: 143/77 mmHg (09/09 0700) Pulse Rate: 69  (09/09 0744)  Labs:  Basename 12/20/11 4782 12/20/11 0550 12/19/11 1540 12/19/11 0525 12/18/11 0205 12/17/11 2046 12/17/11 1420 12/17/11 0937  HGB -- 13.4 -- 13.7 -- -- -- --  HCT -- 39.9 -- 40.9 38.5 -- -- --  PLT -- 257 -- 257 258 -- -- --  APTT -- -- 58* 50* -- -- -- --  LABPROT -- 14.1 -- -- 27.3* -- -- --  INR -- 1.07 -- -- 2.49* -- -- --  HEPARINUNFRC 0.28* -- 0.11* 0.52 -- -- -- --  CREATININE -- 0.71 -- -- 0.59 -- -- 0.70  CKTOTAL -- -- -- -- -- -- 69 --  CKMB -- -- -- -- -- -- 2.9 --  TROPONINI -- -- -- -- <0.30 <0.30 <0.30 --    Estimated Creatinine Clearance: 39.2 ml/min (by C-G formula based on Cr of 0.71).  Assessment: 78 yo female with h/o Afib, rivaroxaban on hold, for anticoagulation. Heparin level is 0.28 just below goal on 750 units/hr. CBC has remained stable, no signs of bleeding complications. Plan is for cath this morning.    Goal of Therapy:  Heparin level 0.3-0.7 units/ml Monitor platelets by anticoagulation protocol: Yes   Plan:  -Increase heparin rate to 850 units/hr -follow after cath  Sheppard Coil, Pharm D 12/20/2011 9:24 AM

## 2011-12-20 NOTE — Care Management Note (Addendum)
    Page 1 of 2   12/21/2011     12:04:17 PM   CARE MANAGEMENT NOTE 12/21/2011  Patient:  Cathy Ortega, Cathy Ortega   Account Number:  [id number]  Date Initiated:  12/20/2011  Documentation initiated by:  Junius Creamer  Subjective/Objective Assessment:   adm w ch pain     Action/Plan:   lives w fam, pcp dr Assunta Found   Anticipated DC Date:  12/21/2011   Anticipated DC Plan:  HOME/SELF CARE      DC Planning Services  CM consult      PAC Choice  DURABLE MEDICAL EQUIPMENT   Choice offered to / List presented to:  C-1 Patient   DME arranged  OTHER - SEE COMMENT      DME agency  [facility]        Status of service:   Medicare Important Message given?   (If response is "NO", the following Medicare IM given date fields will be blank) Date Medicare IM given:   Date Additional Medicare IM given:    Discharge Disposition:  HOME/SELF CARE  Per UR Regulation:  Reviewed for med. necessity/level of care/duration of stay  If discussed at Long Length of Stay Meetings, dates discussed:    Comments:  9/10 9:40a debbie Avrom Robarts rn,bsn spoke w pt and da. gave them 10day free card for xarelto. pt has been on this pta but it has been over a year since they used free card. pt has bsc-walker-cane but would like to have rolator-rw w seat. have asked md for order. pt's da lives w her and is very supportive. plans to return home after disch.order for rolator rw w seat, darian w [facility] del to room for pt.  9/9 12:29p debbie Ariyanah Aguado rn,bsn [phone]

## 2011-12-21 LAB — CBC
MCH: 29.8 pg (ref 26.0–34.0)
MCHC: 33.6 g/dL (ref 30.0–36.0)
Platelets: 258 10*3/uL (ref 150–400)
RBC: 4.33 MIL/uL (ref 3.87–5.11)

## 2011-12-21 MED ORDER — ISOSORBIDE MONONITRATE 15 MG HALF TABLET
15.0000 mg | ORAL_TABLET | Freq: Every day | ORAL | Status: DC
  Administered 2011-12-21: 15 mg via ORAL
  Filled 2011-12-21: qty 1

## 2011-12-21 MED ORDER — NITROGLYCERIN 0.4 MG SL SUBL: 0.4000 mg | SUBLINGUAL_TABLET | SUBLINGUAL | Status: DC | PRN

## 2011-12-21 MED ORDER — ISOSORBIDE MONONITRATE ER 60 MG PO TB24: 60.0000 mg | ORAL_TABLET | Freq: Every day | ORAL | Status: DC

## 2011-12-21 MED ORDER — RANOLAZINE ER 500 MG PO TB12: 500.0000 mg | ORAL_TABLET | Freq: Two times a day (BID) | ORAL | Status: DC

## 2011-12-21 NOTE — Discharge Summary (Signed)
Patient ID: Cathy Ortega,  MRN: [id number], DOB/AGE: 76-Jan-1924 76 y.o.  Admit date: 12/17/2011 Discharge date: 12/21/2011  Primary Care Provider: Primary Cardiologist: Dr Allyson Sabal [facility])  Discharge Diagnoses Principal Problem:  *Chest pain at rest Active Problems:  SOB (shortness of breath)  CAD, CABG 1998, cath March 2012- medical Rx  CVA (cerebral vascular accident)  Chronic atrial fibrillation - AA thrombus  Anticoagulated, Xarelto  Splenic infarct, embolic, June 2012  Bradycardia in the past  COPD on CXR    Procedures: Coronoary angiogram 12/20/11  Hospital course:  Cathy Ortega is an 78 year old WF well known to Dr. Allyson Sabal with history of CAD status post bypass graft in 1998. She was catheterized in 2000 and again in 2005, as well as 2010, revealing patent grafts with an atretic LIMA. Other problems include hypertension, hyperlipidemia, and paroxysmal atrial fibrillation, now chronic. Myoview performed 2012 was nonischemic. Coumadin at one point was stopped due to her erratic INRs and began her on aspirin and Plavix.Unfortunately, she did have a stroke on February 28, 2010. She underwent cerebral angiography revealing some distal right common carotid stenosis. She was discharged home and changed from Plavix to now Xarelto. Over the last ast 2-3 months patient has had increasing shortness of breath at rest and with exertion. On 12/16/11  the patient Began having Chest pressure midsternal with radiation to her back associated with shortness of breath. She went to bed slept until 3 AM and again awoke with chest pressure with radiation to her back. The pain was initially described 10/10 now it is down to 5/10. No associated nausea or vomiting and no diaphoresis. She was admitted and placed on Heparin and NTG. MI was ruled out. Cath was done 12/20/11 and revealed essentially unchanged anatomy compared to her prior cath. Her vein grafts are widely patent as they were on her previous cath in her LIMA  was atretic. Her LV function is normal. Certainly she could be having ischemia in the LAD territory this is a small diffusely diseased vessel. Continued medical therapy will be recommended. Xarelto was restarted and the pt can be discharged on 12/21/11. She has been on a high dose of nitrates as an OP so Ranexa was started. She will get sample from the office to see if this helps before filling her Rx.   Hospital Course:   Discharge Vitals:  Blood pressure 157/59, pulse 84, temperature 97.8 F (36.6 C), temperature source Oral, resp. rate 20, height 5\' 2"  (1.575 m), weight 51.8 kg (114 lb 3.2 oz), SpO2 96.00%.    Labs: Results for orders placed during the hospital encounter of 12/17/11 (from the past 48 hour(s))  HEPARIN LEVEL (UNFRACTIONATED)     Status: Abnormal   Collection Time   12/19/11  3:40 PM      Component Value Range Comment   Heparin Unfractionated 0.11 (*) 0.30 - 0.70 IU/mL   APTT     Status: Abnormal   Collection Time   12/19/11  3:40 PM      Component Value Range Comment   aPTT 58 (*) 24 - 37 seconds   CBC     Status: Normal   Collection Time   12/20/11  5:50 AM      Component Value Range Comment   WBC 7.5  4.0 - 10.5 K/uL    RBC 4.46  3.87 - 5.11 MIL/uL    Hemoglobin 13.4  12.0 - 15.0 g/dL    HCT 84.6  96.2 - 95.2 %  MCV 89.5  78.0 - 100.0 fL    MCH 30.0  26.0 - 34.0 pg    MCHC 33.6  30.0 - 36.0 g/dL    RDW 16.1  09.6 - 04.5 %    Platelets 257  150 - 400 K/uL   BASIC METABOLIC PANEL     Status: Abnormal   Collection Time   12/20/11  5:50 AM      Component Value Range Comment   Sodium 139  135 - 145 mEq/L    Potassium 4.0  3.5 - 5.1 mEq/L    Chloride 104  96 - 112 mEq/L    CO2 29  19 - 32 mEq/L    Glucose, Bld 93  70 - 99 mg/dL    BUN 16  6 - 23 mg/dL    Creatinine, Ser 4.09  0.50 - 1.10 mg/dL    Calcium 81.1  8.4 - 10.5 mg/dL    GFR calc non Af Amer 75 (*) >90 mL/min    GFR calc Af Amer 87 (*) >90 mL/min   PROTIME-INR     Status: Normal   Collection Time    12/20/11  5:50 AM      Component Value Range Comment   Prothrombin Time 14.1  11.6 - 15.2 seconds    INR 1.07  0.00 - 1.49   HEPARIN LEVEL (UNFRACTIONATED)     Status: Abnormal   Collection Time   12/20/11  6:47 AM      Component Value Range Comment   Heparin Unfractionated 0.28 (*) 0.30 - 0.70 IU/mL   POCT ACTIVATED CLOTTING TIME     Status: Normal   Collection Time   12/20/11  3:48 PM      Component Value Range Comment   Activated Clotting Time 150     CBC     Status: Normal   Collection Time   12/21/11  4:10 AM      Component Value Range Comment   WBC 6.1  4.0 - 10.5 K/uL    RBC 4.33  3.87 - 5.11 MIL/uL    Hemoglobin 12.9  12.0 - 15.0 g/dL    HCT 91.4  78.2 - 95.6 %    MCV 88.7  78.0 - 100.0 fL    MCH 29.8  26.0 - 34.0 pg    MCHC 33.6  30.0 - 36.0 g/dL    RDW 21.3  08.6 - 57.8 %    Platelets 258  150 - 400 K/uL     Disposition:  Follow-up Information    Follow up with [name], MD on 12/21/2011. [facility] office will call you)    Contact information:   [address] [place] [address] [phone]          Discharge Medications:  Medication List  As of 12/21/2011 11:23 AM   TAKE these medications         acetaminophen 325 MG tablet   Commonly known as: TYLENOL   Take 650 mg by mouth every 6 (six) hours as needed. For pain or fever      aspirin 81 MG tablet   Take 81 mg by mouth daily.      cholecalciferol 1000 UNITS tablet   Commonly known as: VITAMIN D   Take 2,000 Units by mouth daily.      ezetimibe 10 MG tablet   Commonly known as: ZETIA   Take 10 mg by mouth daily.      fluvastatin XL 80 MG 24  hr tablet   Commonly known as: LESCOL XL   Take 80 mg by mouth daily.      isosorbide mononitrate 60 MG 24 hr tablet   Commonly known as: IMDUR   Take 1 tablet (60 mg total) by mouth daily.      meloxicam 7.5 MG tablet   Commonly known as: MOBIC   Take 7.5 mg by mouth 2 (two) times daily as needed. For pain       metoprolol succinate 50 MG 24 hr tablet   Commonly known as: TOPROL-XL   Take 50 mg by mouth daily.      multivitamin with minerals Tabs   Take 1 tablet by mouth daily.      nitroGLYCERIN 0.4 MG SL tablet   Commonly known as: NITROSTAT   Place 1 tablet (0.4 mg total) under the tongue every 5 (five) minutes x 3 doses as needed for chest pain.      pantoprazole 40 MG tablet   Commonly known as: PROTONIX   Take 40 mg by mouth daily.      polyethylene glycol packet   Commonly known as: MIRALAX / GLYCOLAX   Take 17 g by mouth daily.      ranolazine 500 MG 12 hr tablet   Commonly known as: RANEXA   Take 1 tablet (500 mg total) by mouth 2 (two) times daily.      Rivaroxaban 15 MG Tabs tablet   Commonly known as: XARELTO   Take 15 mg by mouth daily.      zolpidem 10 MG tablet   Commonly known as: AMBIEN   Take 10 mg by mouth at bedtime.            Duration of Discharge Encounter: Greater than 30 minutes including physician time.  Jolene Provost PA-C 12/21/2011 11:23 AM

## 2011-12-21 NOTE — Progress Notes (Signed)
[facility]  DAILY PROGRESS NOTE   Subjective:  Feels well today, no chest pain. Was able to ambulate around the unit without problems.  Objective:  Temp:  [97.4 F (36.3 C)-98 F (36.7 C)] 97.7 F (36.5 C) (09/10 0400) Pulse Rate:  [64-94] 81  (09/10 0400) Resp:  [16-21] 20  (09/10 0400) BP: (106-158)/(47-79) 106/47 mmHg (09/10 0400) SpO2:  [96 %-98 %] 97 % (09/10 0400) Weight:  [51.8 kg (114 lb 3.2 oz)] 51.8 kg (114 lb 3.2 oz) (09/10 0500) Weight change: -4.2 kg (-9 lb 4.2 oz)  Intake/Output from previous day: 09/09 0701 - 09/10 0700 In: 909.5 [I.V.:909.5] Out: 1075 [Urine:1075]  Intake/Output from this shift:    Medications: Current Facility-Administered Medications  Medication Dose Route Frequency Provider Last Rate Last Dose  . 0.9 %  sodium chloride infusion   Intravenous Continuous [name], MD 75 mL/hr at 12/20/11 1800    . acetaminophen (TYLENOL) tablet 650 mg  650 mg Oral Q4H PRN Nada Boozer, NP   650 mg at 12/20/11 2200  . ALPRAZolam Prudy Feeler) tablet 0.25 mg  0.25 mg Oral BID PRN Nada Boozer, NP      . aspirin chewable tablet 81 mg  81 mg Oral Daily [name], MD      . ezetimibe (ZETIA) tablet 10 mg  10 mg Oral Daily Nada Boozer, NP   10 mg at 12/20/11 1056  . fentaNYL (SUBLIMAZE) 0.05 MG/ML injection           . heparin 2-0.9 UNIT/ML-% infusion           . lidocaine (XYLOCAINE) 1 % injection           . metoprolol succinate (TOPROL-XL) 24 hr tablet 50 mg  50 mg Oral Daily Nada Boozer, NP   50 mg at 12/20/11 1056  . midazolam (VERSED) 2 MG/2ML injection           . morphine 4 MG/ML injection 1 mg  1 mg Intravenous Q1H PRN [name], MD      . nitroGLYCERIN (NITROSTAT) SL tablet 0.4 mg  0.4 mg Sublingual Q5 Min x 3 PRN Nada Boozer, NP      . nitroGLYCERIN (NTG ON-CALL) 0.2 mg/mL injection           . ondansetron (ZOFRAN) injection 4 mg  4 mg Intravenous Q6H PRN Nada Boozer, NP      . pantoprazole (PROTONIX)  EC tablet 40 mg  40 mg Oral Daily Nada Boozer, NP   40 mg at 12/20/11 1057  . polyethylene glycol (MIRALAX / GLYCOLAX) packet 17 g  17 g Oral Daily Nada Boozer, NP   17 g at 12/18/11 0947  . Rivaroxaban (XARELTO) tablet TABS 15 mg  15 mg Oral Daily [name], MD   15 mg at 12/20/11 2011  . simvastatin (ZOCOR) tablet 10 mg  10 mg Oral q1800 Nada Boozer, NP   10 mg at 12/20/11 2159  . zolpidem (AMBIEN) tablet 5 mg  5 mg Oral QHS Nada Boozer, NP   5 mg at 12/20/11 2200  . DISCONTD: 0.9 %  sodium chloride infusion   Intravenous Continuous Nada Boozer, NP 10 mL/hr at 12/19/11 0600    . DISCONTD: 0.9 %  sodium chloride infusion  250 mL Intravenous PRN Nada Boozer, NP      . DISCONTD: 0.9 %  sodium chloride infusion  1 mL/kg/hr Intravenous Continuous Nada Boozer, NP 56 mL/hr at 12/20/11  0345 1 mL/kg/hr at 12/20/11 0345  . DISCONTD: acetaminophen (TYLENOL) tablet 650 mg  650 mg Oral Q4H PRN [name], MD      . DISCONTD: aspirin EC tablet 81 mg  81 mg Oral Daily Nada Boozer, NP   81 mg at 12/20/11 1056  . DISCONTD: diazepam (VALIUM) tablet 5 mg  5 mg Oral On Call Nada Boozer, NP   5 mg at 12/20/11 1400  . DISCONTD: heparin ADULT infusion 100 units/mL (25000 units/250 mL)  750 Units/hr Intravenous Continuous Benny Lennert, PHARMD 7.5 mL/hr at 12/20/11 0345 750 Units/hr at 12/20/11 0345  . DISCONTD: heparin ADULT infusion 100 units/mL (25000 units/250 mL)  850 Units/hr Intravenous Continuous Severiano Gilbert, PHARMD 8.5 mL/hr at 12/20/11 0930 850 Units/hr at 12/20/11 0930  . DISCONTD: nitroGLYCERIN 0.2 mg/mL in dextrose 5 % infusion  5 mcg/min Intravenous Titrated Nada Boozer, NP   5 mcg/min at 12/19/11 0600  . DISCONTD: ondansetron (ZOFRAN) injection 4 mg  4 mg Intravenous Q6H PRN [name], MD      . DISCONTD: sodium chloride 0.9 % injection 3 mL  3 mL Intravenous Q12H Nada Boozer, NP   3 mL at 12/20/11 1000  . DISCONTD: sodium chloride 0.9 % injection 3 mL  3 mL  Intravenous PRN Nada Boozer, NP        Physical Exam: General appearance: alert and no distress Neck: no adenopathy, no carotid bruit, no JVD, supple, symmetrical, trachea midline and thyroid not enlarged, symmetric, no tenderness/mass/nodules Lungs: clear to auscultation bilaterally Heart: regular rate and rhythm, S1, S2 normal, no murmur, click, rub or gallop Abdomen: soft, non-tender; bowel sounds normal; no masses,  no organomegaly Extremities: groin without hematoma, bruit, or mass Pulses: 2+ and symmetric Skin: Skin color, texture, turgor normal. No rashes or lesions Neurologic: Grossly normal  Lab Results: Results for orders placed during the hospital encounter of 12/17/11 (from the past 48 hour(s))  HEPARIN LEVEL (UNFRACTIONATED)     Status: Abnormal   Collection Time   12/19/11  3:40 PM      Component Value Range Comment   Heparin Unfractionated 0.11 (*) 0.30 - 0.70 IU/mL   APTT     Status: Abnormal   Collection Time   12/19/11  3:40 PM      Component Value Range Comment   aPTT 58 (*) 24 - 37 seconds   CBC     Status: Normal   Collection Time   12/20/11  5:50 AM      Component Value Range Comment   WBC 7.5  4.0 - 10.5 K/uL    RBC 4.46  3.87 - 5.11 MIL/uL    Hemoglobin 13.4  12.0 - 15.0 g/dL    HCT 45.4  09.8 - 11.9 %    MCV 89.5  78.0 - 100.0 fL    MCH 30.0  26.0 - 34.0 pg    MCHC 33.6  30.0 - 36.0 g/dL    RDW 14.7  82.9 - 56.2 %    Platelets 257  150 - 400 K/uL   BASIC METABOLIC PANEL     Status: Abnormal   Collection Time   12/20/11  5:50 AM      Component Value Range Comment   Sodium 139  135 - 145 mEq/L    Potassium 4.0  3.5 - 5.1 mEq/L    Chloride 104  96 - 112 mEq/L    CO2 29  19 - 32 mEq/L    Glucose, Bld  93  70 - 99 mg/dL    BUN 16  6 - 23 mg/dL    Creatinine, Ser 1.61  0.50 - 1.10 mg/dL    Calcium 09.6  8.4 - 10.5 mg/dL    GFR calc non Af Amer 75 (*) >90 mL/min    GFR calc Af Amer 87 (*) >90 mL/min   PROTIME-INR     Status: Normal   Collection Time    12/20/11  5:50 AM      Component Value Range Comment   Prothrombin Time 14.1  11.6 - 15.2 seconds    INR 1.07  0.00 - 1.49   HEPARIN LEVEL (UNFRACTIONATED)     Status: Abnormal   Collection Time   12/20/11  6:47 AM      Component Value Range Comment   Heparin Unfractionated 0.28 (*) 0.30 - 0.70 IU/mL   POCT ACTIVATED CLOTTING TIME     Status: Normal   Collection Time   12/20/11  3:48 PM      Component Value Range Comment   Activated Clotting Time 150     CBC     Status: Normal   Collection Time   12/21/11  4:10 AM      Component Value Range Comment   WBC 6.1  4.0 - 10.5 K/uL    RBC 4.33  3.87 - 5.11 MIL/uL    Hemoglobin 12.9  12.0 - 15.0 g/dL    HCT 04.5  40.9 - 81.1 %    MCV 88.7  78.0 - 100.0 fL    MCH 29.8  26.0 - 34.0 pg    MCHC 33.6  30.0 - 36.0 g/dL    RDW 91.4  78.2 - 95.6 %    Platelets 258  150 - 400 K/uL     Imaging: No results found.  Assessment:  1. Principal Problem: 2.  *Chest pain at rest 3. Active Problems: 4.  SOB (shortness of breath) 5.  CAD, CABG 1998, cath March 2012- medical Rx 6.  CVA (cerebral vascular accident) 7.  Chronic atrial fibrillation - AA thrombus 8.  Anticoagulated, Xarelto 9.  Splenic infarct, embolic, June 2012 10.  Bradycardia in the past 11.  COPD on CXR 12.   Plan:  1. Chest pain is likely due to small vessel ischemia. Continued medical therapy is recommended. Xarelto has been restarted given history of stroke and persistent LAA thrombus on TEE. Add low dose imdur 15 mg daily on discharge.   4 sets of cardiac enzymes are negative. Ok for discharge home today. Follow-up with Dr. Allyson Sabal.  Time Spent Directly with Patient:  15 minutes  Length of Stay:  LOS: 4 days   Chrystie Nose, MD, [facility] Attending Cardiologist [facility]  Gilman Olazabal C 12/21/2011, 8:11 AM

## 2012-01-24 ENCOUNTER — Ambulatory Visit (HOSPITAL_COMMUNITY)
Admission: RE | Admit: 2012-01-24 | Discharge: 2012-01-24 | Disposition: A | Discharge status: Home / Self Care | Payer: Medicare Other | Source: Ambulatory Visit | Attending: Family Medicine | Admitting: Family Medicine

## 2012-01-24 ENCOUNTER — Other Ambulatory Visit (HOSPITAL_COMMUNITY): Payer: Self-pay | Admitting: Family Medicine

## 2012-01-24 DIAGNOSIS — M1610 Unilateral primary osteoarthritis, unspecified hip: Secondary | ICD-10-CM | POA: Insufficient documentation

## 2012-01-24 DIAGNOSIS — M169 Osteoarthritis of hip, unspecified: Secondary | ICD-10-CM

## 2012-07-30 ENCOUNTER — Encounter (HOSPITAL_COMMUNITY): Payer: Self-pay | Admitting: Emergency Medicine

## 2012-07-30 ENCOUNTER — Emergency Department (HOSPITAL_COMMUNITY)
Admission: EM | Admit: 2012-07-30 | Discharge: 2012-07-31 | Disposition: A | Discharge status: Home / Self Care | Payer: Medicare Other | Attending: Emergency Medicine | Admitting: Emergency Medicine

## 2012-07-30 DIAGNOSIS — Z7901 Long term (current) use of anticoagulants: Secondary | ICD-10-CM | POA: Insufficient documentation

## 2012-07-30 DIAGNOSIS — R1013 Epigastric pain: Secondary | ICD-10-CM | POA: Insufficient documentation

## 2012-07-30 DIAGNOSIS — Z9089 Acquired absence of other organs: Secondary | ICD-10-CM | POA: Insufficient documentation

## 2012-07-30 DIAGNOSIS — E860 Dehydration: Secondary | ICD-10-CM

## 2012-07-30 DIAGNOSIS — Z7982 Long term (current) use of aspirin: Secondary | ICD-10-CM | POA: Insufficient documentation

## 2012-07-30 DIAGNOSIS — J4489 Other specified chronic obstructive pulmonary disease: Secondary | ICD-10-CM | POA: Insufficient documentation

## 2012-07-30 DIAGNOSIS — Z8679 Personal history of other diseases of the circulatory system: Secondary | ICD-10-CM | POA: Insufficient documentation

## 2012-07-30 DIAGNOSIS — R197 Diarrhea, unspecified: Secondary | ICD-10-CM | POA: Insufficient documentation

## 2012-07-30 DIAGNOSIS — I4891 Unspecified atrial fibrillation: Secondary | ICD-10-CM | POA: Insufficient documentation

## 2012-07-30 DIAGNOSIS — J449 Chronic obstructive pulmonary disease, unspecified: Secondary | ICD-10-CM | POA: Insufficient documentation

## 2012-07-30 DIAGNOSIS — Z8673 Personal history of transient ischemic attack (TIA), and cerebral infarction without residual deficits: Secondary | ICD-10-CM | POA: Insufficient documentation

## 2012-07-30 DIAGNOSIS — I10 Essential (primary) hypertension: Secondary | ICD-10-CM | POA: Insufficient documentation

## 2012-07-30 DIAGNOSIS — R011 Cardiac murmur, unspecified: Secondary | ICD-10-CM | POA: Insufficient documentation

## 2012-07-30 DIAGNOSIS — K219 Gastro-esophageal reflux disease without esophagitis: Secondary | ICD-10-CM | POA: Insufficient documentation

## 2012-07-30 DIAGNOSIS — Z8709 Personal history of other diseases of the respiratory system: Secondary | ICD-10-CM | POA: Insufficient documentation

## 2012-07-30 DIAGNOSIS — Z8719 Personal history of other diseases of the digestive system: Secondary | ICD-10-CM | POA: Insufficient documentation

## 2012-07-30 DIAGNOSIS — Z862 Personal history of diseases of the blood and blood-forming organs and certain disorders involving the immune mechanism: Secondary | ICD-10-CM | POA: Insufficient documentation

## 2012-07-30 DIAGNOSIS — Z8639 Personal history of other endocrine, nutritional and metabolic disease: Secondary | ICD-10-CM | POA: Insufficient documentation

## 2012-07-30 DIAGNOSIS — Z90710 Acquired absence of both cervix and uterus: Secondary | ICD-10-CM | POA: Insufficient documentation

## 2012-07-30 DIAGNOSIS — Z9861 Coronary angioplasty status: Secondary | ICD-10-CM | POA: Insufficient documentation

## 2012-07-30 DIAGNOSIS — Z79899 Other long term (current) drug therapy: Secondary | ICD-10-CM | POA: Insufficient documentation

## 2012-07-30 DIAGNOSIS — Z9889 Other specified postprocedural states: Secondary | ICD-10-CM | POA: Insufficient documentation

## 2012-07-30 DIAGNOSIS — M129 Arthropathy, unspecified: Secondary | ICD-10-CM | POA: Insufficient documentation

## 2012-07-30 DIAGNOSIS — R112 Nausea with vomiting, unspecified: Secondary | ICD-10-CM | POA: Insufficient documentation

## 2012-07-30 DIAGNOSIS — E782 Mixed hyperlipidemia: Secondary | ICD-10-CM | POA: Insufficient documentation

## 2012-07-30 DIAGNOSIS — I2510 Atherosclerotic heart disease of native coronary artery without angina pectoris: Secondary | ICD-10-CM | POA: Insufficient documentation

## 2012-07-30 LAB — COMPREHENSIVE METABOLIC PANEL
AST: 25 U/L (ref 0–37)
Albumin: 4.3 g/dL (ref 3.5–5.2)
Calcium: 10.1 mg/dL (ref 8.4–10.5)
Chloride: 96 mEq/L (ref 96–112)
Creatinine, Ser: 0.64 mg/dL (ref 0.50–1.10)
Total Bilirubin: 1 mg/dL (ref 0.3–1.2)
Total Protein: 8 g/dL (ref 6.0–8.3)

## 2012-07-30 LAB — CBC WITH DIFFERENTIAL/PLATELET
Basophils Absolute: 0 10*3/uL (ref 0.0–0.1)
Basophils Relative: 0 % (ref 0–1)
HCT: 43.7 % (ref 36.0–46.0)
MCHC: 35 g/dL (ref 30.0–36.0)
Monocytes Absolute: 0.3 10*3/uL (ref 0.1–1.0)
Neutro Abs: 10 10*3/uL — ABNORMAL HIGH (ref 1.7–7.7)
RDW: 13 % (ref 11.5–15.5)

## 2012-07-30 LAB — LIPASE, BLOOD: Lipase: 27 U/L (ref 11–59)

## 2012-07-30 MED ORDER — ONDANSETRON HCL 4 MG PO TABS: 4.0000 mg | ORAL_TABLET | Freq: Four times a day (QID) | ORAL | Status: DC

## 2012-07-30 MED ORDER — SODIUM CHLORIDE 0.9 % IV SOLN
Freq: Once | INTRAVENOUS | Status: AC
  Administered 2012-07-30: 23:00:00 via INTRAVENOUS

## 2012-07-30 MED ORDER — ONDANSETRON HCL 4 MG/2ML IJ SOLN
4.0000 mg | Freq: Once | INTRAMUSCULAR | Status: AC
  Administered 2012-07-30: 4 mg via INTRAVENOUS
  Filled 2012-07-30: qty 2

## 2012-07-30 MED ORDER — FAMOTIDINE IN NACL 20-0.9 MG/50ML-% IV SOLN
20.0000 mg | Freq: Once | INTRAVENOUS | Status: AC
  Administered 2012-07-30: 20 mg via INTRAVENOUS
  Filled 2012-07-30: qty 50

## 2012-07-30 NOTE — ED Provider Notes (Signed)
History     CSN: [id number]  Arrival date & time 07/30/12  2137   First MD Initiated Contact with Patient 07/30/12 2309      Nausea and vomiting  (Consider location/radiation/quality/duration/timing/severity/associated sxs/prior treatment) HPI Very pleasant 79 yo woman with multiple chronic medical problems who presents with approx 11 hours of nausea and vomiting. Sx began around noon. Patient has had an estimated 10-12 episodes of nonbloody nonbilious emesis. She has been unable to keep down fluids. She had a normal BM this afternoon. No diarrhea, melena, hematochezia.   Denies fever. Has mild to moderate, cramping midline epigastric pain with episodes of N/V. Currently, disocomfort is 2/10. 5/10 at worst and nonradiating. No recent med changes. Has not taken an antiemetic. No known ill contact.   Patient is s/p cholecystectomy & hysterectomy, remotely. She denies GU sx. Reports normal UOP.   Past Medical History  Diagnosis Date  . Hx of cholecystectomy t  . S/P hysterectomy t  . S/P lumbar fusion t  . Hypertension   . Atrial fib/flutter, transient     2D Echo 07/06/2010  EF of 60 to 65%   . SOB (shortness of breath) 03/14/2011  . CAD (coronary artery disease) 03/14/2011  . PAF (paroxysmal atrial fibrillation) 03/14/2011  . Anticoagulated 03/14/2011  . Infarction splenic 03/14/2011  . Bradycardia 03/14/2011  . GERD (gastroesophageal reflux disease)   . Hiatal hernia   . Heart murmur   . Arthritis   . Thyroid disease   . CVA (cerebral vascular accident)     02/28/2010  . Bleeding 12/12    rectal  . COPD (chronic obstructive pulmonary disease)     on xray  . Hyperlipidemia, mixed   . Carotid artery disease     Carotid duplex doppler 06/04/11    Past Surgical History  Procedure Laterality Date  . Coronary artery bypass graft      CABG x 4  05/28/96  Dr Donata Clay  (left internal mammary artery to the left anterior descending saphenous vein graft to the diagonal, saphenous  vein graft to obtuse marginal, saphenous vein graft to posterior descending  . Bladder suspension    . Cataract extraction w/ intraocular lens  implant, bilateral    . Tee without cardioversion  03/16/2011    Procedure: TRANSESOPHAGEAL ECHOCARDIOGRAM (TEE);  Surgeon: Chrystie Nose;  Location: [facility] ENDOSCOPY;  Service: Cardiovascular;  Laterality: N/A;  . Colonoscopy  03/17/2011    Procedure: COLONOSCOPY;  Surgeon: Barrie Folk, MD;  Location: [facility] ENDOSCOPY;  Service: Endoscopy;  Laterality: N/A;  . Cholecystectomy    . Back surgery    . Abdominal hysterectomy  1974  . Eye surgery      both cataracts  . Cardiac catheterization      05/24/1996/Dr Riley Kill  revealed 3 vessel coronary artery disease,90-95% LAD, 70-80% mid LAD, 70% diagonal, 80% circumflex, 60-7-% circumflex lesions, 60% proximal RCA as well as 50-70, 60% distal RCA, 90% proximal  PDA with normal LV function;  03/10/1999, 12/04/1998, 05/01/2002, 06/18/2003, 08/08/2008, 12/20/11, 07/21/10     Family History  Problem Relation Age of Onset  . Cancer Mother     colon  . Cancer Son     colon    History  Substance Use Topics  . Smoking status: Never Smoker   . Smokeless tobacco: Never Used  . Alcohol Use: No    OB History   Grav Para Term Preterm Abortions TAB SAB Ect Mult Living  Review of Systems Gen: no weight loss, fevers, chills, night sweats Eyes: no discharge or drainage, no occular pain or visual changes Nose: no epistaxis or rhinorrhea Mouth: no dental pain, no sore throat Neck: no neck pain Lungs: no SOB, cough, wheezing CV: no chest pain, palpitations, dependent edema or orthopnea Abd: As per history of present illness, otherwise negative GU: no dysuria or gross hematuria MSK: no myalgias or arthralgias Neuro: no headache, no focal neurologic deficits Skin: no rash Psyche: negative.  Allergies  Celebrex; Ciprofloxacin hcl; Codeine; Doxycycline; Famotidine; Levofloxacin; Macrolides and  ketolides; Penicillins; Plavix; Warfarin sodium; and Amlodipine  Home Medications   Current Outpatient Rx  Name  Route  Sig  Dispense  Refill  . acetaminophen (TYLENOL) 325 MG tablet   Oral   Take 650 mg by mouth every 6 (six) hours as needed. For pain or fever          . aspirin 81 MG tablet   Oral   Take 81 mg by mouth daily.           . cholecalciferol (VITAMIN D) 1000 UNITS tablet   Oral   Take 2,000 Units by mouth daily.           Marland Kitchen diltiazem (CARDIZEM CD) 180 MG 24 hr capsule   Oral   Take 180 mg by mouth daily.         Marland Kitchen ezetimibe (ZETIA) 10 MG tablet   Oral   Take 10 mg by mouth daily.           . fluvastatin XL (LESCOL XL) 80 MG 24 hr tablet   Oral   Take 80 mg by mouth daily.           . furosemide (LASIX) 40 MG tablet   Oral   Take 40 mg by mouth daily. Alternates taking 20mg /40mg  daily         . isosorbide mononitrate (IMDUR) 60 MG 24 hr tablet   Oral   Take 1 tablet (60 mg total) by mouth daily.         . meloxicam (MOBIC) 7.5 MG tablet   Oral   Take 7.5 mg by mouth 2 (two) times daily as needed. For pain         . metoprolol (TOPROL-XL) 50 MG 24 hr tablet   Oral   Take 50 mg by mouth daily.          . Multiple Vitamin (MULITIVITAMIN WITH MINERALS) TABS   Oral   Take 1 tablet by mouth daily.         . Omega-3 Fatty Acids (FISH OIL) 1200 MG CAPS   Oral   Take 1,200 mg by mouth daily.         . pantoprazole (PROTONIX) 40 MG tablet   Oral   Take 40 mg by mouth daily.           . polyethylene glycol (MIRALAX / GLYCOLAX) packet   Oral   Take 17 g by mouth daily.           . ranolazine (RANEXA) 500 MG 12 hr tablet   Oral   Take 1 tablet (500 mg total) by mouth 2 (two) times daily.   60 tablet   5   . Rivaroxaban (XARELTO) 15 MG TABS tablet   Oral   Take 15 mg by mouth daily.          Marland Kitchen zolpidem (AMBIEN) 10 MG tablet   Oral   Take  10 mg by mouth at bedtime.           . nitroGLYCERIN (NITROSTAT) 0.4 MG SL  tablet   Sublingual   Place 1 tablet (0.4 mg total) under the tongue every 5 (five) minutes x 3 doses as needed for chest pain.   25 tablet   3     BP 134/86  Pulse 97  Temp(Src) 98.3 F (36.8 C) (Oral)  Resp 14  SpO2 98%  Physical Exam Gen: well developed and well nourished appearing Head: NCAT Eyes: PERL, EOMI Nose: no epistaixis or rhinorrhea Mouth/throat: mucosa is mildly dehydrated appearing and pink Neck: supple, no stridor Lungs: CTA B, no wheezing, rhonchi or rales Heart: Irregular  rhythm, rate in the 80s, 2/6 systolic murmur appreciated, extremities appear well perfused Abd: soft, notender, nondistended Back: no ttp, no cva ttp Skin: no rashese, wnl Neuro: CN ii-xii grossly intact, no focal deficits EXT: no clubbing, cyanosis, no edema Psyche; normal affect,  calm and cooperative.   ED Course  Procedures (including critical care time)  Results for orders placed during the hospital encounter of 07/30/12 (from the past 24 hour(s))  CBC WITH DIFFERENTIAL     Status: Abnormal   Collection Time    07/30/12 10:22 PM      Result Value Range   WBC 10.8 (*) 4.0 - 10.5 K/uL   RBC 5.39 (*) 3.87 - 5.11 MIL/uL   Hemoglobin 15.3 (*) 12.0 - 15.0 g/dL   HCT 91.4  78.2 - 95.6 %   MCV 81.1  78.0 - 100.0 fL   MCH 28.4  26.0 - 34.0 pg   MCHC 35.0  30.0 - 36.0 g/dL   RDW 21.3  08.6 - 57.8 %   Platelets 259  150 - 400 K/uL   Neutrophils Relative 93 (*) 43 - 77 %   Neutro Abs 10.0 (*) 1.7 - 7.7 K/uL   Lymphocytes Relative 5 (*) 12 - 46 %   Lymphs Abs 0.5 (*) 0.7 - 4.0 K/uL   Monocytes Relative 3  3 - 12 %   Monocytes Absolute 0.3  0.1 - 1.0 K/uL   Eosinophils Relative 0  0 - 5 %   Eosinophils Absolute 0.0  0.0 - 0.7 K/uL   Basophils Relative 0  0 - 1 %   Basophils Absolute 0.0  0.0 - 0.1 K/uL  COMPREHENSIVE METABOLIC PANEL     Status: Abnormal   Collection Time    07/30/12 10:22 PM      Result Value Range   Sodium 135  135 - 145 mEq/L   Potassium 3.4 (*) 3.5 - 5.1  mEq/L   Chloride 96  96 - 112 mEq/L   CO2 23  19 - 32 mEq/L   Glucose, Bld 147 (*) 70 - 99 mg/dL   BUN 16  6 - 23 mg/dL   Creatinine, Ser 4.69  0.50 - 1.10 mg/dL   Calcium 62.9  8.4 - 52.8 mg/dL   Total Protein 8.0  6.0 - 8.3 g/dL   Albumin 4.3  3.5 - 5.2 g/dL   AST 25  0 - 37 U/L   ALT 20  0 - 35 U/L   Alkaline Phosphatase 95  39 - 117 U/L   Total Bilirubin 1.0  0.3 - 1.2 mg/dL   GFR calc non Af Amer 77 (*) >90 mL/min   GFR calc Af Amer 90 (*) >90 mL/min   EKG: AF with rate of 101 bpm, left posterior fascicular block, LAD,  no acute ischemic changes.   MDM  Patient with 12 hrs of nausea and vomiting with chronic AF and multiple comorbidities. Abdominal exam is reassuring and VS are wnl. WBC is mildly elevated. CMP, lipase and trop wnl. EKG shows chronic AF with rate control. We are treating symptomatically with zofran, ivf and pepcid. Awaiting results of U/A. Will po challenge and re-evaluate for discharge home and plan for close outpatient follow up.         Brandt Loosen, MD 07/31/12 [phone]

## 2012-07-30 NOTE — ED Notes (Signed)
PT. REPORTS EMESIS WITH DIARRHEA AND MID ABDOMINAL CRAMPING ONSET THIS MORNING .

## 2012-07-31 LAB — URINALYSIS, ROUTINE W REFLEX MICROSCOPIC
Nitrite: NEGATIVE
Specific Gravity, Urine: 1.012 (ref 1.005–1.030)
pH: 7.5 (ref 5.0–8.0)

## 2012-07-31 LAB — URINE MICROSCOPIC-ADD ON

## 2012-07-31 NOTE — ED Notes (Signed)
Pt given ice chips

## 2012-07-31 NOTE — ED Notes (Signed)
PY STATED THAT SHE HAD USED THE RESTROOM JUST BEFORE COMING BACK TO HER ROOM. PT STATED THAT SHE CAN'T USE THE RESTROOM AT THIS TIME BUT WILL CALL ONCE SHE FEELS SHE CAN USE THE RESTROOM.

## 2012-08-25 ENCOUNTER — Ambulatory Visit (HOSPITAL_COMMUNITY)
Admission: RE | Admit: 2012-08-25 | Discharge: 2012-08-25 | Disposition: A | Discharge status: Home / Self Care | Payer: Medicare Other | Source: Ambulatory Visit | Attending: Family Medicine | Admitting: Family Medicine

## 2012-08-25 ENCOUNTER — Other Ambulatory Visit (HOSPITAL_COMMUNITY): Payer: Self-pay | Admitting: Family Medicine

## 2012-08-25 ENCOUNTER — Encounter: Payer: Self-pay | Admitting: Cardiovascular Disease

## 2012-08-25 DIAGNOSIS — M25559 Pain in unspecified hip: Secondary | ICD-10-CM | POA: Insufficient documentation

## 2012-08-25 DIAGNOSIS — M25551 Pain in right hip: Secondary | ICD-10-CM

## 2012-08-28 ENCOUNTER — Emergency Department (HOSPITAL_COMMUNITY)
Admission: EM | Admit: 2012-08-28 | Discharge: 2012-08-28 | Disposition: A | Discharge status: Home / Self Care | Payer: Medicare Other | Attending: Emergency Medicine | Admitting: Emergency Medicine

## 2012-08-28 ENCOUNTER — Emergency Department (HOSPITAL_COMMUNITY): Payer: Medicare Other

## 2012-08-28 ENCOUNTER — Encounter (HOSPITAL_COMMUNITY): Payer: Self-pay | Admitting: Emergency Medicine

## 2012-08-28 DIAGNOSIS — Z8719 Personal history of other diseases of the digestive system: Secondary | ICD-10-CM | POA: Insufficient documentation

## 2012-08-28 DIAGNOSIS — M545 Low back pain, unspecified: Secondary | ICD-10-CM | POA: Insufficient documentation

## 2012-08-28 DIAGNOSIS — J4489 Other specified chronic obstructive pulmonary disease: Secondary | ICD-10-CM | POA: Insufficient documentation

## 2012-08-28 DIAGNOSIS — Z90710 Acquired absence of both cervix and uterus: Secondary | ICD-10-CM | POA: Insufficient documentation

## 2012-08-28 DIAGNOSIS — Z862 Personal history of diseases of the blood and blood-forming organs and certain disorders involving the immune mechanism: Secondary | ICD-10-CM | POA: Insufficient documentation

## 2012-08-28 DIAGNOSIS — M25559 Pain in unspecified hip: Secondary | ICD-10-CM | POA: Insufficient documentation

## 2012-08-28 DIAGNOSIS — R209 Unspecified disturbances of skin sensation: Secondary | ICD-10-CM | POA: Insufficient documentation

## 2012-08-28 DIAGNOSIS — I2510 Atherosclerotic heart disease of native coronary artery without angina pectoris: Secondary | ICD-10-CM | POA: Insufficient documentation

## 2012-08-28 DIAGNOSIS — Z8639 Personal history of other endocrine, nutritional and metabolic disease: Secondary | ICD-10-CM | POA: Insufficient documentation

## 2012-08-28 DIAGNOSIS — Z8673 Personal history of transient ischemic attack (TIA), and cerebral infarction without residual deficits: Secondary | ICD-10-CM | POA: Insufficient documentation

## 2012-08-28 DIAGNOSIS — J449 Chronic obstructive pulmonary disease, unspecified: Secondary | ICD-10-CM | POA: Insufficient documentation

## 2012-08-28 DIAGNOSIS — M549 Dorsalgia, unspecified: Secondary | ICD-10-CM

## 2012-08-28 DIAGNOSIS — M129 Arthropathy, unspecified: Secondary | ICD-10-CM | POA: Insufficient documentation

## 2012-08-28 DIAGNOSIS — I10 Essential (primary) hypertension: Secondary | ICD-10-CM | POA: Insufficient documentation

## 2012-08-28 DIAGNOSIS — Z79899 Other long term (current) drug therapy: Secondary | ICD-10-CM | POA: Insufficient documentation

## 2012-08-28 DIAGNOSIS — Z951 Presence of aortocoronary bypass graft: Secondary | ICD-10-CM | POA: Insufficient documentation

## 2012-08-28 DIAGNOSIS — R109 Unspecified abdominal pain: Secondary | ICD-10-CM | POA: Insufficient documentation

## 2012-08-28 DIAGNOSIS — R011 Cardiac murmur, unspecified: Secondary | ICD-10-CM | POA: Insufficient documentation

## 2012-08-28 DIAGNOSIS — Z9889 Other specified postprocedural states: Secondary | ICD-10-CM | POA: Insufficient documentation

## 2012-08-28 DIAGNOSIS — K219 Gastro-esophageal reflux disease without esophagitis: Secondary | ICD-10-CM | POA: Insufficient documentation

## 2012-08-28 DIAGNOSIS — E782 Mixed hyperlipidemia: Secondary | ICD-10-CM | POA: Insufficient documentation

## 2012-08-28 DIAGNOSIS — Z8679 Personal history of other diseases of the circulatory system: Secondary | ICD-10-CM | POA: Insufficient documentation

## 2012-08-28 DIAGNOSIS — Z7982 Long term (current) use of aspirin: Secondary | ICD-10-CM | POA: Insufficient documentation

## 2012-08-28 DIAGNOSIS — Z880 Allergy status to penicillin: Secondary | ICD-10-CM | POA: Insufficient documentation

## 2012-08-28 MED ORDER — HYDROMORPHONE HCL 2 MG PO TABS: 2.0000 mg | ORAL_TABLET | ORAL | Status: DC | PRN

## 2012-08-28 MED ORDER — HYDROMORPHONE HCL 2 MG PO TABS
2.0000 mg | ORAL_TABLET | Freq: Once | ORAL | Status: AC
  Administered 2012-08-28: 2 mg via ORAL
  Filled 2012-08-28: qty 1

## 2012-08-28 NOTE — ED Notes (Signed)
Pt c/o r hip pain with radiation down r leg. Seen pcp Friday, received rx but had itching reaction to it so stopped taking it. Last does of rx med was Saturday morning. Pt back today due to more intense pain rating 10. Alert/oriented. Moving slow due to pain. Needed very little help from w/c to stretcher. Has not taken morning meds this am which one was bp med.

## 2012-08-28 NOTE — ED Provider Notes (Signed)
History     This chart was scribed for Shelda Jakes, MD, MD by Smitty Pluck, ED Scribe. The patient was seen in room APA18/[facility]18 and the patient's care was started at 9:15 PM.   CSN: [id number]  Arrival date & time 08/28/12  4098      Chief Complaint  Patient presents with  . Sciatica     Patient is a 79 y.o. female presenting with hip pain. The history is provided by the patient, medical records and a relative. No language interpreter was used.  Hip Pain This is a chronic problem. The current episode started more than 2 days ago. The problem occurs constantly. The problem has been gradually worsening. Associated symptoms include abdominal pain. Pertinent negatives include no shortness of breath. The symptoms are aggravated by standing. Nothing relieves the symptoms. She has tried acetaminophen for the symptoms.   HPI Comments: Cathy Ortega is a 79 y.o. female who presents to the Emergency Department complaining of constant, moderate lower back pain radiating to right leg that has been ongoing for years but worsening 1 week ago. She reports that she has received an xray of right hip last year showing that she has hip joint degeneration. Pt was seen by Dr. Phillips Odor (PCP) for same symptoms and was given pain medication (tylenol 3) along with injection and had an additional hip xray 3 days ago with nl findings. Pt reports that she stopped taking the pain medication 2 days ago because she was having allergic reaction. Pt reports that she takes Meloxicam for chronic back pain. Pain is aggravated by bearing weight. She reports having intermittent, numbness in right great toe that has been ongoing for past 2 months. She states she has abdominal pain and arthralgia. Pt denies recent fall, recent trauma to hip, fever, chills, nausea, vomiting, diarrhea, weakness, cough, SOB and any other pain. She reports hx of back surgery. Pt reports that she takes xeralto. She denies taking coumadin.    Past  Medical History  Diagnosis Date  . Hx of cholecystectomy t  . S/P hysterectomy t  . S/P lumbar fusion t  . Hypertension   . Atrial fib/flutter, transient     2D Echo 07/06/2010  EF of 60 to 65%   . SOB (shortness of breath) 03/14/2011  . CAD (coronary artery disease) 03/14/2011  . PAF (paroxysmal atrial fibrillation) 03/14/2011  . Anticoagulated 03/14/2011  . Infarction splenic 03/14/2011  . Bradycardia 03/14/2011  . GERD (gastroesophageal reflux disease)   . Hiatal hernia   . Heart murmur   . Arthritis   . Thyroid disease   . CVA (cerebral vascular accident)     02/28/2010  . Bleeding 12/12    rectal  . COPD (chronic obstructive pulmonary disease)     on xray  . Hyperlipidemia, mixed   . Carotid artery disease     Carotid duplex doppler 06/04/11    Past Surgical History  Procedure Laterality Date  . Coronary artery bypass graft      CABG x 4  05/28/96  Dr Donata Clay  (left internal mammary artery to the left anterior descending saphenous vein graft to the diagonal, saphenous vein graft to obtuse marginal, saphenous vein graft to posterior descending  . Bladder suspension    . Cataract extraction w/ intraocular lens  implant, bilateral    . Tee without cardioversion  03/16/2011    Procedure: TRANSESOPHAGEAL ECHOCARDIOGRAM (TEE);  Surgeon: Chrystie Nose;  Location: [facility] ENDOSCOPY;  Service: Cardiovascular;  Laterality:  N/A;  . Colonoscopy  03/17/2011    Procedure: COLONOSCOPY;  Surgeon: Barrie Folk, MD;  Location: [facility] ENDOSCOPY;  Service: Endoscopy;  Laterality: N/A;  . Cholecystectomy    . Back surgery    . Abdominal hysterectomy  1974  . Eye surgery      both cataracts  . Cardiac catheterization      05/24/1996/Dr Riley Kill  revealed 3 vessel coronary artery disease,90-95% LAD, 70-80% mid LAD, 70% diagonal, 80% circumflex, 60-7-% circumflex lesions, 60% proximal RCA as well as 50-70, 60% distal RCA, 90% proximal  PDA with normal LV function;  03/10/1999, 12/04/1998, 05/01/2002,  06/18/2003, 08/08/2008, 12/20/11, 07/21/10     Family History  Problem Relation Age of Onset  . Cancer Mother     colon  . Cancer Son     colon    History  Substance Use Topics  . Smoking status: Never Smoker   . Smokeless tobacco: Never Used  . Alcohol Use: No    OB History   Grav Para Term Preterm Abortions TAB SAB Ect Mult Living                  Review of Systems  Constitutional: Negative for fever and chills.  HENT: Negative for sore throat and rhinorrhea.   Respiratory: Negative for cough and shortness of breath.   Cardiovascular: Negative for leg swelling.  Gastrointestinal: Positive for abdominal pain. Negative for nausea, vomiting and diarrhea.  Genitourinary: Negative for dysuria.  Musculoskeletal: Positive for back pain and arthralgias.  Skin: Negative for rash.  Neurological: Positive for numbness.  Hematological: Bruises/bleeds easily.  Psychiatric/Behavioral: Negative for confusion.    Allergies  Celebrex; Ciprofloxacin hcl; Codeine; Doxycycline; Famotidine; Levofloxacin; Macrolides and ketolides; Penicillins; Plavix; Warfarin sodium; and Amlodipine  Home Medications   Current Outpatient Rx  Name  Route  Sig  Dispense  Refill  . acetaminophen (TYLENOL) 325 MG tablet   Oral   Take 650 mg by mouth every 6 (six) hours as needed. For pain or fever          . aspirin 81 MG tablet   Oral   Take 81 mg by mouth daily.           Marland Kitchen Bioflavonoid Products (ESTER C PO)   Oral   Take 500 mg by mouth daily.         . cholecalciferol (VITAMIN D) 1000 UNITS tablet   Oral   Take 2,000 Units by mouth daily.           Marland Kitchen diltiazem (CARDIZEM CD) 180 MG 24 hr capsule   Oral   Take 180 mg by mouth daily.         Marland Kitchen ezetimibe (ZETIA) 10 MG tablet   Oral   Take 10 mg by mouth daily.           . fluvastatin XL (LESCOL XL) 80 MG 24 hr tablet   Oral   Take 80 mg by mouth daily.           . furosemide (LASIX) 40 MG tablet   Oral   Take 20-40 mg  by mouth daily. Alternates taking 20mg /40mg  daily         . hydroxypropyl methylcellulose (ISOPTO TEARS) 2.5 % ophthalmic solution   Both Eyes   Place 1 drop into both eyes at bedtime.         . isosorbide mononitrate (IMDUR) 120 MG 24 hr tablet   Oral   Take 120 mg by mouth  daily.         . meloxicam (MOBIC) 7.5 MG tablet   Oral   Take 7.5 mg by mouth 2 (two) times daily as needed for pain.          . metoprolol (TOPROL-XL) 50 MG 24 hr tablet   Oral   Take 50 mg by mouth daily.          . Multiple Vitamin (MULITIVITAMIN WITH MINERALS) TABS   Oral   Take 1 tablet by mouth daily.         . Omega-3 Fatty Acids (FISH OIL) 1200 MG CAPS   Oral   Take 1,200 mg by mouth daily.         . pantoprazole (PROTONIX) 40 MG tablet   Oral   Take 40 mg by mouth daily.           . polyethylene glycol (MIRALAX / GLYCOLAX) packet   Oral   Take 17 g by mouth daily.           . Rivaroxaban (XARELTO) 15 MG TABS tablet   Oral   Take 15 mg by mouth daily.          Marland Kitchen zolpidem (AMBIEN) 10 MG tablet   Oral   Take 10 mg by mouth at bedtime.           Marland Kitchen HYDROmorphone (DILAUDID) 2 MG tablet   Oral   Take 1 tablet (2 mg total) by mouth every 4 (four) hours as needed for pain.   30 tablet   0   . nitroGLYCERIN (NITROSTAT) 0.4 MG SL tablet   Sublingual   Place 1 tablet (0.4 mg total) under the tongue every 5 (five) minutes x 3 doses as needed for chest pain.   25 tablet   3     BP 169/85  Pulse 80  Temp(Src) 97.7 F (36.5 C) (Oral)  Resp 20  Ht 5\' 2"  (1.575 m)  Wt 114 lb (51.71 kg)  BMI 20.85 kg/m2  SpO2 96%  Physical Exam  Nursing note and vitals reviewed. Constitutional: She is oriented to person, place, and time. She appears well-developed and well-nourished. No distress.  HENT:  Head: Normocephalic and atraumatic.  Neck: Normal range of motion. Neck supple.  Cardiovascular: Normal rate and normal heart sounds.  An irregular rhythm present.  No murmur  heard. Pulmonary/Chest: Effort normal and breath sounds normal. No respiratory distress. She has no wheezes. She has no rales.  Abdominal: Soft. Bowel sounds are normal. She exhibits no distension. There is no tenderness. There is no rebound and no guarding.  Musculoskeletal: Normal range of motion.  Neurological: She is alert and oriented to person, place, and time. No cranial nerve deficit. Coordination normal.  Skin: Skin is warm and dry.  Psychiatric: She has a normal mood and affect. Her behavior is normal.    ED Course  Procedures (including critical care time) DIAGNOSTIC STUDIES: Oxygen Saturation is 96% on room air, adequate by my interpretation.    COORDINATION OF CARE: 9:33 AM Discussed ED treatment with pt and pt agrees.  9:45 AM Ordered:  Medications  HYDROmorphone (DILAUDID) tablet 2 mg (2 mg Oral Given 08/28/12 1012)       Labs Reviewed - No data to display Dg Hip Bilateral W/pelvis  08/28/2012   *RADIOLOGY REPORT*  Clinical Data: Bilateral hip pain.  Sciatica.  No known injury  BILATERAL HIP WITH PELVIS - 4+ VIEW  Comparison: CT abdomen pelvis 10/07/2010 and acute abdominal  series 10/07/2010  Findings: 2.2 cm coarse calcification projecting just cephalad to the left iliac bone corresponds to a calcified probable injection granuloma and the subcutaneous fat of the lower left back on seen prior CT.  There are postsurgical changes of the lower lumbar spine, partially visualized.  Both hips are located.  There are no significant degenerative changes or joint space narrowing of either hip, and there is no evidence of acute fracture.  The pelvic ring appears intact.  There are stable mild degenerative changes of the pubic symphysis.  IMPRESSION:  1.  No acute bony abnormality identified.  No significant degenerative change of either hip for patient age. 2.  Stable degenerative changes of the pubic symphysis.   Original Report Authenticated By: Britta Mccreedy, M.D.   Mr Lumbar Spine  Wo Contrast  08/28/2012   *RADIOLOGY REPORT*  Clinical Data: Chronic low back pain with increased pain radiating into the right leg for 1 week.  History of lumbar surgery.  MRI LUMBAR SPINE WITHOUT CONTRAST  Technique:  Multiplanar and multiecho pulse sequences of the lumbar spine were obtained without intravenous contrast.  Comparison: Abdominal pelvic CT 10/07/2010 lumbar MRI 11/04/2003.  Findings: Since the prior MRI, the patient has undergone L3-L5 PLIF.  Hardware appears unchanged from the more recent CT.  The alignment is stable with a mild convex right scoliosis.  There is no evidence of acute fracture.  Endplate degenerative changes have developed at L2-L3.  The conus medullaris extends to the L2 level and appears normal. There are no paraspinal abnormalities. Small hepatic and renal cysts are noted.  L1-L2:  No significant findings.  L2-L3:  Adjacent segment disease with annular disc bulging and a broad-based extraforaminal disc protrusion on the left are noted. There is associated endplate degeneration, also asymmetric to the left.  Mild facet and ligamentous hypertrophy is present bilaterally.  These factors contribute to mild central, left greater than right lateral recess and foraminal stenosis.  No right- sided nerve root encroachment is identified.  L3-L4:  No spinal stenosis status post PLIF.  L4-L5:  Mild right foraminal stenosis appears stable from the CT status post laminectomy and PLIF.  L5-S1: Disc degeneration with annular bulging and osteophytes. There is mild bilateral facet hypertrophy.  These factors contribute to mild to moderate chronic foraminal narrowing bilaterally which appears unchanged.  IMPRESSION:  1.  No explanation for the patient's increased right radicular symptoms identified. 2.  Progressive adjacent segment disease at L2-L3 with asymmetric disc bulging and osteophytes on the left.  There is resulting left greater than right lateral recess and foraminal stenosis.  No right-  sided nerve root encroachment identified. 3.  Prior L3-L5 PLIF without demonstrated complication. 4.  Chronic disc degeneration at L5-S1 contributing to chronic biforaminal stenosis.   Original Report Authenticated By: Carey Bullocks, M.D.     1. Back pain       MDM  Patient with long-standing the low back pain. Also with long-standing hip pain. X-rays of both hips today's without any evidence of fracture. Symptoms seem to be more consistent with back pain since starts in the buttocks area and goes down about halfway down the thigh on the right side. MRI without distinct abnormalities or any nerve root compression on the right side symptoms are on the right no symptoms on the left but the MRI looks worse on the left. Patient will followup with her primary care Dr. She did get some significant relief with oral hydromorphone here in the emergency department. This  may be a good pain medication for her to take at home. As stated no evidence of hip fracture no significant findings on the MRI of the lumbar area to explain today's current pain. Patient has no focal deficits distally in the leg. A      I personally performed the services described in this documentation, which was scribed in my presence. The recorded information has been reviewed and is accurate.     Shelda Jakes, MD 08/28/12 [phone]

## 2012-09-20 ENCOUNTER — Other Ambulatory Visit: Payer: Self-pay | Admitting: *Deleted

## 2012-09-20 MED ORDER — PANTOPRAZOLE SODIUM 40 MG PO TBEC: 40.0000 mg | DELAYED_RELEASE_TABLET | Freq: Every day | ORAL | Status: DC

## 2012-09-20 MED ORDER — ISOSORBIDE MONONITRATE ER 120 MG PO TB24: 120.0000 mg | ORAL_TABLET | Freq: Every day | ORAL | Status: DC

## 2012-09-22 ENCOUNTER — Other Ambulatory Visit: Payer: Self-pay | Admitting: *Deleted

## 2012-09-22 MED ORDER — ISOSORBIDE MONONITRATE ER 120 MG PO TB24: 120.0000 mg | ORAL_TABLET | Freq: Every day | ORAL | Status: DC

## 2012-09-22 MED ORDER — PANTOPRAZOLE SODIUM 40 MG PO TBEC: 40.0000 mg | DELAYED_RELEASE_TABLET | Freq: Every day | ORAL | Status: DC

## 2012-09-22 NOTE — Telephone Encounter (Signed)
Sent refill

## 2012-09-25 ENCOUNTER — Other Ambulatory Visit: Payer: Self-pay | Admitting: Gastroenterology

## 2012-09-27 ENCOUNTER — Other Ambulatory Visit (HOSPITAL_COMMUNITY): Payer: Self-pay | Admitting: Family Medicine

## 2012-09-27 DIAGNOSIS — Z139 Encounter for screening, unspecified: Secondary | ICD-10-CM

## 2012-10-02 ENCOUNTER — Other Ambulatory Visit: Payer: Self-pay | Admitting: *Deleted

## 2012-10-02 MED ORDER — PANTOPRAZOLE SODIUM 40 MG PO TBEC: 40.0000 mg | DELAYED_RELEASE_TABLET | Freq: Every day | ORAL | Status: AC

## 2012-10-02 MED ORDER — ISOSORBIDE MONONITRATE ER 120 MG PO TB24: 120.0000 mg | ORAL_TABLET | Freq: Every day | ORAL | Status: DC

## 2012-10-02 MED ORDER — ISOSORBIDE MONONITRATE ER 120 MG PO TB24: 120.0000 mg | ORAL_TABLET | Freq: Every day | ORAL | Status: AC

## 2012-10-02 MED ORDER — PANTOPRAZOLE SODIUM 40 MG PO TBEC: 40.0000 mg | DELAYED_RELEASE_TABLET | Freq: Every day | ORAL | Status: DC

## 2012-10-02 NOTE — Telephone Encounter (Signed)
Rxs printed.  Pharmacy updated and refills resent.  Pt/daughter aware.

## 2012-10-09 ENCOUNTER — Emergency Department (HOSPITAL_COMMUNITY): Payer: Medicare Other

## 2012-10-09 ENCOUNTER — Emergency Department (HOSPITAL_COMMUNITY)
Admission: EM | Admit: 2012-10-09 | Discharge: 2012-10-09 | Disposition: A | Discharge status: Home / Self Care | Payer: Medicare Other | Attending: Emergency Medicine | Admitting: Emergency Medicine

## 2012-10-09 ENCOUNTER — Encounter (HOSPITAL_COMMUNITY): Payer: Self-pay | Admitting: Emergency Medicine

## 2012-10-09 DIAGNOSIS — Z8639 Personal history of other endocrine, nutritional and metabolic disease: Secondary | ICD-10-CM | POA: Insufficient documentation

## 2012-10-09 DIAGNOSIS — Z880 Allergy status to penicillin: Secondary | ICD-10-CM | POA: Insufficient documentation

## 2012-10-09 DIAGNOSIS — I10 Essential (primary) hypertension: Secondary | ICD-10-CM | POA: Insufficient documentation

## 2012-10-09 DIAGNOSIS — Z862 Personal history of diseases of the blood and blood-forming organs and certain disorders involving the immune mechanism: Secondary | ICD-10-CM | POA: Insufficient documentation

## 2012-10-09 DIAGNOSIS — R011 Cardiac murmur, unspecified: Secondary | ICD-10-CM | POA: Insufficient documentation

## 2012-10-09 DIAGNOSIS — Z7982 Long term (current) use of aspirin: Secondary | ICD-10-CM | POA: Insufficient documentation

## 2012-10-09 DIAGNOSIS — Z90710 Acquired absence of both cervix and uterus: Secondary | ICD-10-CM | POA: Insufficient documentation

## 2012-10-09 DIAGNOSIS — Z8739 Personal history of other diseases of the musculoskeletal system and connective tissue: Secondary | ICD-10-CM | POA: Insufficient documentation

## 2012-10-09 DIAGNOSIS — J4489 Other specified chronic obstructive pulmonary disease: Secondary | ICD-10-CM | POA: Insufficient documentation

## 2012-10-09 DIAGNOSIS — E782 Mixed hyperlipidemia: Secondary | ICD-10-CM | POA: Insufficient documentation

## 2012-10-09 DIAGNOSIS — J449 Chronic obstructive pulmonary disease, unspecified: Secondary | ICD-10-CM | POA: Insufficient documentation

## 2012-10-09 DIAGNOSIS — Z9089 Acquired absence of other organs: Secondary | ICD-10-CM | POA: Insufficient documentation

## 2012-10-09 DIAGNOSIS — Z8673 Personal history of transient ischemic attack (TIA), and cerebral infarction without residual deficits: Secondary | ICD-10-CM | POA: Insufficient documentation

## 2012-10-09 DIAGNOSIS — I2510 Atherosclerotic heart disease of native coronary artery without angina pectoris: Secondary | ICD-10-CM | POA: Insufficient documentation

## 2012-10-09 DIAGNOSIS — R112 Nausea with vomiting, unspecified: Secondary | ICD-10-CM | POA: Insufficient documentation

## 2012-10-09 DIAGNOSIS — K5900 Constipation, unspecified: Secondary | ICD-10-CM | POA: Insufficient documentation

## 2012-10-09 DIAGNOSIS — K219 Gastro-esophageal reflux disease without esophagitis: Secondary | ICD-10-CM | POA: Insufficient documentation

## 2012-10-09 DIAGNOSIS — Z8719 Personal history of other diseases of the digestive system: Secondary | ICD-10-CM | POA: Insufficient documentation

## 2012-10-09 DIAGNOSIS — Z79899 Other long term (current) drug therapy: Secondary | ICD-10-CM | POA: Insufficient documentation

## 2012-10-09 DIAGNOSIS — R109 Unspecified abdominal pain: Secondary | ICD-10-CM | POA: Insufficient documentation

## 2012-10-09 DIAGNOSIS — Z951 Presence of aortocoronary bypass graft: Secondary | ICD-10-CM | POA: Insufficient documentation

## 2012-10-09 DIAGNOSIS — Z8679 Personal history of other diseases of the circulatory system: Secondary | ICD-10-CM | POA: Insufficient documentation

## 2012-10-09 LAB — COMPREHENSIVE METABOLIC PANEL
BUN: 12 mg/dL (ref 6–23)
CO2: 34 mEq/L — ABNORMAL HIGH (ref 19–32)
Calcium: 9.6 mg/dL (ref 8.4–10.5)
Creatinine, Ser: 0.71 mg/dL (ref 0.50–1.10)
GFR calc Af Amer: 87 mL/min — ABNORMAL LOW (ref >90)
GFR calc non Af Amer: 75 mL/min — ABNORMAL LOW (ref >90)
Glucose, Bld: 120 mg/dL — ABNORMAL HIGH (ref 70–99)

## 2012-10-09 LAB — URINALYSIS, ROUTINE W REFLEX MICROSCOPIC
Bilirubin Urine: NEGATIVE
Nitrite: NEGATIVE
Specific Gravity, Urine: 1.01 (ref 1.005–1.030)
Urobilinogen, UA: 1 mg/dL (ref 0.0–1.0)

## 2012-10-09 LAB — URINE MICROSCOPIC-ADD ON

## 2012-10-09 LAB — CBC WITH DIFFERENTIAL/PLATELET
Eosinophils Relative: 0 % (ref 0–5)
HCT: 39 % (ref 36.0–46.0)
Lymphocytes Relative: 11 % — ABNORMAL LOW (ref 12–46)
Lymphs Abs: 1.3 10*3/uL (ref 0.7–4.0)
MCH: 28.1 pg (ref 26.0–34.0)
MCV: 83.7 fL (ref 78.0–100.0)
Monocytes Absolute: 1.1 10*3/uL — ABNORMAL HIGH (ref 0.1–1.0)
RBC: 4.66 MIL/uL (ref 3.87–5.11)
WBC: 11.9 10*3/uL — ABNORMAL HIGH (ref 4.0–10.5)

## 2012-10-09 MED ORDER — DOCUSATE SODIUM 100 MG PO CAPS: 100.0000 mg | ORAL_CAPSULE | Freq: Two times a day (BID) | ORAL | Status: DC

## 2012-10-09 MED ORDER — SODIUM CHLORIDE 0.9 % IV BOLUS (SEPSIS)
1000.0000 mL | Freq: Once | INTRAVENOUS | Status: AC
  Administered 2012-10-09: 1000 mL via INTRAVENOUS

## 2012-10-09 MED ORDER — IOHEXOL 300 MG/ML  SOLN
100.0000 mL | Freq: Once | INTRAMUSCULAR | Status: AC | PRN
  Administered 2012-10-09: 80 mL via INTRAVENOUS

## 2012-10-09 MED ORDER — IOHEXOL 300 MG/ML  SOLN
25.0000 mL | INTRAMUSCULAR | Status: AC
  Administered 2012-10-09 (×2): 25 mL via ORAL

## 2012-10-09 MED ORDER — ONDANSETRON HCL 4 MG/2ML IJ SOLN
4.0000 mg | Freq: Once | INTRAMUSCULAR | Status: AC
  Administered 2012-10-09: 4 mg via INTRAVENOUS
  Filled 2012-10-09: qty 2

## 2012-10-09 MED ORDER — FLEET ENEMA 7-19 GM/118ML RE ENEM
1.0000 | ENEMA | Freq: Once | RECTAL | Status: AC
  Administered 2012-10-09: 1 via RECTAL
  Filled 2012-10-09: qty 1

## 2012-10-09 MED ORDER — POTASSIUM CHLORIDE CRYS ER 20 MEQ PO TBCR
40.0000 meq | EXTENDED_RELEASE_TABLET | Freq: Once | ORAL | Status: AC
  Administered 2012-10-09: 40 meq via ORAL
  Filled 2012-10-09: qty 2

## 2012-10-09 NOTE — ED Notes (Signed)
Pt c/o constipation and no BM x 6 days with abd pain and distention and N/V; pt sts some blood in rectum when straining

## 2012-10-09 NOTE — ED Provider Notes (Signed)
History    CSN: [id number] Arrival date & time 10/09/12  1006  First MD Initiated Contact with Patient 10/09/12 1107     Chief Complaint  Patient presents with  . Constipation  . Abdominal Pain   (Consider location/radiation/quality/duration/timing/severity/associated sxs/prior Treatment) Patient is a 79 y.o. female presenting with constipation and abdominal pain.  Constipation Associated symptoms: abdominal pain   Abdominal Pain Associated symptoms include abdominal pain.   Pt with history of chronic pain and constipation reports she has had not had a BM in about 5 days. She reports abdominal swelling and pain since that time, not improved with home laxatives, suppositories and attempts at self disimpaction. She was started on carafate about 2 weeks ago due to 'bile' and has had worsening problems since then including periodic nausea/vomiting. She called GI office today and was told to come to the ED for evaluation.   Past Medical History  Diagnosis Date  . Hx of cholecystectomy t  . S/P hysterectomy t  . S/P lumbar fusion t  . Hypertension   . Atrial fib/flutter, transient     2D Echo 07/06/2010  EF of 60 to 65%   . SOB (shortness of breath) 03/14/2011  . CAD (coronary artery disease) 03/14/2011  . PAF (paroxysmal atrial fibrillation) 03/14/2011  . Anticoagulated 03/14/2011  . Infarction splenic 03/14/2011  . Bradycardia 03/14/2011  . GERD (gastroesophageal reflux disease)   . Hiatal hernia   . Heart murmur   . Arthritis   . Thyroid disease   . CVA (cerebral vascular accident)     02/28/2010  . Bleeding 12/12    rectal  . COPD (chronic obstructive pulmonary disease)     on xray  . Hyperlipidemia, mixed   . Carotid artery disease     Carotid duplex doppler 06/04/11   Past Surgical History  Procedure Laterality Date  . Coronary artery bypass graft      CABG x 4  05/28/96  Dr Donata Clay  (left internal mammary artery to the left anterior descending saphenous vein graft  to the diagonal, saphenous vein graft to obtuse marginal, saphenous vein graft to posterior descending  . Bladder suspension    . Cataract extraction w/ intraocular lens  implant, bilateral    . Tee without cardioversion  03/16/2011    Procedure: TRANSESOPHAGEAL ECHOCARDIOGRAM (TEE);  Surgeon: Chrystie Nose;  Location: [facility] ENDOSCOPY;  Service: Cardiovascular;  Laterality: N/A;  . Colonoscopy  03/17/2011    Procedure: COLONOSCOPY;  Surgeon: Barrie Folk, MD;  Location: [facility] ENDOSCOPY;  Service: Endoscopy;  Laterality: N/A;  . Cholecystectomy    . Back surgery    . Abdominal hysterectomy  1974  . Eye surgery      both cataracts  . Cardiac catheterization      05/24/1996/Dr Riley Kill  revealed 3 vessel coronary artery disease,90-95% LAD, 70-80% mid LAD, 70% diagonal, 80% circumflex, 60-7-% circumflex lesions, 60% proximal RCA as well as 50-70, 60% distal RCA, 90% proximal  PDA with normal LV function;  03/10/1999, 12/04/1998, 05/01/2002, 06/18/2003, 08/08/2008, 12/20/11, 07/21/10    Family History  Problem Relation Age of Onset  . Cancer Mother     colon  . Cancer Son     colon   History  Substance Use Topics  . Smoking status: Never Smoker   . Smokeless tobacco: Never Used  . Alcohol Use: No   OB History   Grav Para Term Preterm Abortions TAB SAB Ect Mult Living  Review of Systems  Gastrointestinal: Positive for abdominal pain and constipation.   All other systems reviewed and are negative except as noted in HPI.   Allergies  Celebrex; Ciprofloxacin hcl; Codeine; Doxycycline; Famotidine; Levofloxacin; Macrolides and ketolides; Penicillins; Plavix; Warfarin sodium; and Amlodipine  Home Medications   Current Outpatient Rx  Name  Route  Sig  Dispense  Refill  . acetaminophen (TYLENOL) 325 MG tablet   Oral   Take 650 mg by mouth every 6 (six) hours as needed. For pain or fever          . aspirin 81 MG tablet   Oral   Take 81 mg by mouth daily.           Marland Kitchen  Bioflavonoid Products (ESTER C PO)   Oral   Take 500 mg by mouth daily.         . cholecalciferol (VITAMIN D) 1000 UNITS tablet   Oral   Take 2,000 Units by mouth daily.           Marland Kitchen diltiazem (CARDIZEM CD) 180 MG 24 hr capsule   Oral   Take 180 mg by mouth daily.         Marland Kitchen ezetimibe (ZETIA) 10 MG tablet   Oral   Take 10 mg by mouth daily.           . fluvastatin XL (LESCOL XL) 80 MG 24 hr tablet   Oral   Take 80 mg by mouth daily.           . furosemide (LASIX) 40 MG tablet   Oral   Take 20-40 mg by mouth daily. Alternates taking 20mg /40mg  daily         . HYDROmorphone (DILAUDID) 2 MG tablet   Oral   Take 1 tablet (2 mg total) by mouth every 4 (four) hours as needed for pain.   30 tablet   0   . hydroxypropyl methylcellulose (ISOPTO TEARS) 2.5 % ophthalmic solution   Both Eyes   Place 1 drop into both eyes at bedtime.         . isosorbide mononitrate (IMDUR) 120 MG 24 hr tablet   Oral   Take 1 tablet (120 mg total) by mouth daily.   90 tablet   3   . meloxicam (MOBIC) 7.5 MG tablet   Oral   Take 7.5 mg by mouth 2 (two) times daily as needed for pain.          . metoprolol (TOPROL-XL) 50 MG 24 hr tablet   Oral   Take 50 mg by mouth daily.          . Multiple Vitamin (MULITIVITAMIN WITH MINERALS) TABS   Oral   Take 1 tablet by mouth daily.         . nitroGLYCERIN (NITROSTAT) 0.4 MG SL tablet   Sublingual   Place 1 tablet (0.4 mg total) under the tongue every 5 (five) minutes x 3 doses as needed for chest pain.   25 tablet   3   . Omega-3 Fatty Acids (FISH OIL) 1200 MG CAPS   Oral   Take 1,200 mg by mouth daily.         Marland Kitchen omeprazole (PRILOSEC) 20 MG capsule   Oral   Take 20 mg by mouth daily.         . ondansetron (ZOFRAN) 4 MG tablet   Oral   Take 4 mg by mouth every 8 (eight) hours as needed for nausea.         Marland Kitchen  pantoprazole (PROTONIX) 40 MG tablet   Oral   Take 1 tablet (40 mg total) by mouth daily.   90 tablet   3    . polyethylene glycol (MIRALAX / GLYCOLAX) packet   Oral   Take 17 g by mouth daily.           . Rivaroxaban (XARELTO) 15 MG TABS tablet   Oral   Take 15 mg by mouth daily.          . sucralfate (CARAFATE) 1 G tablet   Oral   Take 1 g by mouth 4 (four) times daily.         Marland Kitchen zolpidem (AMBIEN) 10 MG tablet   Oral   Take 10 mg by mouth at bedtime.            BP 112/35  Pulse 68  Temp(Src) 98.2 F (36.8 C) (Oral)  Resp 18  SpO2 93% Physical Exam  Nursing note and vitals reviewed. Constitutional: She is oriented to person, place, and time. She appears well-developed and well-nourished.  HENT:  Head: Normocephalic and atraumatic.  Eyes: EOM are normal. Pupils are equal, round, and reactive to light.  Neck: Normal range of motion. Neck supple.  Cardiovascular: Normal rate, normal heart sounds and intact distal pulses.   Pulmonary/Chest: Effort normal and breath sounds normal.  Abdominal: Bowel sounds are normal. She exhibits distension. There is tenderness (diffuse). There is no rebound and no guarding.  Genitourinary:  Normal stool color, several moderate sized non-thrombosed hemorrhoids, small hard stool ball broken up digitally  Musculoskeletal: Normal range of motion. She exhibits no edema and no tenderness.  Neurological: She is alert and oriented to person, place, and time. She has normal strength. No cranial nerve deficit or sensory deficit.  Skin: Skin is warm and dry. No rash noted.  Psychiatric: She has a normal mood and affect.    ED Course  Procedures (including critical care time) Labs Reviewed  CBC WITH DIFFERENTIAL - Abnormal; Notable for the following:    WBC 11.9 (*)    Neutrophils Relative % 79 (*)    Neutro Abs 9.5 (*)    Lymphocytes Relative 11 (*)    Monocytes Absolute 1.1 (*)    All other components within normal limits  COMPREHENSIVE METABOLIC PANEL - Abnormal; Notable for the following:    Potassium 2.7 (*)    Chloride 95 (*)    CO2 34  (*)    Glucose, Bld 120 (*)    Albumin 3.3 (*)    Total Bilirubin 2.0 (*)    GFR calc non Af Amer 75 (*)    GFR calc Af Amer 87 (*)    All other components within normal limits  URINALYSIS, ROUTINE W REFLEX MICROSCOPIC - Abnormal; Notable for the following:    Ketones, ur 15 (*)    Leukocytes, UA TRACE (*)    All other components within normal limits  URINE MICROSCOPIC-ADD ON   Ct Abdomen Pelvis W Contrast  10/09/2012   *RADIOLOGY REPORT*  Clinical Data: Abdominal distension, vomiting, constipation.  Prior cholecystectomy.  CT ABDOMEN AND PELVIS WITH CONTRAST  Technique:  Multidetector CT imaging of the abdomen and pelvis was performed following the standard protocol during bolus administration of intravenous contrast.  Contrast: 80mL OMNIPAQUE IOHEXOL 300 MG/ML  SOLN  Comparison: 10/07/2010  Findings: Lung bases are essentially clear.  Multiple probable hepatic cysts measuring up to 1.4 cm.  Spleen, pancreas, and adrenal glands are within normal limits.  Status post  cholecystectomy.  No intrahepatic or extrahepatic ductal dilatation.  11 mm medial right lower pole renal cyst (series 5/image 21). Additional tiny cortical cysts bilaterally.  Cortical atrophy / scarring along the medial right lower pole (series 5/image 27).  No evidence of bowel obstruction. Normal appendix.  Duodenal diverticulum.  Colonic diverticulosis, without associated inflammatory changes.  Atherosclerotic calcifications of the abdominal aorta and branch vessels.  No abdominopelvic ascites.  No suspicious abdominopelvic lymphadenopathy.  Status post hysterectomy.  Bilateral ovaries are unremarkable.  Bladder is within normal limits.  Small right and tiny left fat containing inguinal hernias.  Coarse calcification in the soft tissues of the left lower back / flank (series 5/image 31), chronic.  Degenerative changes of the visualized thoracolumbar spine.  Status post PLIF at L3-5.  IMPRESSION: No evidence of bowel obstruction.   Normal appendix.  Cortical scarring/atrophy along the medial right lower kidney.  Additional stable ancillary findings as above.   Original Report Authenticated By: Charline Bills, M.D.   Dg Abd Acute W/chest  10/09/2012   *RADIOLOGY REPORT*  Clinical Data: Abdominal pain, constipation  ACUTE ABDOMEN SERIES (ABDOMEN 2 VIEW & CHEST 1 VIEW)  Comparison: Chest x-ray of 12/17/2011  Findings: No active infiltrate or effusion is seen.  Mild cardiomegaly is stable.  Median sternotomy sutures are noted from prior CABG.  Supine and erect views of the abdomen show no bowel obstruction. No free air is seen.  No radiographic evidence of constipation is noted.  Hardware for posterior fusion from L3-L5 is present and there is degenerative disc disease at the L2-3 level.  Surgical clips are noted within the right upper quadrant from prior cholecystectomy.  The bones are osteopenic.  IMPRESSION:  1.  Stable mild cardiomegaly.  No active lung disease. 2.  No bowel obstruction or free air.  No radiographic evidence of constipation.   Original Report Authenticated By: Dwyane Dee, M.D.   1. Constipation     MDM  Labs and imaging reviewed. Pt with ?urinary retention by history with moderately enlarged bladder on CT. Foley placed with approx 400cc urine out, and feeling some better. Foley removed prior to discharge. Advised close PCP and GI followup. Additional stool softeners if constipated.   Dwayne Begay B. Bernette Mayers, MD 10/09/12 1750

## 2012-10-09 NOTE — ED Notes (Signed)
Critical lab potassium 2.7

## 2012-10-12 ENCOUNTER — Ambulatory Visit (INDEPENDENT_AMBULATORY_CARE_PROVIDER_SITE_OTHER): Payer: Medicare Other | Admitting: Cardiovascular Disease

## 2012-10-12 ENCOUNTER — Encounter: Payer: Self-pay | Admitting: Cardiovascular Disease

## 2012-10-12 VITALS — BP 108/72 | HR 80 | Ht 62.0 in | Wt 115.0 lb

## 2012-10-12 DIAGNOSIS — I4891 Unspecified atrial fibrillation: Secondary | ICD-10-CM

## 2012-10-12 DIAGNOSIS — I2510 Atherosclerotic heart disease of native coronary artery without angina pectoris: Secondary | ICD-10-CM

## 2012-10-12 DIAGNOSIS — I482 Chronic atrial fibrillation, unspecified: Secondary | ICD-10-CM

## 2012-10-12 NOTE — Assessment & Plan Note (Signed)
Rate controlled on Xarelto anticoagulation

## 2012-10-12 NOTE — Progress Notes (Signed)
10/12/2012 Cathy Ortega   1922/07/24  [id number]  Primary Physician Colette Ribas, MD Primary Cardiologist: [name] MD [name]   HPI:  The patient is an 79 year old thin and frail appearing widowed Caucasian female mother of 3 who is accompanied by one of her daughters today. I last saw her 2 months ago. She has a history of CAD status post coronary artery bypass grafting in 1998. She was catheterized in 2000, 2005, 2010, 2012 and most recently December 20, 2011 revealing intact vein grafts, an atretic LIMA with normal LV function and 2+ MR. She did have a 95% LAD lesion beyond an occluded proximal LAD which was filled retrograde by a diagonal branch vein graft. She has chronic atrial fibrillation on Xarelto. She had unsuccessful attempts at cardioversion because of residual "smoke" in her left atrial appendage. Her other problems include hypertension and hyperlipidemia. She does complain of some dyspnea but has denied chest pain. Saw her 06/30/12 her sutures and has remained remarkably stable. She has had some back and hip pain as well as a urologic problem.      Current Outpatient Prescriptions  Medication Sig Dispense Refill  . acetaminophen (TYLENOL) 325 MG tablet Take 650 mg by mouth every 6 (six) hours as needed. For pain or fever       . aspirin 81 MG tablet Take 81 mg by mouth daily.        Marland Kitchen Bioflavonoid Products (ESTER C PO) Take 500 mg by mouth daily.      . cholecalciferol (VITAMIN D) 1000 UNITS tablet Take 2,000 Units by mouth daily.        Marland Kitchen diltiazem (CARDIZEM CD) 180 MG 24 hr capsule Take 180 mg by mouth daily.      Marland Kitchen docusate sodium (COLACE) 100 MG capsule Take 1 capsule (100 mg total) by mouth every 12 (twelve) hours.  60 capsule  0  . ezetimibe (ZETIA) 10 MG tablet Take 10 mg by mouth daily.        . fluvastatin XL (LESCOL XL) 80 MG 24 hr tablet Take 80 mg by mouth daily.        . furosemide (LASIX) 40 MG tablet Take 20-40 mg by mouth daily.  Alternates taking 20mg /40mg  daily      . HYDROmorphone (DILAUDID) 2 MG tablet Take 2 mg by mouth 2 (two) times daily as needed for pain.      . hydroxypropyl methylcellulose (ISOPTO TEARS) 2.5 % ophthalmic solution Place 1 drop into both eyes at bedtime.      . isosorbide mononitrate (IMDUR) 120 MG 24 hr tablet Take 1 tablet (120 mg total) by mouth daily.  90 tablet  3  . metoprolol (TOPROL-XL) 50 MG 24 hr tablet Take 50 mg by mouth daily.       . Multiple Vitamin (MULITIVITAMIN WITH MINERALS) TABS Take 1 tablet by mouth daily.      . nitroGLYCERIN (NITROSTAT) 0.4 MG SL tablet Place 1 tablet (0.4 mg total) under the tongue every 5 (five) minutes x 3 doses as needed for chest pain.  25 tablet  3  . Omega-3 Fatty Acids (FISH OIL) 1200 MG CAPS Take 1,200 mg by mouth daily.      Marland Kitchen omeprazole (PRILOSEC) 20 MG capsule Take 20 mg by mouth daily.      . ondansetron (ZOFRAN) 4 MG tablet Take 4 mg by mouth every 8 (eight) hours as needed for nausea.      . pantoprazole (PROTONIX) 40  MG tablet Take 1 tablet (40 mg total) by mouth daily.  90 tablet  3  . polyethylene glycol (MIRALAX / GLYCOLAX) packet Take 17 g by mouth daily.        . Rivaroxaban (XARELTO) 15 MG TABS tablet Take 15 mg by mouth daily.       . sucralfate (CARAFATE) 1 G tablet Take 1 g by mouth 4 (four) times daily.      Marland Kitchen zolpidem (AMBIEN) 10 MG tablet Take 10 mg by mouth at bedtime.         No current facility-administered medications for this visit.    Allergies  Allergen Reactions  . Celebrex (Celecoxib) Other (See Comments)    Unknown   . Ciprofloxacin Hcl Other (See Comments)    unknown  . Codeine Other (See Comments)    unknown  . Doxycycline Other (See Comments)    unknown  . Famotidine Other (See Comments)    unknown  . Levofloxacin Other (See Comments)    unknown  . Macrolides And Ketolides Other (See Comments)    unknown  . Penicillins Other (See Comments)    Unknown; possible cross reactor with eggs.   . Plavix  (Clopidogrel Bisulfate) Other (See Comments)    Unknown   . Warfarin Sodium Other (See Comments)    Makes my skin bleed and also caused internal bleeding  . Amlodipine Rash    History   Social History  . Marital Status: Widowed    Spouse Name: N/A    Number of Children: N/A  . Years of Education: N/A   Occupational History  . Not on file.   Social History Main Topics  . Smoking status: Never Smoker   . Smokeless tobacco: Never Used  . Alcohol Use: No  . Drug Use: No  . Sexually Active: Not on file   Other Topics Concern  . Not on file   Social History Narrative  . No narrative on file     Review of Systems: General: negative for chills, fever, night sweats or weight changes.  Cardiovascular: negative for chest pain, dyspnea on exertion, edema, orthopnea, palpitations, paroxysmal nocturnal dyspnea or shortness of breath Dermatological: negative for rash Respiratory: negative for cough or wheezing Urologic: negative for hematuria Abdominal: negative for nausea, vomiting, diarrhea, bright red blood per rectum, melena, or hematemesis Neurologic: negative for visual changes, syncope, or dizziness All other systems reviewed and are otherwise negative except as noted above.    Blood pressure 108/72, pulse 80, height 5\' 2"  (1.575 m), weight 115 lb (52.164 kg).  General appearance: alert and no distress Neck: no adenopathy, no JVD, supple, symmetrical, trachea midline, thyroid not enlarged, symmetric, no tenderness/mass/nodules and soft left carotid bruit Lungs: clear to auscultation bilaterally Heart: irregularly irregular rhythm Extremities: extremities normal, atraumatic, no cyanosis or edema  EKG atrial fibrillation with a ventricular response of 80  ASSESSMENT AND PLAN:   CAD, CABG 1998, cath March 2012- medical Rx Status post coronary artery bypass graft in 1998 with multiple catheterizations since that time. Her last cardiac catheterization was performed by me  12/20/11 because of chest pain. Her anatomy was unchanged. Her vein grafts are widely patent and her LIMA was atretic. Her LV function was normal. Continue medical therapy was recommended. She's had no further chest pain or shortness of breath.  Chronic atrial fibrillation - AA thrombus Rate controlled on Xarelto anticoagulation      [name] MD [facility], [facility] 10/12/2012 12:46 PM

## 2012-10-12 NOTE — Assessment & Plan Note (Signed)
Status post coronary artery bypass graft in 1998 with multiple catheterizations since that time. Her last cardiac catheterization was performed by me 12/20/11 because of chest pain. Her anatomy was unchanged. Her vein grafts are widely patent and her LIMA was atretic. Her LV function was normal. Continue medical therapy was recommended. She's had no further chest pain or shortness of breath.

## 2012-10-12 NOTE — Patient Instructions (Addendum)
Your physician wants you to follow-up in:  6 months. You will receive a reminder letter in the mail two months in advance. If you don't receive a letter, please call our office to schedule the follow-up appointment.   

## 2012-10-16 ENCOUNTER — Other Ambulatory Visit: Payer: Self-pay | Admitting: *Deleted

## 2012-10-16 MED ORDER — FUROSEMIDE 40 MG PO TABS: 20.0000 mg | ORAL_TABLET | Freq: Every day | ORAL | Status: DC

## 2012-10-19 ENCOUNTER — Other Ambulatory Visit: Payer: Self-pay | Admitting: *Deleted

## 2012-10-19 MED ORDER — FUROSEMIDE 40 MG PO TABS: ORAL_TABLET | ORAL | Status: DC

## 2012-11-02 ENCOUNTER — Telehealth: Payer: Self-pay | Admitting: Cardiovascular Disease

## 2012-11-02 MED ORDER — FUROSEMIDE 40 MG PO TABS: ORAL_TABLET | ORAL | Status: DC

## 2012-11-02 NOTE — Telephone Encounter (Signed)
Call to [facility].  Rx clarified per directions in Epic for furosemide: Take 40 mg every other day, alternating 20 mg every other day.  90-day supply authorized w/ 3 refills.  Returned call to pt and informed.  Pt verbalized understanding and agreed w/ plan.  Temporary Rx sent to [facility] per pt request until mail order arrives as she is almost out.

## 2012-11-02 NOTE — Telephone Encounter (Signed)
Per [facility] they need a new prescription for Cathy Ortega Furosemide 20mg  , because the one they have has no refills on it and they were unable to process the refill request that was sent on 10/19/12.Marland Kitchen Please call them at [phone]..    Thanks

## 2012-11-09 ENCOUNTER — Telehealth: Payer: Self-pay | Admitting: Cardiovascular Disease

## 2012-11-09 NOTE — Telephone Encounter (Signed)
Please call-having problem getting her medicine-very upset-not on our part.They will not give her 20mg  of Lasix

## 2012-11-09 NOTE — Telephone Encounter (Signed)
Returned call.  Pt stated [facility] won't send her fluid pill in 20 mg.  Pt read directions on Rx for furosemide (see medications).  Pt informed 40 mg tabs were selected so that she can take 1 whole tab one day and 1/2 tab the next day.  Pt advised to cut pills in 1/2 on days when she has to take a 1/2 tab.  Asked pt if she has a pill cutter and pt stated she does.  Pt stated she is aware of how to take her meds and is very clear in her mind.  Pt stated she just wanted to hear from [place] if that's how Dr. Allyson Sabal wanted her to take it.  Pt informed those were the instructions given.  Pt verbalized understanding and agreed w/ plan.

## 2012-11-15 ENCOUNTER — Other Ambulatory Visit: Payer: Self-pay

## 2012-11-20 ENCOUNTER — Ambulatory Visit (HOSPITAL_COMMUNITY): Payer: Medicare Other

## 2012-11-22 ENCOUNTER — Telehealth: Payer: Self-pay | Admitting: Cardiovascular Disease

## 2012-11-22 MED ORDER — NITROGLYCERIN 0.4 MG SL SUBL: 0.4000 mg | SUBLINGUAL_TABLET | SUBLINGUAL | Status: DC | PRN

## 2012-11-22 NOTE — Telephone Encounter (Signed)
Returned call.  Pt c/o 4 blood vessels bursting in her R eye since last week.  Also c/o intermittent CP.  No CP today.  Has NTG, but may be expired.  Pt advised to discard and informed RN will send in new Rx.  Sherian Rein, NP notified and advised pt Hold ASA x 1 week to see how that does, but do NOT stop Xarelto.  Pt informed and verbalized understanding.  Pt agreed w/ plan.  Refill(s) sent to pharmacy.

## 2012-11-22 NOTE — Telephone Encounter (Signed)
Blood vessels bursting in her eye.  She has had 4 since last week.  Not sure what she should do.

## 2012-11-29 ENCOUNTER — Encounter (INDEPENDENT_AMBULATORY_CARE_PROVIDER_SITE_OTHER): Payer: Medicare Other | Admitting: Cardiovascular Disease

## 2012-11-29 ENCOUNTER — Encounter: Payer: Self-pay | Admitting: Cardiovascular Disease

## 2012-11-29 VITALS — BP 130/70 | HR 79 | Ht 62.0 in | Wt 116.0 lb

## 2012-11-29 DIAGNOSIS — I4891 Unspecified atrial fibrillation: Secondary | ICD-10-CM

## 2012-11-29 NOTE — Progress Notes (Signed)
This encounter was created in error - please disregard.

## 2013-01-05 ENCOUNTER — Ambulatory Visit (HOSPITAL_COMMUNITY)
Admission: RE | Admit: 2013-01-05 | Discharge: 2013-01-05 | Disposition: A | Discharge status: Home / Self Care | Payer: Medicare Other | Source: Ambulatory Visit | Attending: Family Medicine | Admitting: Family Medicine

## 2013-01-05 DIAGNOSIS — Z1231 Encounter for screening mammogram for malignant neoplasm of breast: Secondary | ICD-10-CM | POA: Insufficient documentation

## 2013-01-05 DIAGNOSIS — Z139 Encounter for screening, unspecified: Secondary | ICD-10-CM

## 2013-02-15 ENCOUNTER — Other Ambulatory Visit: Payer: Self-pay

## 2013-02-28 ENCOUNTER — Encounter (HOSPITAL_COMMUNITY): Payer: Self-pay | Admitting: Cardiology

## 2013-02-28 ENCOUNTER — Telehealth: Payer: Self-pay | Admitting: Cardiovascular Disease

## 2013-02-28 ENCOUNTER — Inpatient Hospital Stay (HOSPITAL_COMMUNITY)
Admission: AD | Admit: 2013-02-28 | Discharge: 2013-03-03 | DRG: 287 | Disposition: A | Discharge status: Home / Self Care | Payer: Medicare Other | Source: Ambulatory Visit | Attending: Cardiovascular Disease | Admitting: Cardiovascular Disease

## 2013-02-28 ENCOUNTER — Encounter: Payer: Self-pay | Admitting: Cardiology

## 2013-02-28 ENCOUNTER — Inpatient Hospital Stay (HOSPITAL_COMMUNITY): Payer: Medicare Other

## 2013-02-28 ENCOUNTER — Ambulatory Visit (INDEPENDENT_AMBULATORY_CARE_PROVIDER_SITE_OTHER): Payer: Medicare Other | Admitting: Cardiology

## 2013-02-28 VITALS — BP 162/82 | HR 63 | Ht 62.0 in | Wt 115.0 lb

## 2013-02-28 DIAGNOSIS — I200 Unstable angina: Secondary | ICD-10-CM | POA: Insufficient documentation

## 2013-02-28 DIAGNOSIS — R001 Bradycardia, unspecified: Secondary | ICD-10-CM

## 2013-02-28 DIAGNOSIS — R0609 Other forms of dyspnea: Secondary | ICD-10-CM

## 2013-02-28 DIAGNOSIS — R55 Syncope and collapse: Secondary | ICD-10-CM | POA: Diagnosis present

## 2013-02-28 DIAGNOSIS — M129 Arthropathy, unspecified: Secondary | ICD-10-CM | POA: Diagnosis present

## 2013-02-28 DIAGNOSIS — I25810 Atherosclerosis of coronary artery bypass graft(s) without angina pectoris: Secondary | ICD-10-CM | POA: Diagnosis present

## 2013-02-28 DIAGNOSIS — I2510 Atherosclerotic heart disease of native coronary artery without angina pectoris: Secondary | ICD-10-CM

## 2013-02-28 DIAGNOSIS — Z79899 Other long term (current) drug therapy: Secondary | ICD-10-CM

## 2013-02-28 DIAGNOSIS — I4892 Unspecified atrial flutter: Secondary | ICD-10-CM | POA: Diagnosis present

## 2013-02-28 DIAGNOSIS — Z7901 Long term (current) use of anticoagulants: Secondary | ICD-10-CM

## 2013-02-28 DIAGNOSIS — R0602 Shortness of breath: Secondary | ICD-10-CM

## 2013-02-28 DIAGNOSIS — I513 Intracardiac thrombosis, not elsewhere classified: Secondary | ICD-10-CM

## 2013-02-28 DIAGNOSIS — I2789 Other specified pulmonary heart diseases: Secondary | ICD-10-CM | POA: Diagnosis present

## 2013-02-28 DIAGNOSIS — I208 Other forms of angina pectoris: Secondary | ICD-10-CM

## 2013-02-28 DIAGNOSIS — I4891 Unspecified atrial fibrillation: Secondary | ICD-10-CM

## 2013-02-28 DIAGNOSIS — Z8673 Personal history of transient ischemic attack (TIA), and cerebral infarction without residual deficits: Secondary | ICD-10-CM

## 2013-02-28 DIAGNOSIS — I10 Essential (primary) hypertension: Secondary | ICD-10-CM | POA: Diagnosis present

## 2013-02-28 DIAGNOSIS — I482 Chronic atrial fibrillation, unspecified: Secondary | ICD-10-CM

## 2013-02-28 DIAGNOSIS — J449 Chronic obstructive pulmonary disease, unspecified: Secondary | ICD-10-CM | POA: Diagnosis present

## 2013-02-28 DIAGNOSIS — J4489 Other specified chronic obstructive pulmonary disease: Secondary | ICD-10-CM | POA: Diagnosis present

## 2013-02-28 DIAGNOSIS — I498 Other specified cardiac arrhythmias: Secondary | ICD-10-CM | POA: Diagnosis present

## 2013-02-28 DIAGNOSIS — K219 Gastro-esophageal reflux disease without esophagitis: Secondary | ICD-10-CM | POA: Diagnosis present

## 2013-02-28 DIAGNOSIS — I639 Cerebral infarction, unspecified: Secondary | ICD-10-CM

## 2013-02-28 DIAGNOSIS — R0989 Other specified symptoms and signs involving the circulatory and respiratory systems: Secondary | ICD-10-CM | POA: Diagnosis present

## 2013-02-28 DIAGNOSIS — Z981 Arthrodesis status: Secondary | ICD-10-CM

## 2013-02-28 DIAGNOSIS — Z7982 Long term (current) use of aspirin: Secondary | ICD-10-CM

## 2013-02-28 DIAGNOSIS — E782 Mixed hyperlipidemia: Secondary | ICD-10-CM | POA: Diagnosis present

## 2013-02-28 DIAGNOSIS — K449 Diaphragmatic hernia without obstruction or gangrene: Secondary | ICD-10-CM | POA: Diagnosis present

## 2013-02-28 LAB — COMPREHENSIVE METABOLIC PANEL
ALT: 16 U/L (ref 0–35)
Albumin: 4.2 g/dL (ref 3.5–5.2)
Alkaline Phosphatase: 83 U/L (ref 39–117)
Calcium: 10.4 mg/dL (ref 8.4–10.5)
Chloride: 97 mEq/L (ref 96–112)
GFR calc Af Amer: 71 mL/min — ABNORMAL LOW (ref >90)
Glucose, Bld: 124 mg/dL — ABNORMAL HIGH (ref 70–99)
Sodium: 139 mEq/L (ref 135–145)
Total Protein: 7.7 g/dL (ref 6.0–8.3)

## 2013-02-28 LAB — CK TOTAL AND CKMB (NOT AT ARMC): Total CK: 67 U/L (ref 7–177)

## 2013-02-28 LAB — CBC WITH DIFFERENTIAL/PLATELET
Basophils Relative: 0 % (ref 0–1)
Eosinophils Absolute: 0.1 10*3/uL (ref 0.0–0.7)
Eosinophils Relative: 1 % (ref 0–5)
Lymphs Abs: 1.8 10*3/uL (ref 0.7–4.0)
MCH: 30.6 pg (ref 26.0–34.0)
MCHC: 34.3 g/dL (ref 30.0–36.0)
Monocytes Absolute: 0.6 10*3/uL (ref 0.1–1.0)
Neutrophils Relative %: 64 % (ref 43–77)
Platelets: 277 10*3/uL (ref 150–400)

## 2013-02-28 LAB — HEPARIN LEVEL (UNFRACTIONATED): Heparin Unfractionated: 1.82 IU/mL — ABNORMAL HIGH (ref 0.30–0.70)

## 2013-02-28 LAB — PRO B NATRIURETIC PEPTIDE: Pro B Natriuretic peptide (BNP): 863.2 pg/mL — ABNORMAL HIGH (ref 0–450)

## 2013-02-28 LAB — TROPONIN I: Troponin I: <0.3 ng/mL (ref <0.30)

## 2013-02-28 LAB — HEMOGLOBIN A1C
Hgb A1c MFr Bld: 5.8 % — ABNORMAL HIGH (ref <5.7)
Mean Plasma Glucose: 120 mg/dL — ABNORMAL HIGH (ref <117)

## 2013-02-28 LAB — PROTIME-INR
INR: 1.21 (ref 0.00–1.49)
Prothrombin Time: 15 seconds (ref 11.6–15.2)

## 2013-02-28 LAB — TSH: TSH: 3.052 u[IU]/mL (ref 0.350–4.500)

## 2013-02-28 LAB — MRSA PCR SCREENING: MRSA by PCR: NEGATIVE

## 2013-02-28 MED ORDER — DIAZEPAM 2 MG PO TABS
2.0000 mg | ORAL_TABLET | ORAL | Status: AC
  Administered 2013-02-28: 2 mg via ORAL
  Filled 2013-02-28: qty 1

## 2013-02-28 MED ORDER — FLUVASTATIN SODIUM 40 MG PO CAPS
80.0000 mg | ORAL_CAPSULE | Freq: Every day | ORAL | Status: DC
  Filled 2013-02-28: qty 2

## 2013-02-28 MED ORDER — ALPRAZOLAM 0.25 MG PO TABS: 0.2500 mg | ORAL_TABLET | Freq: Two times a day (BID) | ORAL | Status: DC | PRN

## 2013-02-28 MED ORDER — OMEGA-3-ACID ETHYL ESTERS 1 G PO CAPS
1.0000 g | ORAL_CAPSULE | Freq: Every day | ORAL | Status: DC
  Administered 2013-02-28 – 2013-03-02 (×3): 1 g via ORAL
  Filled 2013-02-28 (×6): qty 1

## 2013-02-28 MED ORDER — POLYETHYLENE GLYCOL 3350 17 G PO PACK
17.0000 g | PACK | Freq: Every day | ORAL | Status: DC
  Administered 2013-03-02: 17 g via ORAL
  Filled 2013-02-28 (×5): qty 1

## 2013-02-28 MED ORDER — SODIUM CHLORIDE 0.9 % IV SOLN
INTRAVENOUS | Status: DC
  Administered 2013-02-28: 1000 mL via INTRAVENOUS
  Filled 2013-02-28: qty 1000

## 2013-02-28 MED ORDER — RANOLAZINE ER 500 MG PO TB12
500.0000 mg | ORAL_TABLET | Freq: Two times a day (BID) | ORAL | Status: DC
  Administered 2013-02-28 – 2013-03-03 (×4): 500 mg via ORAL
  Filled 2013-02-28 (×7): qty 1

## 2013-02-28 MED ORDER — ASPIRIN 81 MG PO CHEW: 81.0000 mg | CHEWABLE_TABLET | Freq: Every day | ORAL | Status: DC

## 2013-02-28 MED ORDER — SUCRALFATE 1 G PO TABS
1.0000 g | ORAL_TABLET | Freq: Four times a day (QID) | ORAL | Status: DC
  Administered 2013-02-28 – 2013-03-03 (×8): 1 g via ORAL
  Filled 2013-02-28 (×15): qty 1

## 2013-02-28 MED ORDER — HEPARIN (PORCINE) IN NACL 100-0.45 UNIT/ML-% IJ SOLN
700.0000 [IU]/h | INTRAMUSCULAR | Status: DC
  Administered 2013-02-28: 700 [IU]/h via INTRAVENOUS
  Filled 2013-02-28: qty 250

## 2013-02-28 MED ORDER — PANTOPRAZOLE SODIUM 40 MG PO TBEC
40.0000 mg | DELAYED_RELEASE_TABLET | Freq: Every day | ORAL | Status: DC
  Administered 2013-02-28 – 2013-03-02 (×2): 40 mg via ORAL
  Filled 2013-02-28 (×2): qty 1

## 2013-02-28 MED ORDER — DILTIAZEM HCL ER COATED BEADS 180 MG PO CP24
180.0000 mg | ORAL_CAPSULE | Freq: Every day | ORAL | Status: DC
  Filled 2013-02-28 (×2): qty 1

## 2013-02-28 MED ORDER — EZETIMIBE 10 MG PO TABS
10.0000 mg | ORAL_TABLET | Freq: Every day | ORAL | Status: DC
  Administered 2013-02-28 – 2013-03-02 (×3): 10 mg via ORAL
  Filled 2013-02-28 (×6): qty 1

## 2013-02-28 MED ORDER — NITROGLYCERIN 0.4 MG SL SUBL: 0.4000 mg | SUBLINGUAL_TABLET | SUBLINGUAL | Status: DC | PRN

## 2013-02-28 MED ORDER — ADULT MULTIVITAMIN W/MINERALS CH
1.0000 | ORAL_TABLET | Freq: Every day | ORAL | Status: DC
  Administered 2013-02-28 – 2013-03-02 (×3): 1 via ORAL
  Filled 2013-02-28 (×6): qty 1

## 2013-02-28 MED ORDER — ACETAMINOPHEN 325 MG PO TABS: 650.0000 mg | ORAL_TABLET | Freq: Four times a day (QID) | ORAL | Status: DC | PRN

## 2013-02-28 MED ORDER — ZOLPIDEM TARTRATE 5 MG PO TABS
5.0000 mg | ORAL_TABLET | Freq: Every day | ORAL | Status: DC
  Administered 2013-02-28 – 2013-03-02 (×3): 5 mg via ORAL
  Filled 2013-02-28 (×3): qty 1

## 2013-02-28 MED ORDER — SIMVASTATIN 40 MG PO TABS
40.0000 mg | ORAL_TABLET | Freq: Every day | ORAL | Status: DC
Start: 2013-02-28 — End: 2013-02-28
  Administered 2013-02-28: 40 mg via ORAL
  Filled 2013-02-28 (×2): qty 1

## 2013-02-28 MED ORDER — METOPROLOL TARTRATE 12.5 MG HALF TABLET
12.5000 mg | ORAL_TABLET | Freq: Two times a day (BID) | ORAL | Status: DC
  Administered 2013-03-01 – 2013-03-03 (×3): 12.5 mg via ORAL
  Filled 2013-02-28 (×8): qty 1

## 2013-02-28 MED ORDER — DOCUSATE SODIUM 100 MG PO CAPS
100.0000 mg | ORAL_CAPSULE | Freq: Two times a day (BID) | ORAL | Status: DC
  Administered 2013-02-28 – 2013-03-03 (×5): 100 mg via ORAL
  Filled 2013-02-28 (×9): qty 1

## 2013-02-28 MED ORDER — METOPROLOL SUCCINATE ER 25 MG PO TB24
75.0000 mg | ORAL_TABLET | Freq: Every day | ORAL | Status: DC
  Filled 2013-02-28 (×2): qty 1

## 2013-02-28 MED ORDER — HYDROMORPHONE HCL 2 MG PO TABS: 2.0000 mg | ORAL_TABLET | Freq: Two times a day (BID) | ORAL | Status: DC | PRN

## 2013-02-28 MED ORDER — SODIUM CHLORIDE 0.9 % IJ SOLN: 3.0000 mL | INTRAMUSCULAR | Status: DC | PRN

## 2013-02-28 MED ORDER — ONDANSETRON HCL 4 MG/2ML IJ SOLN: 4.0000 mg | Freq: Four times a day (QID) | INTRAMUSCULAR | Status: DC | PRN

## 2013-02-28 MED ORDER — ACETAMINOPHEN 325 MG PO TABS: 650.0000 mg | ORAL_TABLET | ORAL | Status: DC | PRN

## 2013-02-28 MED ORDER — POLYVINYL ALCOHOL 1.4 % OP SOLN
1.0000 [drp] | Freq: Every day | OPHTHALMIC | Status: DC
  Administered 2013-02-28 – 2013-03-02 (×4): 1 [drp] via OPHTHALMIC
  Filled 2013-02-28: qty 15

## 2013-02-28 MED ORDER — ASPIRIN EC 81 MG PO TBEC
81.0000 mg | DELAYED_RELEASE_TABLET | Freq: Every day | ORAL | Status: DC
  Administered 2013-03-02 – 2013-03-03 (×2): 81 mg via ORAL
  Filled 2013-02-28 (×3): qty 1

## 2013-02-28 MED ORDER — DILTIAZEM HCL 60 MG PO TABS
60.0000 mg | ORAL_TABLET | Freq: Two times a day (BID) | ORAL | Status: DC
  Administered 2013-03-01 – 2013-03-03 (×3): 60 mg via ORAL
  Filled 2013-02-28 (×7): qty 1

## 2013-02-28 MED ORDER — SODIUM CHLORIDE 0.9 % IV SOLN: 250.0000 mL | INTRAVENOUS | Status: DC | PRN

## 2013-02-28 MED ORDER — SODIUM CHLORIDE 0.9 % IJ SOLN
3.0000 mL | Freq: Two times a day (BID) | INTRAMUSCULAR | Status: DC
  Administered 2013-02-28 – 2013-03-01 (×2): 3 mL via INTRAVENOUS

## 2013-02-28 MED ORDER — HYPROMELLOSE (GONIOSCOPIC) 2.5 % OP SOLN
1.0000 [drp] | Freq: Every day | OPHTHALMIC | Status: DC
  Filled 2013-02-28: qty 15

## 2013-02-28 MED ORDER — NITROGLYCERIN IN D5W 200-5 MCG/ML-% IV SOLN
5.0000 ug/min | INTRAVENOUS | Status: DC
  Administered 2013-02-28: 5 ug/min via INTRAVENOUS
  Filled 2013-02-28 (×3): qty 250

## 2013-02-28 MED ORDER — VITAMIN D3 25 MCG (1000 UNIT) PO TABS
2000.0000 [IU] | ORAL_TABLET | Freq: Every day | ORAL | Status: DC
  Administered 2013-02-28 – 2013-03-01 (×2): 2000 [IU] via ORAL
  Filled 2013-02-28 (×4): qty 2

## 2013-02-28 MED ORDER — SODIUM CHLORIDE 0.9 % IV SOLN
INTRAVENOUS | Status: DC
  Administered 2013-03-01: 50 mL/h via INTRAVENOUS

## 2013-02-28 MED ORDER — FUROSEMIDE 10 MG/ML IJ SOLN
40.0000 mg | Freq: Once | INTRAMUSCULAR | Status: AC
  Administered 2013-02-28: 40 mg via INTRAVENOUS
  Filled 2013-02-28: qty 4

## 2013-02-28 MED ORDER — ASPIRIN 81 MG PO CHEW
81.0000 mg | CHEWABLE_TABLET | ORAL | Status: AC
  Administered 2013-03-01: 81 mg via ORAL
  Filled 2013-02-28: qty 1

## 2013-02-28 NOTE — Progress Notes (Signed)
Nurse notified for Nitroglycerin twice per [facility] in addition to contacting pharmacy via phone. Nitroglycerin has not arrived as of yet to administer to pt. Nurse will communicate this to on coming night nurse.

## 2013-02-28 NOTE — Assessment & Plan Note (Signed)
Increasing chest pressure, DOE and near syncope.  Increasing rapidly over last month now chest pressure awakens from sleep. During the day may have 2-3 episodes.

## 2013-02-28 NOTE — Assessment & Plan Note (Signed)
Plan to admit, IV NTG, check CXR, last dose of Xarelto was yesterday.  Begin hep. Restart asa, eval enzymes.  Begin ranexa.  Cath tomorrow to reeval.

## 2013-02-28 NOTE — Assessment & Plan Note (Signed)
Last dose yesterday

## 2013-02-28 NOTE — Patient Instructions (Signed)
Go to admit at [facility].

## 2013-02-28 NOTE — H&P (Signed)
Cathy Ortega is an 79 y.o. female.    Primary Cardiologist:Dr. Allyson Sabal  AOZ:[id number], Chancy Hurter, MD  Chief Complaint: DOE, chest pressure, near syncope  HPI: The patient is an 79 year old thin and frail appearing widowed Caucasian female mother of 3 who is accompanied by one of her daughters today. She was last seen by Dr. Allyson Sabal 2 months ago. She has a history of CAD status post coronary artery bypass grafting in 1998. She was catheterized in 2000, 2005, 2010, 2012 and most recently December 20, 2011 revealing intact vein grafts, an atretic LIMA with normal LV function and 2+ MR. She did have a 95% LAD lesion beyond an occluded proximal LAD which was filled retrograde by a diagonal branch vein graft. She has chronic atrial fibrillation on Xarelto. She had unsuccessful attempts at cardioversion because of residual "smoke" in her left atrial appendage. Her other problems include hypertension and hyperlipidemia.   She called to be seen today for chest pain, shortness of breath and near syncope.   Since her last visit with Dr. Allyson Sabal she tells me she started having chest pains shortly thereafter.  It is occasional occurrence but has progressed significantly this past month. This week she is having episodes daily sometimes twice a day and the chest pressure she has experienced has wakened her from sleep as well.  She does not take nitroglycerin as it has not helped her with angina previously.  She cannot walk across the room without being significantly shorter breath, walking in from the waiting room it was obviously she had distress.  When the shortness of breath she has chest pressure.  After she rests it subsides.  She is on Imdur 120 daily, metoprolol XL 50 mg, and when she was on higher doses of beta blocker she has developed bradycardia in the past.  She also states she becomes lightheaded and feels like she will pass out. Her EKG today continues initial fibrillation with no acute  changes.  Due to the significance of her angina with increased frequency,  we'll admit to step down today to rule out MI, treat with IV Lasix, begin IV nitroglycerin and plan for cardiac catheterization tomorrow      Past Medical History  Diagnosis Date  . Hx of cholecystectomy t  . S/P hysterectomy t  . S/P lumbar fusion t  . Hypertension   . Atrial fib/flutter, transient     2D Echo 07/06/2010  EF of 60 to 65%   . SOB (shortness of breath) 03/14/2011  . CAD (coronary artery disease) 03/14/2011  . PAF (paroxysmal atrial fibrillation) 03/14/2011  . Anticoagulated 03/14/2011  . Infarction splenic 03/14/2011  . Bradycardia 03/14/2011  . GERD (gastroesophageal reflux disease)   . Hiatal hernia   . Heart murmur   . Arthritis   . Thyroid disease   . CVA (cerebral vascular accident)     02/28/2010  . Bleeding 12/12    rectal  . COPD (chronic obstructive pulmonary disease)     on xray  . Hyperlipidemia, mixed   . Carotid artery disease     Carotid duplex doppler 06/04/11    Past Surgical History  Procedure Laterality Date  . Coronary artery bypass graft      CABG x 4  05/28/96  Dr Donata Clay  (left internal mammary artery to the left anterior descending saphenous vein graft to the diagonal, saphenous vein graft to obtuse marginal, saphenous vein graft to posterior descending  .  Bladder suspension    . Cataract extraction w/ intraocular lens  implant, bilateral    . Tee without cardioversion  03/16/2011    Procedure: TRANSESOPHAGEAL ECHOCARDIOGRAM (TEE);  Surgeon: Chrystie Nose;  Location: [facility] ENDOSCOPY;  Service: Cardiovascular;  Laterality: N/A;  . Colonoscopy  03/17/2011    Procedure: COLONOSCOPY;  Surgeon: Barrie Folk, MD;  Location: [facility] ENDOSCOPY;  Service: Endoscopy;  Laterality: N/A;  . Cholecystectomy    . Back surgery    . Abdominal hysterectomy  1974  . Eye surgery      both cataracts  . Cardiac catheterization      05/24/1996/Dr Riley Kill  revealed 3 vessel coronary artery  disease,90-95% LAD, 70-80% mid LAD, 70% diagonal, 80% circumflex, 60-7-% circumflex lesions, 60% proximal RCA as well as 50-70, 60% distal RCA, 90% proximal  PDA with normal LV function;  03/10/1999, 12/04/1998, 05/01/2002, 06/18/2003, 08/08/2008, 12/20/11, 07/21/10;     Family History  Problem Relation Age of Onset  . Cancer Mother     colon  . Cancer Son     colon   Social History:  reports that she has never smoked. She has never used smokeless tobacco. She reports that she does not drink alcohol or use illicit drugs.  Allergies:  Allergies  Allergen Reactions  . Celebrex [Celecoxib] Other (See Comments)    Unknown   . Ciprofloxacin Hcl Other (See Comments)    unknown  . Codeine Other (See Comments)    unknown  . Doxycycline Other (See Comments)    unknown  . Famotidine Other (See Comments)    unknown  . Levofloxacin Other (See Comments)    unknown  . Macrolides And Ketolides Other (See Comments)    unknown  . Penicillins Other (See Comments)    Unknown; possible cross reactor with eggs.   . Plavix [Clopidogrel Bisulfate] Other (See Comments)    Unknown   . Warfarin Sodium Other (See Comments)    Makes my skin bleed and also caused internal bleeding  . Amlodipine Rash    Medications Prior to Admission  Medication Sig Dispense Refill  . Bioflavonoid Products (ESTER C PO) Take 500 mg by mouth daily.      . cholecalciferol (VITAMIN D) 1000 UNITS tablet Take 2,000 Units by mouth daily.        Marland Kitchen diltiazem (CARDIZEM CD) 180 MG 24 hr capsule Take 180 mg by mouth daily.      Marland Kitchen ezetimibe (ZETIA) 10 MG tablet Take 10 mg by mouth daily.        . fluvastatin XL (LESCOL XL) 80 MG 24 hr tablet Take 80 mg by mouth daily.        . furosemide (LASIX) 40 MG tablet Take 40mg  (1 tab) every other day. Alternate with 20 mg (1/2 tab) every other day.  10 tablet  0  . isosorbide mononitrate (IMDUR) 120 MG 24 hr tablet Take 1 tablet (120 mg total) by mouth daily.  90 tablet  3  . metoprolol  (TOPROL-XL) 50 MG 24 hr tablet Take 50 mg by mouth daily.       . Multiple Vitamin (MULITIVITAMIN WITH MINERALS) TABS Take 1 tablet by mouth daily.      . nitroGLYCERIN (NITROSTAT) 0.4 MG SL tablet Place 1 tablet (0.4 mg total) under the tongue every 5 (five) minutes x 3 doses as needed for chest pain.  25 tablet  1  . pantoprazole (PROTONIX) 40 MG tablet Take 1 tablet (40 mg total) by mouth daily.  90 tablet  3  . polyethylene glycol (MIRALAX / GLYCOLAX) packet Take 17 g by mouth daily.        . Rivaroxaban (XARELTO) 15 MG TABS tablet Take 15 mg by mouth daily.       Marland Kitchen zolpidem (AMBIEN) 10 MG tablet Take 10 mg by mouth at bedtime.          No results found for this or any previous visit (from the past 48 hour(s)). No results found.  ROS: General:no colds or fevers, no weight changes Skin:no rashes or ulcers HEENT:no blurred vision, no congestion CV:see HPI PUL:see HPI GI:no diarrhea constipation or melena, no indigestion GU:no hematuria, no dysuria MS:no joint pain, no claudication Neuro:near syncope, + lightheadedness Endo:no diabetes, no thyroid disease    BP: 162/82 P63 Ht 5'2" wt 115  BMI 21.03 PE: General:Pleasant affect, NAD Skin:Warm and dry, brisk capillary refill HEENT:normocephalic, sclera clear, mucus membranes moist Neck:supple, + 2 cm JVD  flat, no bruits  Heart:irreg irreg with 2/6 systolic  murmur, gallup, rub or click Lungs:diminished without rales, rhonchi, or wheezes ZOX:WRUE, non tender, + BS, do not palpate liver spleen or masses Ext:no lower ext edema, 2+ pedal pulses, 2+ radial pulses Neuro:alert and oriented, MAE, follows commands, + facial symmetry    Assessment/Plan Principal Problem:   Angina pectoris, crescendo Active Problems:   CAD, CABG 1998, cath March 2012- medical Rx., cath 12/2011 medical therapy   Chronic atrial fibrillation - AA thrombus   Anticoagulated, Xarelto   Bradycardia in the past   DOE (dyspnea on exertion)   Syncope,  near   PLAN: Admit to step down, serial cardiac enzymes, IV heparin per pharmacy her last dose of xarelto anticoagulation was 02/27/2013.  Will check her BNP chest x-ray and give 40 mg IV Lasix x1.  Plan will be for cardiac catheterization tomorrow with Dr. Tresa Endo unless other issues arise in the meantime. Currently she scheduled for 1 PM.  I'm adding IV nitroglycerin drip as well as mild increase in her Toprol and adding Ranexa 500 mg twice a day.  Dr. Tresa Endo has seen and examined her.  Addendum:  On arrival to stepdown pt's HR was in the 40's -I changed toprol to lopressor 12.5 BID and changed Cardizem to short acting, and twice a day.  This may be why she had near syncope episodes.  [facility] R Nurse Practitioner Certified [facility] Pager [phone] 02/28/2013, 5:11 PM    Patient seen and examined. Agree with assessment and plan.  Very pleasant 79 yo WF s/p prior CABGin 1998. She presented to the office today with progressive accelerated episodes of recurrent chest pain and exertional sob suggestive of at least class 3 angina. She has permanent AF and took her last dose of Xarelto yesterday. We have admitted to hospital for USAP. Will add Ranolazine to regimen. Discussed definitive cardiac catheterization for probable tomorrow if anticoagulation will allow.    [name], MD, [facility] 02/28/2013 5:47 PM

## 2013-02-28 NOTE — Assessment & Plan Note (Signed)
On Xarelto 

## 2013-02-28 NOTE — Progress Notes (Signed)
Pt here for increasing chest pain and SOB, DOE, near syncope with known CAD.  Mild CHF as well. Will admit to stepdown and rule out.  Please see H&P for complete details.

## 2013-02-28 NOTE — Progress Notes (Addendum)
ANTICOAGULATION CONSULT NOTE - Initial Consult  Pharmacy Consult for Heparin Indication: chest pain/ACS, on Xarelto PTA  Allergies  Allergen Reactions  . Celebrex [Celecoxib] Other (See Comments)    Unknown   . Ciprofloxacin Hcl Other (See Comments)    unknown  . Codeine Other (See Comments)    unknown  . Doxycycline Other (See Comments)    unknown  . Famotidine Other (See Comments)    unknown  . Levofloxacin Other (See Comments)    unknown  . Macrolides And Ketolides Other (See Comments)    unknown  . Penicillins Other (See Comments)    Unknown; possible cross reactor with eggs.   . Plavix [Clopidogrel Bisulfate] Other (See Comments)    Unknown   . Warfarin Sodium Other (See Comments)    Makes my skin bleed and also caused internal bleeding  . Amlodipine Rash    Patient Measurements: Weight = 52 kg  Medical History: Past Medical History  Diagnosis Date  . Hx of cholecystectomy t  . S/P hysterectomy t  . S/P lumbar fusion t  . Hypertension   . Atrial fib/flutter, transient     2D Echo 07/06/2010  EF of 60 to 65%   . SOB (shortness of breath) 03/14/2011  . CAD (coronary artery disease) 03/14/2011  . PAF (paroxysmal atrial fibrillation) 03/14/2011  . Anticoagulated 03/14/2011  . Infarction splenic 03/14/2011  . Bradycardia 03/14/2011  . GERD (gastroesophageal reflux disease)   . Hiatal hernia   . Heart murmur   . Arthritis   . Thyroid disease   . CVA (cerebral vascular accident)     02/28/2010  . Bleeding 12/12    rectal  . COPD (chronic obstructive pulmonary disease)     on xray  . Hyperlipidemia, mixed   . Carotid artery disease     Carotid duplex doppler 06/04/11   Assessment: 79 year old female on Xarelto PTA for PAF admitted with CP and SOB.  Transitioning from Xarelto to heparin.  Last dose of Xarelto last PM, baseline heparin level = 1.82  Goal of Therapy:  Heparin level 0.3-0.7 units/ml Monitor platelets by anticoagulation protocol: Yes PTT goal =  66 to 102 seconds   Plan:  1) Heparin drip at 700 units / hr 2) Daily PTT, heparin level, CBC  Thank you. Okey Regal, PharmD [phone]  02/28/2013,4:42 PM

## 2013-02-28 NOTE — Telephone Encounter (Signed)
Returned call and pt verified x 2.  Pt stated she has gone to having spells closer together.  Pt c/o feeling light-headed, SOB, feeling like she is going to pass out and "a hurting" in her chest.  Stated she can't walk far and has to go to bed.  Stated she had episodes Sunday, Monday and yesterday.  Denied symptoms today.  Stated her "BP is out of rhythm."  BP was 114/49 HR 55 yesterday.  Pt checked BP while on phone and it was 131/70 HR 75.    Dr. Allyson Sabal notified and advised pt come in today w/ NP.  Pt informed and verbalized understanding.  Will call daughter to see when she can bring her in this afternoon.

## 2013-02-28 NOTE — Telephone Encounter (Signed)
Appt was scheduled for today at 2pm w/ NP.

## 2013-02-28 NOTE — Progress Notes (Signed)
Nurse contacted MD to inform that pt had arrived on floor, pt actively in A fib with heart rate fluctuations ranging from 40's-50's.  MD acknowledged nurse concerns and orders will follow.

## 2013-02-28 NOTE — Telephone Encounter (Signed)
Please call.  Says she is having "spells"  She gets where she cannot breathe, pressure pain in chest and sometimes fells like may pass out.  Says "spells" are getting closer together.  Please call

## 2013-03-01 ENCOUNTER — Ambulatory Visit (HOSPITAL_COMMUNITY): Admit: 2013-03-01 | Payer: Medicare Other | Admitting: Cardiovascular Disease

## 2013-03-01 ENCOUNTER — Encounter: Payer: Self-pay | Admitting: Cardiology

## 2013-03-01 DIAGNOSIS — I369 Nonrheumatic tricuspid valve disorder, unspecified: Secondary | ICD-10-CM

## 2013-03-01 LAB — CBC
Hemoglobin: 12.2 g/dL (ref 12.0–15.0)
MCH: 30.3 pg (ref 26.0–34.0)
MCHC: 34.1 g/dL (ref 30.0–36.0)
Platelets: 238 10*3/uL (ref 150–400)
RBC: 4.03 MIL/uL (ref 3.87–5.11)
RDW: 13.4 % (ref 11.5–15.5)

## 2013-03-01 LAB — LIPID PANEL
Cholesterol: 195 mg/dL (ref 0–200)
Total CHOL/HDL Ratio: 3.3 RATIO

## 2013-03-01 LAB — BASIC METABOLIC PANEL
CO2: 30 mEq/L (ref 19–32)
Calcium: 9.9 mg/dL (ref 8.4–10.5)
Creatinine, Ser: 0.96 mg/dL (ref 0.50–1.10)
GFR calc Af Amer: 59 mL/min — ABNORMAL LOW (ref >90)
GFR calc non Af Amer: 51 mL/min — ABNORMAL LOW (ref >90)
Sodium: 139 mEq/L (ref 135–145)

## 2013-03-01 LAB — PROTIME-INR: INR: 1.27 (ref 0.00–1.49)

## 2013-03-01 LAB — APTT: aPTT: 191 seconds — ABNORMAL HIGH (ref 24–37)

## 2013-03-01 LAB — TROPONIN I: Troponin I: <0.3 ng/mL (ref <0.30)

## 2013-03-01 LAB — HEPARIN LEVEL (UNFRACTIONATED): Heparin Unfractionated: 1.26 IU/mL — ABNORMAL HIGH (ref 0.30–0.70)

## 2013-03-01 MED ORDER — SODIUM CHLORIDE 0.9 % IV SOLN: INTRAVENOUS | Status: DC

## 2013-03-01 MED ORDER — HEPARIN (PORCINE) IN NACL 100-0.45 UNIT/ML-% IJ SOLN
700.0000 [IU]/h | INTRAMUSCULAR | Status: DC
  Administered 2013-03-01: 700 [IU]/h via INTRAVENOUS
  Filled 2013-03-01: qty 250

## 2013-03-01 MED ORDER — PRAVASTATIN SODIUM 40 MG PO TABS
80.0000 mg | ORAL_TABLET | Freq: Every day | ORAL | Status: DC
  Administered 2013-03-01 – 2013-03-02 (×2): 80 mg via ORAL
  Filled 2013-03-01 (×3): qty 2

## 2013-03-01 NOTE — Progress Notes (Signed)
Echo Lab  2D Echocardiogram completed.  Cathy Ortega, RDCS 03/01/2013 12:53 PM

## 2013-03-01 NOTE — Progress Notes (Signed)
Utilization review completed. Tarri Guilfoil, RN, BSN. 

## 2013-03-01 NOTE — Progress Notes (Addendum)
ANTICOAGULATION CONSULT NOTE - Follow Up Consult  Pharmacy Consult for heparin Indication: chest pain/ACS  Allergies  Allergen Reactions  . Celebrex [Celecoxib] Other (See Comments)    Unknown   . Ciprofloxacin Hcl Other (See Comments)    unknown  . Codeine Other (See Comments)    unknown  . Doxycycline Other (See Comments)    unknown  . Famotidine Other (See Comments)    unknown  . Levofloxacin Other (See Comments)    unknown  . Macrolides And Ketolides Other (See Comments)    unknown  . Penicillins Other (See Comments)    Unknown; possible cross reactor with eggs.   . Plavix [Clopidogrel Bisulfate] Other (See Comments)    Unknown   . Warfarin Sodium Other (See Comments)    Makes my skin bleed and also caused internal bleeding  . Amlodipine Rash    Patient Measurements: Height: 5\' 2"  (157.5 cm) Weight: 116 lb 13.5 oz (53 kg) IBW/kg (Calculated) : 50.1 Heparin Dosing Weight: 53kg  Vital Signs: Temp: 97.8 F (36.6 C) (11/20 1134) Temp src: Oral (11/20 1134) BP: 105/84 mmHg (11/20 1134) Pulse Rate: 60 (11/20 1134)  Labs:  Recent Labs  02/28/13 1800 02/28/13 2315 03/01/13 0526 03/01/13 0920  HGB 13.6  --  12.2  --   HCT 39.6  --  35.8*  --   PLT 277  --  238  --   APTT 32  --   --  191*  LABPROT 15.0  --   --  15.6*  INR 1.21  --   --  1.27  HEPARINUNFRC 1.82*  --   --  1.26*  CREATININE 0.83  --  0.96  --   CKTOTAL 67  --   --   --   CKMB 2.3  --   --   --   TROPONINI <0.30 <0.30 <0.30  --     Estimated Creatinine Clearance: 32 ml/min (by C-G formula based on Cr of 0.96).  Assessment: 28 YOF who was on Xarelto PTA- last dose 11/18 at ~1800. She was started on heparin last evening- initial HL was 1.82- can be falsely elevated by Xarelto, baseline aPTT 32. Recheck this morning resulted in HL of 1.26 and aPTT 191 which appear to be starting to correlate. Per Dr. Landry Dyke note and charted MAR, heparin was stopped around 1100 this morning in preparation  for cath.  Goal of Therapy:  [name] 0.3-0.7 units/ml aPTT 66-102 seconds Monitor platelets by anticoagulation protocol: Yes   Plan:  1. Continue to hold heparin until after cath as per cards 2. Follow up after cath for anticoagulation plans  Cathy Ortega, PharmD, BCPS Clinical Pharmacist Pager: [phone] 03/01/2013 11:41 AM  Addendum: 1478: Cath postponed till 0730 on 11/21. Pharmacy consulted to re-start heparin without bolus. Maintain goals as noted above.   Plan: 1) Re-start heparin at 700 units/hr 2) Stop heparin drip at 0400 on 11/21 per cardiology prior to AM cath 3) Monitor for s/s of bleeding 4) F/u anticoag plans after cath    Cathy Ortega, PharmD.   Clinical Pharmacist Pager [phone]

## 2013-03-01 NOTE — Progress Notes (Signed)
Subjective:  Had chest pain yesterday when walking to the bathroom. Pain free presently.  Objective:   Vital Signs in the last 24 hours: Temp:  [97.5 F (36.4 C)-97.9 F (36.6 C)] 97.9 F (36.6 C) (11/20 0812) Pulse Rate:  [45-63] 63 (11/20 0812) Resp:  [10-21] 17 (11/20 0812) BP: (106-162)/(46-91) 113/46 mmHg (11/20 0812) SpO2:  [95 %-99 %] 97 % (11/20 0812) Weight:  [114 lb 13.8 oz (52.1 kg)-117 lb 4.6 oz (53.2 kg)] 116 lb 13.5 oz (53 kg) (11/20 0439)  Intake/Output from previous day: 11/19 0701 - 11/20 0700 In: 133 [P.O.:120; I.V.:13] Out: -   Medications: . aspirin EC  81 mg Oral Daily  . cholecalciferol  2,000 Units Oral Daily  . diltiazem  60 mg Oral Q12H  . docusate sodium  100 mg Oral Q12H  . ezetimibe  10 mg Oral Daily  . metoprolol tartrate  12.5 mg Oral BID  . multivitamin with minerals  1 tablet Oral Daily  . omega-3 acid ethyl esters  1 g Oral Daily  . pantoprazole  40 mg Oral Daily  . polyethylene glycol  17 g Oral Daily  . polyvinyl alcohol  1 drop Both Eyes QHS  . pravastatin  80 mg Oral q1800  . ranolazine  500 mg Oral BID  . sodium chloride  3 mL Intravenous Q12H  . sucralfate  1 g Oral QID  . zolpidem  5 mg Oral QHS    . sodium chloride 1,000 mL (02/28/13 1746)  . sodium chloride 50 mL/hr at 03/01/13 0600  . heparin 700 Units/hr (03/01/13 0600)  . nitroGLYCERIN 5 mcg/min (03/01/13 0600)    Physical Exam:   General appearance: alert, appears stated age and no distress Neck: no JVD, supple, symmetrical, trachea midline and thyroid not enlarged, symmetric, no tenderness/mass/nodules Lungs: clear to auscultation bilaterally Heart: irregularly irregular rhythm and 1/6 sem Abdomen: soft, non-tender; bowel sounds normal; no masses,  no organomegaly Extremities: no edema, redness or tenderness in the calves or thighs Pulses: 2+ and symmetric   Rate: 60     Rhythm: atrial fibrillation  Lab Results:    Recent Labs  02/28/13 1800  03/01/13 0526  NA 139 139  K 4.1 3.6  CL 97 100  CO2 30 30  GLUCOSE 124* 91  BUN 11 14  CREATININE 0.83 0.96    Recent Labs  02/28/13 2315 03/01/13 0526  TROPONINI <0.30 <0.30   Hepatic Function Panel  Recent Labs  02/28/13 1800  PROT 7.7  ALBUMIN 4.2  AST 22  ALT 16  ALKPHOS 83  BILITOT 0.9    Recent Labs  02/28/13 1800  INR 1.21   BNP (last 3 results)  Recent Labs  02/28/13 1800  PROBNP 863.2*    Lipid Panel     Component Value Date/Time   CHOL 195 03/01/2013 0526   TRIG 80 03/01/2013 0526   HDL 59 03/01/2013 0526   CHOLHDL 3.3 03/01/2013 0526   VLDL 16 03/01/2013 0526   LDLCALC 120* 03/01/2013 0526      Imaging:  Dg Chest Port 1 View  02/28/2013   CLINICAL DATA:  Short of breath  EXAM: PORTABLE CHEST - 1 VIEW  COMPARISON:  10/09/2012  FINDINGS: Stable enlarged cardiac silhouette. No effusion, infiltrate, pneumothorax. Lungs are mildly hyperinflated. Midline sternotomy noted.  IMPRESSION: No acute cardiopulmonary process.   Electronically Signed   By: Genevive Bi M.D.   On: 02/28/2013 18:08      Assessment/Plan:   Principal  Problem:   Angina pectoris, crescendo Active Problems:   CAD, CABG 1998, cath March 2012- medical Rx., cath 12/2011 medical therapy   Chronic atrial fibrillation - AA thrombus   Anticoagulated, Xarelto   Bradycardia in the past   DOE (dyspnea on exertion)   Syncope, near  Pt had recurrent chest pain last night when walking to bathroom. Enzymes negative. Will review old cines. Last dose of xarelto was ~ 1800 on 11/18; will hold heparin, plan cath later today.    [name], MD, [facility] 03/01/2013, 8:30 AM

## 2013-03-02 ENCOUNTER — Encounter (HOSPITAL_COMMUNITY): Payer: Self-pay | Admitting: *Deleted

## 2013-03-02 ENCOUNTER — Encounter (HOSPITAL_COMMUNITY)
Admission: AD | Disposition: A | Discharge status: Home / Self Care | Payer: Self-pay | Source: Ambulatory Visit | Attending: Cardiovascular Disease

## 2013-03-02 DIAGNOSIS — I2510 Atherosclerotic heart disease of native coronary artery without angina pectoris: Principal | ICD-10-CM

## 2013-03-02 HISTORY — PX: LEFT HEART CATHETERIZATION WITH CORONARY/GRAFT ANGIOGRAM: SHX5450

## 2013-03-02 LAB — PROTIME-INR
INR: 1.05 (ref 0.00–1.49)
Prothrombin Time: 13.5 seconds (ref 11.6–15.2)

## 2013-03-02 LAB — CBC
HCT: 37.2 % (ref 36.0–46.0)
Hemoglobin: 12.6 g/dL (ref 12.0–15.0)
MCH: 30.1 pg (ref 26.0–34.0)
MCV: 89 fL (ref 78.0–100.0)
Platelets: 227 10*3/uL (ref 150–400)
RBC: 4.18 MIL/uL (ref 3.87–5.11)
WBC: 5.6 10*3/uL (ref 4.0–10.5)

## 2013-03-02 LAB — HEPARIN LEVEL (UNFRACTIONATED)
Heparin Unfractionated: 0.63 IU/mL (ref 0.30–0.70)
Heparin Unfractionated: 0.38 IU/mL (ref 0.30–0.70)

## 2013-03-02 LAB — APTT: aPTT: 58 seconds — ABNORMAL HIGH (ref 24–37)

## 2013-03-02 SURGERY — LEFT HEART CATHETERIZATION WITH CORONARY/GRAFT ANGIOGRAM
Anesthesia: LOCAL

## 2013-03-02 MED ORDER — LIDOCAINE HCL (PF) 1 % IJ SOLN
INTRAMUSCULAR | Status: AC
  Filled 2013-03-02: qty 30

## 2013-03-02 MED ORDER — ASPIRIN 81 MG PO CHEW: 81.0000 mg | CHEWABLE_TABLET | ORAL | Status: DC

## 2013-03-02 MED ORDER — SODIUM CHLORIDE 0.9 % IJ SOLN
3.0000 mL | Freq: Two times a day (BID) | INTRAMUSCULAR | Status: DC
  Administered 2013-03-02: 3 mL via INTRAVENOUS

## 2013-03-02 MED ORDER — ONDANSETRON HCL 4 MG/2ML IJ SOLN: 4.0000 mg | Freq: Four times a day (QID) | INTRAMUSCULAR | Status: DC | PRN

## 2013-03-02 MED ORDER — ACETAMINOPHEN 325 MG PO TABS: 650.0000 mg | ORAL_TABLET | ORAL | Status: DC | PRN

## 2013-03-02 MED ORDER — SODIUM CHLORIDE 0.9 % IV SOLN: 250.0000 mL | INTRAVENOUS | Status: DC | PRN

## 2013-03-02 MED ORDER — HYDRALAZINE HCL 20 MG/ML IJ SOLN
INTRAMUSCULAR | Status: AC
  Filled 2013-03-02: qty 1

## 2013-03-02 MED ORDER — MORPHINE SULFATE 2 MG/ML IJ SOLN: 1.0000 mg | INTRAMUSCULAR | Status: DC | PRN

## 2013-03-02 MED ORDER — SODIUM CHLORIDE 0.9 % IJ SOLN: 3.0000 mL | Freq: Two times a day (BID) | INTRAMUSCULAR | Status: DC

## 2013-03-02 MED ORDER — HYDRALAZINE HCL 20 MG/ML IJ SOLN: 10.0000 mg | INTRAMUSCULAR | Status: DC | PRN

## 2013-03-02 MED ORDER — SODIUM CHLORIDE 0.9 % IV SOLN: INTRAVENOUS | Status: AC

## 2013-03-02 MED ORDER — NITROGLYCERIN 0.2 MG/ML ON CALL CATH LAB
INTRAVENOUS | Status: AC
  Filled 2013-03-02: qty 1

## 2013-03-02 MED ORDER — HEPARIN (PORCINE) IN NACL 100-0.45 UNIT/ML-% IJ SOLN
600.0000 [IU]/h | INTRAMUSCULAR | Status: AC
  Administered 2013-03-02: 600 [IU]/h via INTRAVENOUS
  Filled 2013-03-02: qty 250

## 2013-03-02 MED ORDER — HEPARIN (PORCINE) IN NACL 2-0.9 UNIT/ML-% IJ SOLN
INTRAMUSCULAR | Status: AC
  Filled 2013-03-02: qty 1000

## 2013-03-02 MED ORDER — SODIUM CHLORIDE 0.9 % IJ SOLN: 3.0000 mL | INTRAMUSCULAR | Status: DC | PRN

## 2013-03-02 NOTE — Interval H&P Note (Signed)
Cath Lab Visit (complete for each Cath Lab visit)  Clinical Evaluation Leading to the Procedure:   ACS: yes  Non-ACS:    Anginal Classification: CCS IV  Anti-ischemic medical therapy: Maximal Therapy (2 or more classes of medications)  Non-Invasive Test Results: No non-invasive testing performed  Prior CABG: Previous CABG      History and Physical Interval Note:  03/02/2013 7:43 AM  Cathy Ortega  has presented today for surgery, with the diagnosis of Chest pain  The various methods of treatment have been discussed with the patient and family. After consideration of risks, benefits and other options for treatment, the patient has consented to  Procedure(s): LEFT HEART CATHETERIZATION WITH CORONARY/GRAFT ANGIOGRAM (N/A) as a surgical intervention .  The patient's history has been reviewed, patient examined, no change in status, stable for surgery.  I have reviewed the patient's chart and labs.  Questions were answered to the patient's satisfaction.     [name]

## 2013-03-02 NOTE — H&P (View-Only) (Signed)
 Subjective:  Had chest pain yesterday when walking to the bathroom. Pain free presently.  Objective:   Vital Signs in the last 24 hours: Temp:  [97.5 F (36.4 C)-97.9 F (36.6 C)] 97.9 F (36.6 C) (11/20 0812) Pulse Rate:  [45-63] 63 (11/20 0812) Resp:  [10-21] 17 (11/20 0812) BP: (106-162)/(46-91) 113/46 mmHg (11/20 0812) SpO2:  [95 %-99 %] 97 % (11/20 0812) Weight:  [114 lb 13.8 oz (52.1 kg)-117 lb 4.6 oz (53.2 kg)] 116 lb 13.5 oz (53 kg) (11/20 0439)  Intake/Output from previous day: 11/19 0701 - 11/20 0700 In: 133 [P.O.:120; I.V.:13] Out: -   Medications: . aspirin EC  81 mg Oral Daily  . cholecalciferol  2,000 Units Oral Daily  . diltiazem  60 mg Oral Q12H  . docusate sodium  100 mg Oral Q12H  . ezetimibe  10 mg Oral Daily  . metoprolol tartrate  12.5 mg Oral BID  . multivitamin with minerals  1 tablet Oral Daily  . omega-3 acid ethyl esters  1 g Oral Daily  . pantoprazole  40 mg Oral Daily  . polyethylene glycol  17 g Oral Daily  . polyvinyl alcohol  1 drop Both Eyes QHS  . pravastatin  80 mg Oral q1800  . ranolazine  500 mg Oral BID  . sodium chloride  3 mL Intravenous Q12H  . sucralfate  1 g Oral QID  . zolpidem  5 mg Oral QHS    . sodium chloride 1,000 mL (02/28/13 1746)  . sodium chloride 50 mL/hr at 03/01/13 0600  . heparin 700 Units/hr (03/01/13 0600)  . nitroGLYCERIN 5 mcg/min (03/01/13 0600)    Physical Exam:   General appearance: alert, appears stated age and no distress Neck: no JVD, supple, symmetrical, trachea midline and thyroid not enlarged, symmetric, no tenderness/mass/nodules Lungs: clear to auscultation bilaterally Heart: irregularly irregular rhythm and 1/6 sem Abdomen: soft, non-tender; bowel sounds normal; no masses,  no organomegaly Extremities: no edema, redness or tenderness in the calves or thighs Pulses: 2+ and symmetric   Rate: 60     Rhythm: atrial fibrillation  Lab Results:    Recent Labs  02/28/13 1800  03/01/13 0526  NA 139 139  K 4.1 3.6  CL 97 100  CO2 30 30  GLUCOSE 124* 91  BUN 11 14  CREATININE 0.83 0.96    Recent Labs  02/28/13 2315 03/01/13 0526  TROPONINI <0.30 <0.30   Hepatic Function Panel  Recent Labs  02/28/13 1800  PROT 7.7  ALBUMIN 4.2  AST 22  ALT 16  ALKPHOS 83  BILITOT 0.9    Recent Labs  02/28/13 1800  INR 1.21   BNP (last 3 results)  Recent Labs  02/28/13 1800  PROBNP 863.2*    Lipid Panel     Component Value Date/Time   CHOL 195 03/01/2013 0526   TRIG 80 03/01/2013 0526   HDL 59 03/01/2013 0526   CHOLHDL 3.3 03/01/2013 0526   VLDL 16 03/01/2013 0526   LDLCALC 120* 03/01/2013 0526      Imaging:  Dg Chest Port 1 View  02/28/2013   CLINICAL DATA:  Short of breath  EXAM: PORTABLE CHEST - 1 VIEW  COMPARISON:  10/09/2012  FINDINGS: Stable enlarged cardiac silhouette. No effusion, infiltrate, pneumothorax. Lungs are mildly hyperinflated. Midline sternotomy noted.  IMPRESSION: No acute cardiopulmonary process.   Electronically Signed   By: Stewart  Edmunds M.D.   On: 02/28/2013 18:08      Assessment/Plan:   Principal   Problem:   Angina pectoris, crescendo Active Problems:   CAD, CABG 1998, cath March 2012- medical Rx., cath 12/2011 medical therapy   Chronic atrial fibrillation - AA thrombus   Anticoagulated, Xarelto   Bradycardia in the past   DOE (dyspnea on exertion)   Syncope, near  Pt had recurrent chest pain last night when walking to bathroom. Enzymes negative. Will review old cines. Last dose of xarelto was ~ 1800 on 11/18; will hold heparin, plan cath later today.    Jalynne Persico A. Dewey Viens, MD, FACC 03/01/2013, 8:30 AM 

## 2013-03-02 NOTE — Progress Notes (Signed)
ANTICOAGULATION CONSULT NOTE - Follow Up Consult  Pharmacy Consult for heparin Indication: atrial fibrillation  Labs:  Recent Labs  02/28/13 1800 02/28/13 2315 03/01/13 0526 03/01/13 0920 03/02/13 0505 03/02/13 2243  HGB 13.6  --  12.2  --  12.6  --   HCT 39.6  --  35.8*  --  37.2  --   PLT 277  --  238  --  227  --   APTT 32  --   --  191*  --  58*  LABPROT 15.0  --   --  15.6* 13.5  --   INR 1.21  --   --  1.27 1.05  --   HEPARINUNFRC 1.82*  --   --  1.26* 0.63 0.38  CREATININE 0.83  --  0.96  --   --   --   CKTOTAL 67  --   --   --   --   --   CKMB 2.3  --   --   --   --   --   TROPONINI <0.30 <0.30 <0.30  --   --   --     Assessment/Plan:  79yo female therapeutic on heparin after resumed post-cath.  Planned for myoview in am and if normal to resume Xarelto.  Will continue heparin gtt for now and confirm stable with am labs.  Vernard Gambles, PharmD, BCPS  03/02/2013,11:34 PM

## 2013-03-02 NOTE — CV Procedure (Signed)
Cathy Ortega is a 79 y.o. female    [id number] LOCATION:  FACILITY: [facility]  PHYSICIAN: Cathy Ortega, M.D. February 25, 1923   DATE OF PROCEDURE:  03/02/2013  DATE OF DISCHARGE:     CARDIAC CATHETERIZATION     History obtained from chart review.The patient is an 79 year old thin and frail appearing widowed Caucasian female mother of 3 who is accompanied by one of her daughters today. She was last seen by Dr. Allyson Ortega 2 months ago. She has a history of CAD status post coronary artery bypass grafting in 1998. She was catheterized in 2000, 2005, 2010, 2012 and most recently December 20, 2011 revealing intact vein grafts, an atretic LIMA with normal LV function and 2+ MR. She did have a 95% LAD lesion beyond an occluded proximal LAD which was filled retrograde by a diagonal branch vein graft. She has chronic atrial fibrillation on Xarelto. She had unsuccessful attempts at cardioversion because of residual "smoke" in her left atrial appendage. Her other problems include hypertension and hyperlipidemia.  She called to be seen today for chest pain, shortness of breath and near syncope. Since her last visit with Dr. Allyson Ortega she tells me she started having chest pains shortly thereafter. It is occasional occurrence but has progressed significantly this past month. This week she is having episodes daily sometimes twice a day and the chest pressure she has experienced has wakened her from sleep as well. She does not take nitroglycerin as it has not helped her with angina previously. She cannot walk across the room without being significantly shorter breath, walking in from the waiting room it was obviously she had distress. When the shortness of breath she has chest pressure. After she rests it subsides. She is on Imdur 120 daily, metoprolol XL 50 mg, and when she was on higher doses of beta blocker she has developed bradycardia in the past. She also states she becomes lightheaded and feels like she will pass out.    Her EKG today continues initial fibrillation with no acute changes. Due to the significance of her angina with increased frequency, we'll admit to step down today to rule out MI, treat with IV Lasix, begin IV nitroglycerin and plan for cardiac catheterization today.    PROCEDURE DESCRIPTION:   The patient was brought to the second floor [facility] cath lab in the postabsorptive state. She was not premedicated . Her right groinwas prepped and shaved in usual sterile fashion. Xylocaine 1% was used for local anesthesia. A 5 French sheath was inserted into the right common femoral artery using standard Seldinger technique.5 French right and left Judkins diagnostic catheters along with a 5 French pigtail catheter were used for selective coronary angiography, selective vein graft angiography and left ventriculography. Visipaque dye was used for the entirety of the case. Retrograde aortic Cohen particular and pullback pressures were recorded.   HEMODYNAMICS:    AO SYSTOLIC/AO DIASTOLIC: 181/80   LV SYSTOLIC/LV DIASTOLIC: 181/19  ANGIOGRAPHIC RESULTS:   1. Left main; normal  2. LAD; occluded proximally after the first septal perforator 3. Left circumflex; nondominant with a 90% AV groove circumflex stenosis in the midportion, occluded distally. This is a fluoroscopically highly calcified vessel.  4. Right coronary artery; dominant with a 95% calcified stenosis in the midportion followed by occlusion at the genu 5.LIMA TO LAD; document be atretic at previous cath 6. SVG TO PDA was patent. It is a small-caliber vein graft with 70% segmental proximal stenosis damping using a 5 French catheter. This appeared  similar to her prior cath     SVG TO distal circumflex OM was widely patent     SVG TO diagonal branch was widely patent which filled the LAD retrograde. There was a 70% hypodense lesion in the LAD just beyond the diagonal branch takeoff however this did not appear to be different compared  to her prior cath 7. Left ventriculography; RAO left ventriculogram was performed using  25 mL of Visipaque dye at 12 mL/second. The overall LVEF estimated  65 %  Without wall motion abnormalities  IMPRESSION:Mr. Cathy Ortega had patent vein grafts with anatomy that is similar to her prior cath for all intents and purposes. She does have a small vein graft to the PDA 70% segmental proximal stenosis, patent vein graft to the OM branch and to the diagonal branch which filled the LAD retrograde. There is a stenosis in the LAD beyond the diagonal branch takeoff this is a prior cath as well. Her LIMA is known to be atretic. She has normal LV function and going to restart heparin and 6 hours without a bolus and will perform a Lexi scan Myoview stress test on her tomorrow morning. If this is normal will restart her Xarelto  oral anticoagulation in anticipation of discharge.  Cathy Gess MD, [facility] 03/02/2013 8:21 AM

## 2013-03-02 NOTE — Progress Notes (Signed)
ANTICOAGULATION CONSULT NOTE - Follow Up Consult  Pharmacy Consult for heparin Indication: chest pain/ACS  Allergies  Allergen Reactions  . Celebrex [Celecoxib] Other (See Comments)    Unknown   . Ciprofloxacin Hcl Other (See Comments)    unknown  . Codeine Other (See Comments)    unknown  . Doxycycline Other (See Comments)    unknown  . Famotidine Other (See Comments)    unknown  . Levofloxacin Other (See Comments)    unknown  . Macrolides And Ketolides Other (See Comments)    unknown  . Penicillins Other (See Comments)    Unknown; possible cross reactor with eggs.   . Plavix [Clopidogrel Bisulfate] Other (See Comments)    Unknown   . Warfarin Sodium Other (See Comments)    Makes my skin bleed and also caused internal bleeding  . Amlodipine Rash    Patient Measurements: Height: 5\' 2"  (157.5 cm) Weight: 114 lb 6.7 oz (51.9 kg) IBW/kg (Calculated) : 50.1 Heparin Dosing Weight: 51.9 kg  Vital Signs: Temp: 97.6 F (36.4 C) (11/21 0910) Temp src: Oral (11/21 0910) BP: 130/42 mmHg (11/21 0910) Pulse Rate: 80 (11/21 0910)  Labs:  Recent Labs  02/28/13 1800 02/28/13 2315 03/01/13 0526 03/01/13 0920 03/02/13 0505  HGB 13.6  --  12.2  --  12.6  HCT 39.6  --  35.8*  --  37.2  PLT 277  --  238  --  227  APTT 32  --   --  191*  --   LABPROT 15.0  --   --  15.6* 13.5  INR 1.21  --   --  1.27 1.05  HEPARINUNFRC 1.82*  --   --  1.26* 0.63  CREATININE 0.83  --  0.96  --   --   CKTOTAL 67  --   --   --   --   CKMB 2.3  --   --   --   --   TROPONINI <0.30 <0.30 <0.30  --   --     Estimated Creatinine Clearance: 32 ml/min (by C-G formula based on Cr of 0.96).  Assessment: 89 YOF on IV heparin, she was on Xarelto PTA- last dose 11/18 at ~1800. S/p cath this morning, the plan is to resume heparin 6 hrs after sheath removal, and myoview tomorrow. If negative will change back to xarelto.  Heparin was restarted last night at 1800 with the plan to stop at 0400 before  cath. Heparin level 0.63, but drawn at 5am. Hgb 12.6, plt 227, sheath removal time 0825.   Goal of Therapy:  Heparin level 0.3-0.7 units/ml aPTT 66-102 seconds Monitor platelets by anticoagulation protocol: Yes   Plan:  - Restart heparin infusion 600 units/hr at 1430 with no bolus - f/u heparin level and aPTT at 2230 - f/u daily heparin level, CBC and aPTT - f/u plans after stress test tomorrow.  Bayard Hugger, PharmD, BCPS  Clinical Pharmacist  Pager: [phone]

## 2013-03-03 ENCOUNTER — Other Ambulatory Visit: Payer: Self-pay | Admitting: Cardiology

## 2013-03-03 ENCOUNTER — Other Ambulatory Visit (HOSPITAL_COMMUNITY): Payer: Medicare Other

## 2013-03-03 ENCOUNTER — Inpatient Hospital Stay (HOSPITAL_COMMUNITY): Payer: Medicare Other

## 2013-03-03 DIAGNOSIS — I4891 Unspecified atrial fibrillation: Secondary | ICD-10-CM

## 2013-03-03 DIAGNOSIS — I200 Unstable angina: Secondary | ICD-10-CM

## 2013-03-03 DIAGNOSIS — R001 Bradycardia, unspecified: Secondary | ICD-10-CM

## 2013-03-03 DIAGNOSIS — R55 Syncope and collapse: Secondary | ICD-10-CM

## 2013-03-03 DIAGNOSIS — I2510 Atherosclerotic heart disease of native coronary artery without angina pectoris: Secondary | ICD-10-CM

## 2013-03-03 DIAGNOSIS — R079 Chest pain, unspecified: Secondary | ICD-10-CM

## 2013-03-03 DIAGNOSIS — R0602 Shortness of breath: Secondary | ICD-10-CM

## 2013-03-03 LAB — CBC
Hemoglobin: 12.4 g/dL (ref 12.0–15.0)
MCHC: 34.7 g/dL (ref 30.0–36.0)
Platelets: 218 10*3/uL (ref 150–400)
RDW: 13.6 % (ref 11.5–15.5)

## 2013-03-03 LAB — HEPARIN LEVEL (UNFRACTIONATED): Heparin Unfractionated: 0.48 IU/mL (ref 0.30–0.70)

## 2013-03-03 MED ORDER — OMEGA-3-ACID ETHYL ESTERS 1 G PO CAPS: 1.0000 g | ORAL_CAPSULE | Freq: Every day | ORAL | Status: DC

## 2013-03-03 MED ORDER — RIVAROXABAN 15 MG PO TABS
15.0000 mg | ORAL_TABLET | Freq: Every day | ORAL | Status: DC
  Filled 2013-03-03: qty 1

## 2013-03-03 MED ORDER — POLYVINYL ALCOHOL 1.4 % OP SOLN: 1.0000 [drp] | Freq: Every day | OPHTHALMIC | Status: AC

## 2013-03-03 MED ORDER — REGADENOSON 0.4 MG/5ML IV SOLN
INTRAVENOUS | Status: AC
  Filled 2013-03-03: qty 5

## 2013-03-03 MED ORDER — TECHNETIUM TC 99M SESTAMIBI - CARDIOLITE
10.0000 | Freq: Once | INTRAVENOUS | Status: AC | PRN
  Administered 2013-03-03: 09:00:00 10 via INTRAVENOUS

## 2013-03-03 MED ORDER — ASPIRIN 81 MG PO TBEC: 81.0000 mg | DELAYED_RELEASE_TABLET | Freq: Every day | ORAL | Status: AC

## 2013-03-03 MED ORDER — DILTIAZEM HCL 60 MG PO TABS: 60.0000 mg | ORAL_TABLET | Freq: Two times a day (BID) | ORAL | Status: DC

## 2013-03-03 MED ORDER — RANOLAZINE ER 500 MG PO TB12: 500.0000 mg | ORAL_TABLET | Freq: Two times a day (BID) | ORAL | Status: DC

## 2013-03-03 MED ORDER — DSS 100 MG PO CAPS: 100.0000 mg | ORAL_CAPSULE | Freq: Two times a day (BID) | ORAL | Status: AC

## 2013-03-03 MED ORDER — METOPROLOL TARTRATE 25 MG PO TABS: 12.5000 mg | ORAL_TABLET | Freq: Two times a day (BID) | ORAL | Status: DC

## 2013-03-03 MED ORDER — REGADENOSON 0.4 MG/5ML IV SOLN
0.4000 mg | Freq: Once | INTRAVENOUS | Status: AC
  Administered 2013-03-03: 10:00:00 0.4 mg via INTRAVENOUS

## 2013-03-03 MED ORDER — TECHNETIUM TC 99M SESTAMIBI - CARDIOLITE
30.0000 | Freq: Once | INTRAVENOUS | Status: AC | PRN
  Administered 2013-03-03: 10:00:00 30 via INTRAVENOUS

## 2013-03-03 MED ORDER — RIVAROXABAN 15 MG PO TABS
15.0000 mg | ORAL_TABLET | Freq: Every day | ORAL | Status: DC
  Administered 2013-03-03: 15 mg via ORAL
  Filled 2013-03-03: qty 1

## 2013-03-03 NOTE — Progress Notes (Signed)
Subjective:  No further CP  Objective:  Temp:  [97.7 F (36.5 C)-98.1 F (36.7 C)] 97.7 F (36.5 C) (11/22 0738) Pulse Rate:  [65-98] 85 (11/22 1018) Resp:  [18-20] 18 (11/22 0738) BP: (108-154)/(53-81) 138/57 mmHg (11/22 1018) SpO2:  [94 %-97 %] 94 % (11/22 0738) Weight:  [116 lb 10 oz (52.9 kg)] 116 lb 10 oz (52.9 kg) (11/22 0023) Weight change: 2 lb 3.3 oz (1 kg)  Intake/Output from previous day: 11/21 0701 - 11/22 0700 In: 677 [P.O.:560; I.V.:117] Out: 1300 [Urine:1300]  Intake/Output from this shift:    Physical Exam: General appearance: alert and no distress Neck: no adenopathy, no carotid bruit, no JVD, supple, symmetrical, trachea midline and thyroid not enlarged, symmetric, no tenderness/mass/nodules Lungs: clear to auscultation bilaterally Heart: irregularly irregular rhythm Extremities: extremities normal, atraumatic, no cyanosis or edema and Groin OK  Lab Results: Results for orders placed during the hospital encounter of 02/28/13 (from the past 48 hour(s))  CBC     Status: None   Collection Time    03/02/13  5:05 AM      Result Value Range   WBC 5.6  4.0 - 10.5 K/uL   RBC 4.18  3.87 - 5.11 MIL/uL   Hemoglobin 12.6  12.0 - 15.0 g/dL   HCT 16.1  09.6 - 04.5 %   MCV 89.0  78.0 - 100.0 fL   MCH 30.1  26.0 - 34.0 pg   MCHC 33.9  30.0 - 36.0 g/dL   RDW 40.9  81.1 - 91.4 %   Platelets 227  150 - 400 K/uL  HEPARIN LEVEL (UNFRACTIONATED)     Status: None   Collection Time    03/02/13  5:05 AM      Result Value Range   Heparin Unfractionated 0.63  0.30 - 0.70 IU/mL   Comment:            IF HEPARIN RESULTS ARE BELOW     EXPECTED VALUES, AND PATIENT     DOSAGE HAS BEEN CONFIRMED,     SUGGEST FOLLOW UP TESTING     OF ANTITHROMBIN III LEVELS.  PROTIME-INR     Status: None   Collection Time    03/02/13  5:05 AM      Result Value Range   Prothrombin Time 13.5  11.6 - 15.2 seconds   INR 1.05  0.00 - 1.49  POCT ACTIVATED CLOTTING TIME     Status: None    Collection Time    03/02/13  8:24 AM      Result Value Range   Activated Clotting Time 119    HEPARIN LEVEL (UNFRACTIONATED)     Status: None   Collection Time    03/02/13 10:43 PM      Result Value Range   Heparin Unfractionated 0.38  0.30 - 0.70 IU/mL   Comment:            IF HEPARIN RESULTS ARE BELOW     EXPECTED VALUES, AND PATIENT     DOSAGE HAS BEEN CONFIRMED,     SUGGEST FOLLOW UP TESTING     OF ANTITHROMBIN III LEVELS.  APTT     Status: Abnormal   Collection Time    03/02/13 10:43 PM      Result Value Range   aPTT 58 (*) 24 - 37 seconds   Comment:            IF BASELINE aPTT IS ELEVATED,     SUGGEST PATIENT RISK  ASSESSMENT     BE USED TO DETERMINE APPROPRIATE     ANTICOAGULANT THERAPY.  HEPARIN LEVEL (UNFRACTIONATED)     Status: None   Collection Time    03/03/13  7:00 AM      Result Value Range   Heparin Unfractionated 0.48  0.30 - 0.70 IU/mL   Comment:            IF HEPARIN RESULTS ARE BELOW     EXPECTED VALUES, AND PATIENT     DOSAGE HAS BEEN CONFIRMED,     SUGGEST FOLLOW UP TESTING     OF ANTITHROMBIN III LEVELS.  CBC     Status: Abnormal   Collection Time    03/03/13  7:00 AM      Result Value Range   WBC 6.3  4.0 - 10.5 K/uL   RBC 4.02  3.87 - 5.11 MIL/uL   Hemoglobin 12.4  12.0 - 15.0 g/dL   HCT 16.1 (*) 09.6 - 04.5 %   MCV 88.8  78.0 - 100.0 fL   MCH 30.8  26.0 - 34.0 pg   MCHC 34.7  30.0 - 36.0 g/dL   RDW 40.9  81.1 - 91.4 %   Platelets 218  150 - 400 K/uL    Imaging: Imaging results have been reviewed  Assessment/Plan:   1. Principal Problem: 2.   Angina pectoris, crescendo 3. Active Problems: 4.   CAD, CABG 1998, cath March 2012- medical Rx., cath 12/2011 medical therapy 5.   Chronic atrial fibrillation - AA thrombus 6.   Anticoagulated, Xarelto 7.   Bradycardia in the past 8.   DOE (dyspnea on exertion) 9.   Syncope, near 10.   Time Spent Directly with Patient:  20 minutes  Length of Stay:  LOS: 3 days   S/P cath which  showed unchanged anatomy, patent grafts. Myoview non ischemic. Can D/C iv hep, re start Xarelto and D/C home. ROV with MLP 1 week and with me 3-4 weeks.  [name] 03/03/2013, 2:11 PM

## 2013-03-03 NOTE — Progress Notes (Signed)
Lexiscan myoview completed without complications.  Initially SOB and mild chest pressure resolved quickly.

## 2013-03-03 NOTE — Progress Notes (Signed)
Pharmacy consulted to transition patient from heparin to xarelto  Per protocol, xarelto to begin at time of heparin discontinuation. Nursing care order entered to coordinate transition. Heparin stop time and xarelto start time in place.  Thank you for allowing me to take part in the care of this patient,  Britt Bottom B. Artelia Laroche, PharmD Clinical Pharmacist - Resident Pager: [phone] Phone: [phone] 03/03/2013 3:02 PM

## 2013-03-03 NOTE — Progress Notes (Addendum)
Subjective:  No recurrent CP.  Objective:  Temp:  [97.6 F (36.4 C)-98.1 F (36.7 C)] 98 F (36.7 C) (11/22 0635) Pulse Rate:  [65-83] 66 (11/22 0635) Resp:  [18-20] 20 (11/22 0635) BP: (108-167)/(42-85) 125/65 mmHg (11/22 0635) SpO2:  [94 %-97 %] 94 % (11/22 0635) Weight:  [116 lb 10 oz (52.9 kg)] 116 lb 10 oz (52.9 kg) (11/22 0023) Weight change: 2 lb 3.3 oz (1 kg)  Intake/Output from previous day: 11/21 0701 - 11/22 0700 In: 677 [P.O.:560; I.V.:117] Out: 1300 [Urine:1300]  Intake/Output from this shift:    Physical Exam: General appearance: alert and no distress Neck: no adenopathy, no carotid bruit, no JVD, supple, symmetrical, trachea midline and thyroid not enlarged, symmetric, no tenderness/mass/nodules Lungs: clear to auscultation bilaterally Heart: regular rate and rhythm, S1, S2 normal, no murmur, click, rub or gallop Extremities: extremities normal, atraumatic, no cyanosis or edema and Right femoral arterial puncture site OK  Lab Results: Results for orders placed during the hospital encounter of 02/28/13 (from the past 48 hour(s))  PROTIME-INR     Status: Abnormal   Collection Time    03/01/13  9:20 AM      Result Value Range   Prothrombin Time 15.6 (*) 11.6 - 15.2 seconds   INR 1.27  0.00 - 1.49  APTT     Status: Abnormal   Collection Time    03/01/13  9:20 AM      Result Value Range   aPTT 191 (*) 24 - 37 seconds   Comment:            IF BASELINE aPTT IS ELEVATED,     SUGGEST PATIENT RISK ASSESSMENT     BE USED TO DETERMINE APPROPRIATE     ANTICOAGULANT THERAPY.  HEPARIN LEVEL (UNFRACTIONATED)     Status: Abnormal   Collection Time    03/01/13  9:20 AM      Result Value Range   Heparin Unfractionated 1.26 (*) 0.30 - 0.70 IU/mL   Comment: RESULTS CONFIRMED BY MANUAL DILUTION                IF HEPARIN RESULTS ARE BELOW     EXPECTED VALUES, AND PATIENT     DOSAGE HAS BEEN CONFIRMED,     SUGGEST FOLLOW UP TESTING     OF ANTITHROMBIN III  LEVELS.  CBC     Status: None   Collection Time    03/02/13  5:05 AM      Result Value Range   WBC 5.6  4.0 - 10.5 K/uL   RBC 4.18  3.87 - 5.11 MIL/uL   Hemoglobin 12.6  12.0 - 15.0 g/dL   HCT 16.1  09.6 - 04.5 %   MCV 89.0  78.0 - 100.0 fL   MCH 30.1  26.0 - 34.0 pg   MCHC 33.9  30.0 - 36.0 g/dL   RDW 40.9  81.1 - 91.4 %   Platelets 227  150 - 400 K/uL  HEPARIN LEVEL (UNFRACTIONATED)     Status: None   Collection Time    03/02/13  5:05 AM      Result Value Range   Heparin Unfractionated 0.63  0.30 - 0.70 IU/mL   Comment:            IF HEPARIN RESULTS ARE BELOW     EXPECTED VALUES, AND PATIENT     DOSAGE HAS BEEN CONFIRMED,     SUGGEST FOLLOW UP TESTING     OF  ANTITHROMBIN III LEVELS.  PROTIME-INR     Status: None   Collection Time    03/02/13  5:05 AM      Result Value Range   Prothrombin Time 13.5  11.6 - 15.2 seconds   INR 1.05  0.00 - 1.49  POCT ACTIVATED CLOTTING TIME     Status: None   Collection Time    03/02/13  8:24 AM      Result Value Range   Activated Clotting Time 119    HEPARIN LEVEL (UNFRACTIONATED)     Status: None   Collection Time    03/02/13 10:43 PM      Result Value Range   Heparin Unfractionated 0.38  0.30 - 0.70 IU/mL   Comment:            IF HEPARIN RESULTS ARE BELOW     EXPECTED VALUES, AND PATIENT     DOSAGE HAS BEEN CONFIRMED,     SUGGEST FOLLOW UP TESTING     OF ANTITHROMBIN III LEVELS.  APTT     Status: Abnormal   Collection Time    03/02/13 10:43 PM      Result Value Range   aPTT 58 (*) 24 - 37 seconds   Comment:            IF BASELINE aPTT IS ELEVATED,     SUGGEST PATIENT RISK ASSESSMENT     BE USED TO DETERMINE APPROPRIATE     ANTICOAGULANT THERAPY.    Imaging: Imaging results have been reviewed  Assessment/Plan:   1. Principal Problem: 2.   Angina pectoris, crescendo 3. Active Problems: 4.   CAD, CABG 1998, cath March 2012- medical Rx., cath 12/2011 medical therapy 5.   Chronic atrial fibrillation - AA thrombus 6.    Anticoagulated, Xarelto 7.   Bradycardia in the past 8.   DOE (dyspnea on exertion) 9.   Syncope, near 10.   Time Spent Directly with Patient:  20 minutes  Length of Stay:  LOS: 3 days   S/P cath yesterday with anatomy unchanged from prior cath. Plan lexiscan myoview this morning to determine if the LAD and/or RCA territories are ischemic. If not, can D/C home today on Med Rx. Remains in AFIB with CVR on iv hep. Restart Xarelto if Myoview neg and no intervention planned.   [name] 03/03/2013, 7:31 AM

## 2013-03-03 NOTE — Progress Notes (Signed)
Pt had ambulated to 1100-150 feet without c/o of SOB. Pt tolerated it well.

## 2013-03-03 NOTE — Progress Notes (Signed)
Heparin per Rx  AC: Transitioning from Xarelto (Afib) to heparin. Baseline heparin level = 1.82, S/p cath and resumed heparin.  Heparin level 0.48.  Hgb 12.4 and Plt 218 K stable.  Had myoview done this am. If negative will change back to xarelto.  Last xarelto dose was 11/19   Goal of Therapy:  Heparin level 0.3-0.7 units/ml  Plan:  - Cont heparin at 600 units/hr  - daily heparin level and CBC - f/u resume of xarelto

## 2013-03-04 NOTE — Discharge Summary (Signed)
Physician Discharge Summary       Patient ID: JIMMY STIPES MRN: [id number] DOB/AGE: 1922/10/17 79 y.o.  Admit date: 02/28/2013 Discharge date: 03/03/2013  Discharge Diagnoses:  Principal Problem:   Angina pectoris, crescendo Active Problems:   CAD, CABG 1998, cath March 2012- medical Rx., cath 12/2011 medical therapy   Chronic atrial fibrillation - AA thrombus   Bradycardia, HR in the 40s on admit, meds adjusted     DOE (dyspnea on exertion), possible due to bradycardia   Syncope, near   Anticoagulated, Xarelto   Discharged Condition: good  Procedures: 03/02/13 Cardiac cath by Dr. Crista Curb Course:  79 year old thin and frail appearing widowed Caucasian female mother of 3 who is accompanied by one of her daughters 02/28/13. She was last seen by Dr. Allyson Sabal 2 months ago. She has a history of CAD status post coronary artery bypass grafting in 1998. She was catheterized in 2000, 2005, 2010, 2012 and most recently December 20, 2011 revealing intact vein grafts, an atretic LIMA with normal LV function and 2+ MR. She did have a 95% LAD lesion beyond an occluded proximal LAD which was filled retrograde by a diagonal branch vein graft. She has chronic atrial fibrillation on Xarelto. She had unsuccessful attempts at cardioversion because of residual "smoke" in her left atrial appendage. Her other problems include hypertension and hyperlipidemia.  She called to be seen 02/28/13 for chest pain, shortness of breath and near syncope. Since her last visit with Dr. Allyson Sabal she tells me she started having chest pains shortly thereafter. It is occasional occurrence but has progressed significantly this past month. This week she is having episodes daily sometimes twice a day and the chest pressure she has experienced has wakened her from sleep as well. She does not take nitroglycerin as it has not helped her with angina previously. She cannot walk across the room without being significantly shorter  breath, walking in from the waiting room it was obviously she had distress. When the shortness of breath she has chest pressure. After she rests it subsides. She is on Imdur 120 daily, metoprolol XL 50 mg, and when she was on higher doses of beta blocker she has developed bradycardia in the past. She also states she becomes lightheaded and feels like she will pass out.  Her EKG today continues atrial fibrillation with no acute changes. Due to the significance of her angina with increased frequency, we admitted to step down to rule out MI, treat with IV Lasix, begin IV nitroglycerin and plan for cardiac catheterization.  On arrival to stepdown her HR was 44.  I decreased her BB and her cardizem.  Her enzymes were negative, CXR without CHF.  She underwent cath revealing : patent vein grafts with anatomy that is similar to her prior cath for all intents and purposes. She does have a small vein graft to the PDA 70% segmental proximal stenosis, patent vein graft to the OM branch and to the diagonal branch which filled the LAD retrograde. There is a stenosis in the LAD beyond the diagonal branch takeoff this is a prior cath as well. Her LIMA is known to be atretic.   The next morning she had Lexiscan myoview and it was negative for ischemia. She ambulated without anymore SOB or chest pain.  We had restarted Ranexa as well.  She was restarted on Xarelto and discharged home. She will follow up as outpt and wear a cardionet to evaluate for rapid or slow beats.  Consults: None  Significant Diagnostic Studies:  BMET    Component Value Date/Time   NA 139 03/01/2013 0526   K 3.6 03/01/2013 0526   CL 100 03/01/2013 0526   CO2 30 03/01/2013 0526   GLUCOSE 91 03/01/2013 0526   BUN 14 03/01/2013 0526   CREATININE 0.96 03/01/2013 0526   CALCIUM 9.9 03/01/2013 0526   GFRNONAA 51* 03/01/2013 0526   GFRAA 59* 03/01/2013 0526    CBC    Component Value Date/Time   WBC 6.3 03/03/2013 0700   RBC 4.02  03/03/2013 0700   HGB 12.4 03/03/2013 0700   HCT 35.7* 03/03/2013 0700   PLT 218 03/03/2013 0700   MCV 88.8 03/03/2013 0700   MCH 30.8 03/03/2013 0700   MCHC 34.7 03/03/2013 0700   RDW 13.6 03/03/2013 0700   LYMPHSABS 1.8 02/28/2013 1800   MONOABS 0.6 02/28/2013 1800   EOSABS 0.1 02/28/2013 1800   BASOSABS 0.0 02/28/2013 1800   BNP    Component Value Date/Time   PROBNP 863.2* 02/28/2013 1800   Troponin negative X 3 <0.30  2D Echo:  Left ventricle: The cavity size was normal. Wall thickness was increased in a pattern of mild LVH. Systolic function was normal. The estimated ejection fraction was in the range of 55% to 60%. The evaluation of LV diastolic function was not ableto be assesseddue to the presence of AF. - Aortic valve: Mildly to moderately calcified annulus. Trileaflet; moderately calcified leaflets. There was moderate to severely reduced excursion of the non-coronary cusp There was moderate stenosis. Mild regurgitation. Valve area: 1.1cm^2(VTI). Valve area: 1.09cm^2 (Vmax). - Mitral valve: Moderately calcified annulus. Moderately thickened, moderately calcified leaflets . Mild regurgitation. - Left atrium: The atrium was mildly dilated. - Right ventricle: Systolic pressure was increased. - Atrial septum: No defect or patent foramen ovale was identified. - Tricuspid valve: Mild-moderate regurgitation. - Pulmonic valve: Mild regurgitation. - Pulmonary arteries: PA peak pressure: 34mm Hg (S). Impressions:  - The right ventricular systolic pressure was increased consistent with mild pulmonary hypertension.  Total chol 195, TG 80, HDL 59 LDL 120  HgB A1C 5.8  TSH 1.610  CXR: EXAM: PORTABLE CHEST - 1 VIEW COMPARISON: 10/09/2012 FINDINGS: Stable enlarged cardiac silhouette. No effusion, infiltrate, pneumothorax. Lungs are mildly hyperinflated. Midline sternotomy noted. IMPRESSION: No acute cardiopulmonary process.    Discharge Exam: Blood pressure  124/67, pulse 80, temperature 97.7 F (36.5 C), temperature source Oral, resp. rate 18, height 5\' 2"  (1.575 m), weight 116 lb 10 oz (52.9 kg), SpO2 97.00%. AM exam: Physical Exam:  General appearance: alert and no distress  Neck: no adenopathy, no carotid bruit, no JVD, supple, symmetrical, trachea midline and thyroid not enlarged, symmetric, no tenderness/mass/nodules  Lungs: clear to auscultation bilaterally  Heart: regular rate and rhythm, S1, S2 normal, no murmur, click, rub or gallop  Extremities: extremities normal, atraumatic, no cyanosis or edema and Right femoral arterial puncture site OK     Disposition: 01-Home or Self Care       Future Appointments Provider Department Dept Phone   04/10/2013 10:15 AM [name], MD [facility] [phone]       Medication List    STOP taking these medications       diltiazem 180 MG 24 hr capsule  Commonly known as:  CARDIZEM CD     Fish Oil 1200 MG Caps  Replaced by:  omega-3 acid ethyl esters 1 G capsule     metoprolol succinate 50 MG 24 hr tablet  Commonly known as:  TOPROL-XL      TAKE these medications       acetaminophen 650 MG CR tablet  Commonly known as:  TYLENOL  Take 1,300 mg by mouth every 4 (four) hours as needed for pain.     aspirin 81 MG EC tablet  Take 1 tablet (81 mg total) by mouth daily.     cholecalciferol 1000 UNITS tablet  Commonly known as:  VITAMIN D  Take 2,000 Units by mouth daily.     diltiazem 60 MG tablet  Commonly known as:  CARDIZEM  Take 1 tablet (60 mg total) by mouth every 12 (twelve) hours.     DSS 100 MG Caps  Take 100 mg by mouth every 12 (twelve) hours.     ESTER C PO  Take 500 mg by mouth daily.     ezetimibe 10 MG tablet  Commonly known as:  ZETIA  Take 10 mg by mouth at bedtime.     fluvastatin XL 80 MG 24 hr tablet  Commonly known as:  LESCOL XL  Take 80 mg by mouth daily.     furosemide 40 MG tablet  Commonly known as:  LASIX  Take 20-40 mg by  mouth every other day. Alternates 20 mg 1 day, then 40 mg 1 day, etc.     isosorbide mononitrate 120 MG 24 hr tablet  Commonly known as:  IMDUR  Take 1 tablet (120 mg total) by mouth daily.     metoprolol tartrate 25 MG tablet  Commonly known as:  LOPRESSOR  Take 0.5 tablets (12.5 mg total) by mouth 2 (two) times daily.     multivitamin with minerals Tabs tablet  Take 1 tablet by mouth daily.     nitroGLYCERIN 0.4 MG SL tablet  Commonly known as:  NITROSTAT  Place 1 tablet (0.4 mg total) under the tongue every 5 (five) minutes x 3 doses as needed for chest pain.     omega-3 acid ethyl esters 1 G capsule  Commonly known as:  LOVAZA  Take 1 capsule (1 g total) by mouth daily.     pantoprazole 40 MG tablet  Commonly known as:  PROTONIX  Take 1 tablet (40 mg total) by mouth daily.     polyethylene glycol packet  Commonly known as:  MIRALAX / GLYCOLAX  Take 17 g by mouth daily.     polyvinyl alcohol 1.4 % ophthalmic solution  Commonly known as:  LIQUIFILM TEARS  Place 1 drop into both eyes at bedtime.     ranolazine 500 MG 12 hr tablet  Commonly known as:  RANEXA  Take 1 tablet (500 mg total) by mouth 2 (two) times daily.     Rivaroxaban 15 MG Tabs tablet  Commonly known as:  XARELTO  Take 15 mg by mouth daily.     zolpidem 10 MG tablet  Commonly known as:  AMBIEN  Take 10 mg by mouth at bedtime.       Follow-up Information   Follow up with [name], MD. (our office will call you Monday with date and time of appt in week and then Dr. Allyson Sabal therafter )    Specialty:  Cardiology   Contact information:   [address] [place] [address] [phone]        Discharge Instructions: If you develop eye bleed again stop ASA.  Heart Healthy diet.  Call if you have any problems.  We changed your heart meds so your heart  rate will be higher.  We will arrange for you to have a monitor to wear. The office will call you on Monday to  arrange.  Signed: Leone Brand Nurse Practitioner-Certified [facility]: [facility] 03/04/2013, 3:19 PM  Time spent on discharge : 40 minutes.

## 2013-03-05 ENCOUNTER — Telehealth: Payer: Self-pay | Admitting: Cardiovascular Disease

## 2013-03-05 NOTE — Telephone Encounter (Signed)
Returned call and pt verified x 2.  Pt stated her daughter looked it over again and found it.  Pt informed it was listed under the generic name.  Pt verbalized understanding and agreed w/ plan.

## 2013-03-05 NOTE — Telephone Encounter (Signed)
Was discharged from the hospital on Sunday. On her medicine list Zetia was not listed. Does she she still need to take it?

## 2013-03-07 ENCOUNTER — Ambulatory Visit: Payer: Medicare Other | Admitting: *Deleted

## 2013-03-07 ENCOUNTER — Telehealth: Payer: Self-pay | Admitting: Cardiovascular Disease

## 2013-03-07 DIAGNOSIS — I4891 Unspecified atrial fibrillation: Secondary | ICD-10-CM

## 2013-03-07 DIAGNOSIS — I498 Other specified cardiac arrhythmias: Secondary | ICD-10-CM

## 2013-03-07 DIAGNOSIS — R001 Bradycardia, unspecified: Secondary | ICD-10-CM

## 2013-03-07 DIAGNOSIS — R55 Syncope and collapse: Secondary | ICD-10-CM

## 2013-03-07 NOTE — Progress Notes (Signed)
30 day event monitor placed for bradycardia and syncope.  Order at hospital discharge

## 2013-03-07 NOTE — Telephone Encounter (Signed)
Cathy Ortega (cardionet) called with a baseline monitor strip of A fib.  Cathy Ortega is in known AFib and is on Xarelto.  montior was placed for syncope and bradycardia

## 2013-03-13 ENCOUNTER — Ambulatory Visit: Payer: Medicare Other | Admitting: Cardiology

## 2013-03-16 ENCOUNTER — Telehealth: Payer: Self-pay | Admitting: Cardiovascular Disease

## 2013-03-16 NOTE — Telephone Encounter (Signed)
Wearing a monitor and it is irritating her skin,very uncomfortable. Wants to know if she can take it off?

## 2013-03-16 NOTE — Telephone Encounter (Signed)
Electrodes are irritating skin to the point she is scratching them off and they are leaving red areas on her chest even though she is moving them around.  Instructed her to call [employer] and request pediatric electrodes and they will mail out to her next day.  Voiced understanding.  She will try and let us know if she has a problem with the other electrodes.

## 2013-03-22 ENCOUNTER — Telehealth: Payer: Self-pay | Admitting: Cardiovascular Disease

## 2013-03-22 NOTE — Telephone Encounter (Signed)
Rapid Afib earlier today, lasted 75 secs and highest rate 186 bpm.  No symptoms.  Stated pt has h/o chronic Afib.  Will fax report.

## 2013-03-23 NOTE — Telephone Encounter (Signed)
Fax received and placed on Dr. Berry's cart for review.  Message forwarded to K. Vogel, RN to discuss w/ Dr. Berry.  

## 2013-03-30 ENCOUNTER — Telehealth: Payer: Self-pay | Admitting: Cardiovascular Disease

## 2013-03-30 NOTE — Telephone Encounter (Signed)
Dr Allyson Sabal reviewed the strip.  No new orders given.

## 2013-03-30 NOTE — Telephone Encounter (Signed)
6:38 am Rapid Afib, highest HR 179 bpm over a min long, Asymptomatic.  Stated pt is always in Afib.  Will fax report.

## 2013-03-30 NOTE — Telephone Encounter (Signed)
Fax received and placed on Dr. Hazle Coca cart for review.  Message forwarded to K. Petra Kuba, RN to discuss w/ Dr. Allyson Sabal.

## 2013-04-03 ENCOUNTER — Telehealth: Payer: Self-pay | Admitting: Cardiovascular Disease

## 2013-04-03 NOTE — Telephone Encounter (Signed)
Message forwarded to K. Petra Kuba, RN to discuss w/ Dr. Allyson Sabal [facility]).

## 2013-04-03 NOTE — Telephone Encounter (Signed)
She wanted you to know she took her monitor off today because it was irritating her skin so much. She is going to mail it back to the company,just wanted you to know this information.

## 2013-04-09 ENCOUNTER — Telehealth: Payer: Self-pay | Admitting: Cardiovascular Disease

## 2013-04-09 NOTE — Telephone Encounter (Signed)
When she was released from hospital was given rx for metoprol tar 25 mg tab myla   She is having side effects  Dry mouth and eyes headache, runny nose  She did not take today . Sees Dr Allyson Sabal tomorrow.  Please call

## 2013-04-09 NOTE — Telephone Encounter (Signed)
Returned call and pt verified x 2.  Pt stated she stopped taking metoprolol yesterday b/c she was having dry mouth, eyes, HA and runny nose.  Pt stated she already feels better.  Pt informed symptoms likely r/t fluid pill (furosemide), especially if she isn't taking in a lot of fluids.  Pt also informed metoprolol is a BP med and also controls HR.  Pt stated she understands and her BP has been "every which way."  Pt stated recent BP: 153/78, 88; 133/63, 88; 130/76, 87; 158/88, 91.  Pt stated she just wanted to let the doctor know she stopped taking it.  Pt informed Dr. Logan Bores will be notified to discuss at her appt tomorrow since she is not going to take metoprolol.  Pt verbalized understanding and agreed w/ plan.  Message forwarded to K. Petra Kuba, RN to discuss w/ Dr. Allyson Sabal.

## 2013-04-09 NOTE — Telephone Encounter (Signed)
noted 

## 2013-04-10 ENCOUNTER — Ambulatory Visit (INDEPENDENT_AMBULATORY_CARE_PROVIDER_SITE_OTHER): Payer: Medicare Other | Admitting: Cardiovascular Disease

## 2013-04-10 ENCOUNTER — Encounter: Payer: Self-pay | Admitting: Cardiovascular Disease

## 2013-04-10 VITALS — BP 110/60 | HR 72 | Ht 62.0 in | Wt 117.1 lb

## 2013-04-10 DIAGNOSIS — I482 Chronic atrial fibrillation, unspecified: Secondary | ICD-10-CM

## 2013-04-10 DIAGNOSIS — I4891 Unspecified atrial fibrillation: Secondary | ICD-10-CM

## 2013-04-10 DIAGNOSIS — E785 Hyperlipidemia, unspecified: Secondary | ICD-10-CM | POA: Insufficient documentation

## 2013-04-10 DIAGNOSIS — I2510 Atherosclerotic heart disease of native coronary artery without angina pectoris: Secondary | ICD-10-CM

## 2013-04-10 MED ORDER — METOPROLOL TARTRATE 25 MG PO TABS: 12.5000 mg | ORAL_TABLET | Freq: Two times a day (BID) | ORAL | Status: AC

## 2013-04-10 MED ORDER — RANOLAZINE ER 500 MG PO TB12: 500.0000 mg | ORAL_TABLET | Freq: Two times a day (BID) | ORAL | Status: AC

## 2013-04-10 MED ORDER — EZETIMIBE 10 MG PO TABS: 10.0000 mg | ORAL_TABLET | Freq: Every day | ORAL | Status: AC

## 2013-04-10 MED ORDER — EZETIMIBE 10 MG PO TABS: 10.0000 mg | ORAL_TABLET | Freq: Every day | ORAL | Status: DC

## 2013-04-10 MED ORDER — OMEGA-3-ACID ETHYL ESTERS 1 G PO CAPS: 1.0000 g | ORAL_CAPSULE | Freq: Every day | ORAL | Status: AC

## 2013-04-10 NOTE — Assessment & Plan Note (Addendum)
Coronary artery bypass graft in 1998 with multiple catheterizations since most recently during her admission 02/28/13 because of angina and shortness of breath. Her anatomy was unchanged and a Myoview stress test is low risk. She was discharged home on medical therapy. Since being at home she continues to complain of dyspnea but has not had any chest pain.

## 2013-04-10 NOTE — Progress Notes (Signed)
04/10/2013 Cathy Ortega   February 14, 1923  [id number]  Primary Physician Colette Ribas, MD Primary Cardiologist: [name] MD [name]    HPI:  79 year old thin and frail appearing widowed Caucasian female mother of 3 who is accompanied by one of her daughters 02/28/13. She was last seen by Dr. Allyson Sabal 2 months ago. She has a history of CAD status post coronary artery bypass grafting in 1998. She was catheterized in 2000, 2005, 2010, 2012 and most recently December 20, 2011 revealing intact vein grafts, an atretic LIMA with normal LV function and 2+ MR. She did have a 95% LAD lesion beyond an occluded proximal LAD which was filled retrograde by a diagonal branch vein graft. She has chronic atrial fibrillation on Xarelto. She had unsuccessful attempts at cardioversion because of residual "smoke" in her left atrial appendage. Her other problems include hypertension and hyperlipidemia.  She called to be seen 02/28/13 for chest pain, shortness of breath and near syncope. Since her last visit with Dr. Allyson Sabal she tells me she started having chest pains shortly thereafter. It is occasional occurrence but has progressed significantly this past month. This week she is having episodes daily sometimes twice a day and the chest pressure she has experienced has wakened her from sleep as well. She does not take nitroglycerin as it has not helped her with angina previously. She cannot walk across the room without being significantly shorter breath, walking in from the waiting room it was obviously she had distress. When the shortness of breath she has chest pressure. After she rests it subsides. She is on Imdur 120 daily, metoprolol XL 50 mg, and when she was on higher doses of beta blocker she has developed bradycardia in the past. She also states she becomes lightheaded and feels like she will pass out.  Her EKG today continues atrial fibrillation with no acute changes. Due to the  significance of her angina with increased frequency, we admitted to step down to rule out MI, treat with IV Lasix, begin IV nitroglycerin and plan for cardiac catheterization. On arrival to stepdown her HR was 44. I decreased her BB and her cardizem.  Her enzymes were negative, CXR without CHF. She underwent cath revealing :  patent vein grafts with anatomy that is similar to her prior cath for all intents and purposes. She does have a small vein graft to the PDA 70% segmental proximal stenosis, patent vein graft to the OM branch and to the diagonal branch which filled the LAD retrograde. There is a stenosis in the LAD beyond the diagonal branch takeoff this is a prior cath as well. Her LIMA is known to be atretic.   The next morning she had Lexiscan myoview and it was negative for ischemia. She ambulated without anymore SOB or chest pain. We had restarted Ranexa as well. She was restarted on Xarelto and discharged home. She will follow up as outpt and wear a cardionet to evaluate for rapid or slow beats  Since discharge from the hospital one month ago she has been some dyspnea but denies chest pain. An event monitor showed tachybradycardia syndrome with at episodes of A. Fib with RVR.   Current Outpatient Prescriptions  Medication Sig Dispense Refill  . acetaminophen (TYLENOL) 650 MG CR tablet Take 1,300 mg by mouth every 4 (four) hours as needed for pain.      Marland Kitchen aspirin EC 81 MG EC tablet Take 1 tablet (81 mg total) by mouth daily.      Marland Kitchen  Bioflavonoid Products (ESTER C PO) Take 500 mg by mouth daily.      . cholecalciferol (VITAMIN D) 1000 UNITS tablet Take 2,000 Units by mouth daily.        Marland Kitchen diltiazem (CARDIZEM) 60 MG tablet Take 1 tablet (60 mg total) by mouth every 12 (twelve) hours.  60 tablet  3  . docusate sodium 100 MG CAPS Take 100 mg by mouth every 12 (twelve) hours.  10 capsule  0  . fluvastatin XL (LESCOL XL) 80 MG 24 hr tablet Take 80 mg by mouth daily.        . furosemide (LASIX) 40  MG tablet Take 20-40 mg by mouth every other day. Alternates 20 mg 1 day, then 40 mg 1 day, etc.      . isosorbide mononitrate (IMDUR) 120 MG 24 hr tablet Take 1 tablet (120 mg total) by mouth daily.  90 tablet  3  . metoprolol tartrate (LOPRESSOR) 25 MG tablet Take 0.5 tablets (12.5 mg total) by mouth 2 (two) times daily.  30 tablet  6  . Multiple Vitamin (MULITIVITAMIN WITH MINERALS) TABS Take 1 tablet by mouth daily.      . nitroGLYCERIN (NITROSTAT) 0.4 MG SL tablet Place 1 tablet (0.4 mg total) under the tongue every 5 (five) minutes x 3 doses as needed for chest pain.  25 tablet  1  . omega-3 acid ethyl esters (LOVAZA) 1 G capsule Take 1 capsule (1 g total) by mouth daily.  30 capsule  6  . pantoprazole (PROTONIX) 40 MG tablet Take 1 tablet (40 mg total) by mouth daily.  90 tablet  3  . polyethylene glycol (MIRALAX / GLYCOLAX) packet Take 17 g by mouth daily.        . polyvinyl alcohol (LIQUIFILM TEARS) 1.4 % ophthalmic solution Place 1 drop into both eyes at bedtime.  15 mL  0  . ranolazine (RANEXA) 500 MG 12 hr tablet Take 1 tablet (500 mg total) by mouth 2 (two) times daily.  60 tablet  6  . Rivaroxaban (XARELTO) 15 MG TABS tablet Take 15 mg by mouth daily.       Marland Kitchen zolpidem (AMBIEN) 10 MG tablet Take 10 mg by mouth at bedtime.         No current facility-administered medications for this visit.    Allergies  Allergen Reactions  . Celebrex [Celecoxib] Other (See Comments)    Unknown   . Ciprofloxacin Hcl Other (See Comments)    unknown  . Codeine Other (See Comments)    unknown  . Doxycycline Other (See Comments)    unknown  . Famotidine Other (See Comments)    unknown  . Levofloxacin Other (See Comments)    unknown  . Macrolides And Ketolides Other (See Comments)    unknown  . Penicillins Other (See Comments)    Unknown; possible cross reactor with eggs.   . Plavix [Clopidogrel Bisulfate] Other (See Comments)    Unknown   . Warfarin Sodium Other (See Comments)    Makes  my skin bleed and also caused internal bleeding  . Amlodipine Rash    History   Social History  . Marital Status: Widowed    Spouse Name: N/A    Number of Children: N/A  . Years of Education: N/A   Occupational History  . Not on file.   Social History Main Topics  . Smoking status: Never Smoker   . Smokeless tobacco: Never Used  . Alcohol Use: No  . Drug  Use: No  . Sexual Activity: Not on file   Other Topics Concern  . Not on file   Social History Narrative  . No narrative on file     Review of Systems: General: negative for chills, fever, night sweats or weight changes.  Cardiovascular: negative for chest pain, dyspnea on exertion, edema, orthopnea, palpitations, paroxysmal nocturnal dyspnea or shortness of breath Dermatological: negative for rash Respiratory: negative for cough or wheezing Urologic: negative for hematuria Abdominal: negative for nausea, vomiting, diarrhea, bright red blood per rectum, melena, or hematemesis Neurologic: negative for visual changes, syncope, or dizziness All other systems reviewed and are otherwise negative except as noted above.    Blood pressure 110/60, pulse 72, height 5\' 2"  (1.575 m), weight 117 lb 1.6 oz (53.116 kg).  General appearance: alert and no distress Neck: no adenopathy, no JVD, supple, symmetrical, trachea midline, thyroid not enlarged, symmetric, no tenderness/mass/nodules and soft right carotid bruit Lungs: clear to auscultation bilaterally Heart: irregularly irregular rhythm Extremities: extremities normal, atraumatic, no cyanosis or edema  EKG atrial fibrillation with a ventricular response of 72  ASSESSMENT AND PLAN:   CAD, CABG 1998, cath March 2012- medical Rx., cath 12/2011 medical therapy Coronary artery bypass graft in 1998 with multiple catheterizations since most recently during her admission 02/28/13 because of angina and shortness of breath. Her anatomy was unchanged and a Myoview stress test is low  risk. She was discharged home on medical therapy. Since being at home she continues to complain of dyspnea but has not had any chest pain.  Chronic atrial fibrillation - AA thrombus Patient took herself off beta blocker with resultant increase in temperature ventricular response. She had an event monitor which confirmed this. She is on Xarelto oral anticoagulation. She started back on her metoprolol today.  Hyperlipidemia She is on statin therapy. Recent lipid profile showed increase in her cholesterol to 195 with LDL of 120. For some reason she was taken off her Zetia which I will restart and recheck her lipid profile.      [name] MD FACP,FACC,FAHA, [facility] 04/10/2013 12:11 PM

## 2013-04-10 NOTE — Patient Instructions (Signed)
Dr Allyson Sabal wants you to RESTART Zetia 10 mg and have some lab work done in 2 months.  Dr Allyson Sabal wants you to follow up with Nada Boozer, NP in 3 months.  Dr Allyson Sabal wants you to follow-up in 6 months. You will receive a reminder letter in the mail two months in advance. If you don't receive a letter, please call our office to schedule the follow-up appointment.

## 2013-04-10 NOTE — Assessment & Plan Note (Signed)
Patient took herself off beta blocker with resultant increase in temperature ventricular response. She had an event monitor which confirmed this. She is on Xarelto oral anticoagulation. She started back on her metoprolol today.

## 2013-04-10 NOTE — Assessment & Plan Note (Signed)
She is on statin therapy. Recent lipid profile showed increase in her cholesterol to 195 with LDL of 120. For some reason she was taken off her Zetia which I will restart and recheck her lipid profile.

## 2013-04-14 ENCOUNTER — Emergency Department (HOSPITAL_COMMUNITY)
Admission: EM | Admit: 2013-04-14 | Discharge: 2013-04-14 | Disposition: A | Discharge status: Home / Self Care | Payer: Medicare Other | Attending: Emergency Medicine | Admitting: Emergency Medicine

## 2013-04-14 ENCOUNTER — Encounter (HOSPITAL_COMMUNITY): Payer: Self-pay | Admitting: Emergency Medicine

## 2013-04-14 DIAGNOSIS — I498 Other specified cardiac arrhythmias: Secondary | ICD-10-CM | POA: Insufficient documentation

## 2013-04-14 DIAGNOSIS — Z90710 Acquired absence of both cervix and uterus: Secondary | ICD-10-CM | POA: Insufficient documentation

## 2013-04-14 DIAGNOSIS — Z951 Presence of aortocoronary bypass graft: Secondary | ICD-10-CM | POA: Insufficient documentation

## 2013-04-14 DIAGNOSIS — Z7982 Long term (current) use of aspirin: Secondary | ICD-10-CM | POA: Insufficient documentation

## 2013-04-14 DIAGNOSIS — Z9889 Other specified postprocedural states: Secondary | ICD-10-CM | POA: Insufficient documentation

## 2013-04-14 DIAGNOSIS — Z7901 Long term (current) use of anticoagulants: Secondary | ICD-10-CM | POA: Insufficient documentation

## 2013-04-14 DIAGNOSIS — R011 Cardiac murmur, unspecified: Secondary | ICD-10-CM | POA: Insufficient documentation

## 2013-04-14 DIAGNOSIS — K219 Gastro-esophageal reflux disease without esophagitis: Secondary | ICD-10-CM | POA: Insufficient documentation

## 2013-04-14 DIAGNOSIS — Z880 Allergy status to penicillin: Secondary | ICD-10-CM | POA: Insufficient documentation

## 2013-04-14 DIAGNOSIS — K2970 Gastritis, unspecified, without bleeding: Secondary | ICD-10-CM | POA: Insufficient documentation

## 2013-04-14 DIAGNOSIS — K644 Residual hemorrhoidal skin tags: Secondary | ICD-10-CM | POA: Insufficient documentation

## 2013-04-14 DIAGNOSIS — Z9089 Acquired absence of other organs: Secondary | ICD-10-CM | POA: Insufficient documentation

## 2013-04-14 DIAGNOSIS — I10 Essential (primary) hypertension: Secondary | ICD-10-CM | POA: Insufficient documentation

## 2013-04-14 DIAGNOSIS — I4891 Unspecified atrial fibrillation: Secondary | ICD-10-CM | POA: Insufficient documentation

## 2013-04-14 DIAGNOSIS — M129 Arthropathy, unspecified: Secondary | ICD-10-CM | POA: Insufficient documentation

## 2013-04-14 DIAGNOSIS — Z8673 Personal history of transient ischemic attack (TIA), and cerebral infarction without residual deficits: Secondary | ICD-10-CM | POA: Insufficient documentation

## 2013-04-14 DIAGNOSIS — Z79899 Other long term (current) drug therapy: Secondary | ICD-10-CM | POA: Insufficient documentation

## 2013-04-14 DIAGNOSIS — J4489 Other specified chronic obstructive pulmonary disease: Secondary | ICD-10-CM | POA: Insufficient documentation

## 2013-04-14 DIAGNOSIS — Z981 Arthrodesis status: Secondary | ICD-10-CM | POA: Insufficient documentation

## 2013-04-14 DIAGNOSIS — E782 Mixed hyperlipidemia: Secondary | ICD-10-CM | POA: Insufficient documentation

## 2013-04-14 DIAGNOSIS — K2990 Gastroduodenitis, unspecified, without bleeding: Principal | ICD-10-CM

## 2013-04-14 DIAGNOSIS — Z862 Personal history of diseases of the blood and blood-forming organs and certain disorders involving the immune mechanism: Secondary | ICD-10-CM | POA: Insufficient documentation

## 2013-04-14 DIAGNOSIS — J449 Chronic obstructive pulmonary disease, unspecified: Secondary | ICD-10-CM | POA: Insufficient documentation

## 2013-04-14 DIAGNOSIS — I2510 Atherosclerotic heart disease of native coronary artery without angina pectoris: Secondary | ICD-10-CM | POA: Insufficient documentation

## 2013-04-14 LAB — CBC
HEMATOCRIT: 38.4 % (ref 36.0–46.0)
HEMOGLOBIN: 12.9 g/dL (ref 12.0–15.0)
MCH: 30.1 pg (ref 26.0–34.0)
MCHC: 33.6 g/dL (ref 30.0–36.0)
MCV: 89.5 fL (ref 78.0–100.0)
Platelets: 244 10*3/uL (ref 150–400)
RBC: 4.29 MIL/uL (ref 3.87–5.11)
RDW: 13.9 % (ref 11.5–15.5)
WBC: 5.4 10*3/uL (ref 4.0–10.5)

## 2013-04-14 LAB — URINE MICROSCOPIC-ADD ON

## 2013-04-14 LAB — URINALYSIS, ROUTINE W REFLEX MICROSCOPIC
Bilirubin Urine: NEGATIVE
Glucose, UA: NEGATIVE mg/dL
HGB URINE DIPSTICK: NEGATIVE
Ketones, ur: 15 mg/dL — AB
Nitrite: NEGATIVE
PROTEIN: NEGATIVE mg/dL
Specific Gravity, Urine: 1.016 (ref 1.005–1.030)
UROBILINOGEN UA: 0.2 mg/dL (ref 0.0–1.0)
pH: 7.5 (ref 5.0–8.0)

## 2013-04-14 LAB — COMPREHENSIVE METABOLIC PANEL
ALT: 18 U/L (ref 0–35)
AST: 21 U/L (ref 0–37)
Albumin: 3.5 g/dL (ref 3.5–5.2)
Alkaline Phosphatase: 77 U/L (ref 39–117)
BUN: 12 mg/dL (ref 6–23)
CALCIUM: 9.4 mg/dL (ref 8.4–10.5)
CO2: 27 meq/L (ref 19–32)
CREATININE: 0.68 mg/dL (ref 0.50–1.10)
Chloride: 100 mEq/L (ref 96–112)
GFR, EST AFRICAN AMERICAN: 88 mL/min — AB (ref >90)
GFR, EST NON AFRICAN AMERICAN: 76 mL/min — AB (ref >90)
GLUCOSE: 95 mg/dL (ref 70–99)
Potassium: 3.8 mEq/L (ref 3.7–5.3)
Sodium: 138 mEq/L (ref 137–147)
Total Bilirubin: 0.9 mg/dL (ref 0.3–1.2)
Total Protein: 6.8 g/dL (ref 6.0–8.3)

## 2013-04-14 LAB — OCCULT BLOOD, POC DEVICE: Fecal Occult Bld: NEGATIVE

## 2013-04-14 MED ORDER — MORPHINE SULFATE 4 MG/ML IJ SOLN
2.0000 mg | Freq: Once | INTRAMUSCULAR | Status: AC
  Administered 2013-04-14: 2 mg via INTRAVENOUS
  Filled 2013-04-14: qty 1

## 2013-04-14 MED ORDER — ONDANSETRON HCL 4 MG/2ML IJ SOLN
4.0000 mg | Freq: Once | INTRAMUSCULAR | Status: AC
  Administered 2013-04-14: 4 mg via INTRAVENOUS
  Filled 2013-04-14: qty 2

## 2013-04-14 MED ORDER — ONDANSETRON 4 MG PO TBDP: 4.0000 mg | ORAL_TABLET | Freq: Three times a day (TID) | ORAL | Status: AC | PRN

## 2013-04-14 MED ORDER — SODIUM CHLORIDE 0.9 % IV BOLUS (SEPSIS)
500.0000 mL | Freq: Once | INTRAVENOUS | Status: AC
  Administered 2013-04-14: 500 mL via INTRAVENOUS

## 2013-04-14 NOTE — ED Provider Notes (Signed)
CSN: [id number]     Arrival date & time 04/14/13  1115 History   First MD Initiated Contact with Patient 04/14/13 1321     Chief Complaint  Patient presents with  . Emesis   (Consider location/radiation/quality/duration/timing/severity/associated sxs/prior Treatment) HPI Comments: Cathy Ortega is a 80 year-old female with a past medical history of A-fib, CAD, CVA, presenting the Emergency Department with a chief complaint of abdominal pain since yesterday afternoon.  The patient reports she ate buffalo chicken at a restaurant and then experienced epigastric discomfort.  She reports associated black watery diarrhea  x2 yesterday.  She reports 6 episodes of emesis that consisted of food, no frank blood or coffee ground emesis.  Last emesis was 0200 today.  The patient reports she is the only person who ate the buffalo chicken and there is not one else that is sick from the restaurant in their party. She denies fever.  The patient denies a history of GI bleed, reports hemorrhoids, denies history of PUD, currently taking Xarelto. PCP: Purvis Kilts, MD    The history is provided by the patient, a relative and medical records. No language interpreter was used.    Past Medical History  Diagnosis Date  . Hx of cholecystectomy t  . S/P hysterectomy t  . S/P lumbar fusion t  . Hypertension   . Atrial fib/flutter, transient     2D Echo 07/06/2010  EF of 60 to 65%   . SOB (shortness of breath) 03/14/2011  . CAD (coronary artery disease) 03/14/2011  . PAF (paroxysmal atrial fibrillation) 03/14/2011  . Anticoagulated 03/14/2011  . Infarction splenic 03/14/2011  . Bradycardia 03/14/2011  . GERD (gastroesophageal reflux disease)   . Hiatal hernia   . Heart murmur   . Arthritis   . Thyroid disease   . CVA (cerebral vascular accident)     02/28/2010  . Bleeding 12/12    rectal  . COPD (chronic obstructive pulmonary disease)     on xray  . Hyperlipidemia, mixed   . Carotid artery disease     Carotid duplex doppler 06/04/11   Past Surgical History  Procedure Laterality Date  . Coronary artery bypass graft      CABG x 4  05/28/96  Dr Prescott Gum  (left internal mammary artery to the left anterior descending saphenous vein graft to the diagonal, saphenous vein graft to obtuse marginal, saphenous vein graft to posterior descending  . Bladder suspension    . Cataract extraction w/ intraocular lens  implant, bilateral    . Tee without cardioversion  03/16/2011    Procedure: TRANSESOPHAGEAL ECHOCARDIOGRAM (TEE);  Surgeon: Pixie Casino;  Location: [facility];  Service: Cardiovascular;  Laterality: N/A;  . Colonoscopy  03/17/2011    Procedure: COLONOSCOPY;  Surgeon: Missy Sabins, MD;  Location: [facility];  Service: Endoscopy;  Laterality: N/A;  . Cholecystectomy    . Back surgery    . Abdominal hysterectomy  1974  . Eye surgery      both cataracts  . Cardiac catheterization      05/24/1996/Dr Lia Foyer  revealed 3 vessel coronary artery disease,90-95% LAD, 70-80% mid LAD, 70% diagonal, 80% circumflex, 60-7-% circumflex lesions, 60% proximal RCA as well as 50-70, 60% distal RCA, 90% proximal  PDA with normal LV function;  03/10/1999, 12/04/1998, 05/01/2002, 06/18/2003, 08/08/2008, 12/20/11, 07/21/10;    Family History  Problem Relation Age of Onset  . Cancer Mother     colon  . Cancer Son  colon   History  Substance Use Topics  . Smoking status: Never Smoker   . Smokeless tobacco: Never Used  . Alcohol Use: No   OB History   Grav Para Term Preterm Abortions TAB SAB Ect Mult Living                 Review of Systems  Constitutional: Negative for fever and chills.  Respiratory: Negative for cough.   Cardiovascular: Negative for leg swelling.  Gastrointestinal: Positive for nausea, abdominal pain and diarrhea. Negative for vomiting, constipation and blood in stool.  Genitourinary: Negative for dysuria.    Allergies  Celebrex; Ciprofloxacin hcl; Codeine;  Doxycycline; Famotidine; Levofloxacin; Macrolides and ketolides; Penicillins; Plavix; Warfarin sodium; and Amlodipine  Home Medications   Current Outpatient Rx  Name  Route  Sig  Dispense  Refill  . acetaminophen (TYLENOL) 650 MG CR tablet   Oral   Take 1,300 mg by mouth every 4 (four) hours as needed for pain.         Marland Kitchen aspirin EC 81 MG EC tablet   Oral   Take 1 tablet (81 mg total) by mouth daily.         Marland Kitchen Bioflavonoid Products (ESTER C PO)   Oral   Take 500 mg by mouth daily.         . cholecalciferol (VITAMIN D) 1000 UNITS tablet   Oral   Take 2,000 Units by mouth daily.           Marland Kitchen diltiazem (CARDIZEM) 60 MG tablet   Oral   Take 1 tablet (60 mg total) by mouth every 12 (twelve) hours.   60 tablet   3   . docusate sodium 100 MG CAPS   Oral   Take 100 mg by mouth every 12 (twelve) hours.   10 capsule   0   . ezetimibe (ZETIA) 10 MG tablet   Oral   Take 1 tablet (10 mg total) by mouth at bedtime.   90 tablet   3   . fluvastatin XL (LESCOL XL) 80 MG 24 hr tablet   Oral   Take 80 mg by mouth daily.           . furosemide (LASIX) 40 MG tablet   Oral   Take 20-40 mg by mouth every other day. Alternates 20 mg 1 day, then 40 mg 1 day, etc.         . isosorbide mononitrate (IMDUR) 120 MG 24 hr tablet   Oral   Take 1 tablet (120 mg total) by mouth daily.   90 tablet   3   . metoprolol tartrate (LOPRESSOR) 25 MG tablet   Oral   Take 0.5 tablets (12.5 mg total) by mouth 2 (two) times daily.   90 tablet   3   . Multiple Vitamin (MULITIVITAMIN WITH MINERALS) TABS   Oral   Take 1 tablet by mouth daily.         . nitroGLYCERIN (NITROSTAT) 0.4 MG SL tablet   Sublingual   Place 1 tablet (0.4 mg total) under the tongue every 5 (five) minutes x 3 doses as needed for chest pain.   25 tablet   1   . omega-3 acid ethyl esters (LOVAZA) 1 G capsule   Oral   Take 1 capsule (1 g total) by mouth daily.   90 capsule   3   . pantoprazole (PROTONIX) 40  MG tablet   Oral   Take 1 tablet (40 mg  total) by mouth daily.   90 tablet   3   . polyethylene glycol (MIRALAX / GLYCOLAX) packet   Oral   Take 17 g by mouth daily.           . polyvinyl alcohol (LIQUIFILM TEARS) 1.4 % ophthalmic solution   Both Eyes   Place 1 drop into both eyes at bedtime.   15 mL   0   . ranolazine (RANEXA) 500 MG 12 hr tablet   Oral   Take 1 tablet (500 mg total) by mouth 2 (two) times daily.   180 tablet   3   . Rivaroxaban (XARELTO) 15 MG TABS tablet   Oral   Take 15 mg by mouth daily.          Marland Kitchen zolpidem (AMBIEN) 10 MG tablet   Oral   Take 10 mg by mouth at bedtime.           . ondansetron (ZOFRAN ODT) 4 MG disintegrating tablet   Oral   Take 1 tablet (4 mg total) by mouth every 8 (eight) hours as needed for nausea or vomiting.   10 tablet   0    BP 141/61  Pulse 75  Temp(Src) 97.9 F (36.6 C)  Resp 16  SpO2 100% Physical Exam  Nursing note and vitals reviewed. Constitutional: She is oriented to person, place, and time. She appears well-developed and well-nourished. No distress.  HENT:  Head: Normocephalic and atraumatic.  Mouth/Throat: Uvula is midline. Mucous membranes are dry. No oropharyngeal exudate or posterior oropharyngeal edema.  Eyes: No scleral icterus.  Neck: Neck supple.  Cardiovascular: Normal rate.  An irregularly irregular rhythm present.  No lower extremity edema  Pulmonary/Chest: Effort normal and breath sounds normal. She has no wheezes. She has no rales.  Abdominal: Soft. She exhibits no distension. There is tenderness in the epigastric area. There is no rebound and no guarding.  Minimal tenderness to epigastric  Genitourinary: Rectal exam shows external hemorrhoid. Rectal exam shows no internal hemorrhoid.  No obvious blood on DRE. No stool in vault. Chaperone present  Neurological: She is alert and oriented to person, place, and time.  Skin: Skin is warm and dry.  Psychiatric: She has a normal mood and  affect.    ED Course  Procedures (including critical care time) Labs Review Labs Reviewed  COMPREHENSIVE METABOLIC PANEL - Abnormal; Notable for the following:    GFR calc non Af Amer 76 (*)    GFR calc Af Amer 88 (*)    All other components within normal limits  URINALYSIS, ROUTINE W REFLEX MICROSCOPIC - Abnormal; Notable for the following:    APPearance CLOUDY (*)    Ketones, ur 15 (*)    Leukocytes, UA TRACE (*)    All other components within normal limits  CBC  URINE MICROSCOPIC-ADD ON  OCCULT BLOOD, POC DEVICE   Imaging Review No results found.  EKG Interpretation   None       MDM   1. Gastritis    Pt with likely viral GI infection, has not vomited since 0200. History of dark watery stools will evaluate for GI bleed, fluids, pain medication, and nausea medication. Hemoccult-negative.  Re-eval: Pt reports pain has improved, nausea has resolved.  She is tolerating ice chips at this time. Discussed patient history, condition, and labs with Dr. Wilson Singer.  After his evaluation of the patient he agrees the patient can be followed up as an outpatient.  Re-eval patient reports symptom resolution and  has tolerated ice chips in the ED. Discussed lab results, and treatment plan with the patient and the patient's daughter. Return precautions given. Reports understanding and no other concerns at this time.  Patient is stable for discharge at this time.  Meds given in ED:  Medications  ondansetron (ZOFRAN) injection 4 mg (4 mg Intravenous Given 04/14/13 1517)  sodium chloride 0.9 % bolus 500 mL (0 mLs Intravenous Stopped 04/14/13 1619)  morphine 4 MG/ML injection 2 mg (2 mg Intravenous Given 04/14/13 1517)    Discharge Medication List as of 04/14/2013  4:36 PM    START taking these medications   Details  ondansetron (ZOFRAN ODT) 4 MG disintegrating tablet Take 1 tablet (4 mg total) by mouth every 8 (eight) hours as needed for nausea or vomiting., Starting 04/14/2013, Until Discontinued,  Print          Lorrine Kin, PA-C 04/15/13 1656

## 2013-04-14 NOTE — ED Notes (Signed)
Per pt sts she ate some chicken last night and since she has been vomit. sts generalized abdominal pain. sts she tried to eat some crackers this morning and still feeling sick.

## 2013-04-14 NOTE — Discharge Instructions (Signed)
Call for a follow up appointment with a Family or Primary Care Provider.  °Return if Symptoms worsen.   °Take medication as prescribed.  ° °

## 2013-04-17 ENCOUNTER — Telehealth: Payer: Self-pay | Admitting: Cardiovascular Disease

## 2013-04-17 NOTE — Telephone Encounter (Signed)
Returned call and pt verified x 2.  Pt informed message received and lab slip not needed as orders are in the computer.  Pt advised to fast prior to lab draw b/c her cholesterol is being checked.  Pt verbalized understanding and agreed w/ plan.

## 2013-04-17 NOTE — Telephone Encounter (Signed)
She have misplaced her lab order,would you please mail her another one.

## 2013-04-19 NOTE — ED Provider Notes (Signed)
Medical screening examination/treatment/procedure(s) were conducted as a shared visit with non-physician practitioner(s) and myself.  I personally evaluated the patient during the encounter.  EKG Interpretation   None      80 year old female with vomiting and diarrhea. Suspect viral illness. Some concern given patient's advanced age, she is otherwise very well appearing. Her abdominal exam is completely benign. She has not had any vomiting in several hours. Her ER workup is fairly reassuring. Had a very low suspicion for serious bacterial illness, significant metabolic arrangement for emergent surgical process. Plan symptomatic treatment at this time. Return precautions were discussed. Outpatient followup otherwise.  Virgel Manifold, MD 04/19/13 [phone]

## 2013-04-24 ENCOUNTER — Telehealth: Payer: Self-pay | Admitting: Cardiovascular Disease

## 2013-04-24 NOTE — Telephone Encounter (Signed)
Message forwarded to K. Vogel, RN to discuss w/ Dr. Berry.   

## 2013-04-24 NOTE — Telephone Encounter (Signed)
Want to know if she hold her blood thinner for five days after her procedure.She is having a lesion removed from her cheek. Also want to know how how many days she should stop before procedure?

## 2013-04-24 NOTE — Telephone Encounter (Signed)
Spoke with Dr. Constance Holster, pt has 2 basal cell carcinomas on face needs to have removed.  Explained to him our hesitance about holding Xarelto more than just 24 hrs prior to procedure.  Pt has "smoky" area in

## 2013-04-24 NOTE — Telephone Encounter (Signed)
Spoke with Ivin Booty, explained we needed info on bleeding risk for procedure.  She will have Dr. Constance Holster call with the details

## 2013-04-24 NOTE — Telephone Encounter (Signed)
Spoke with Dr. Constance Holster, pt has 2 basal cell carcinomas on face needs to have removed.  Explained to him our hesitance about holding Xarelto more than 24 hrs prior because of residual "smoke" in her left atrial appendage.  Reviewed kinetics of Xarelto, short half life.  Dr agreed that if okay with Dr. Gwenlyn Found she can have procedure done with only holding the one dose, restarting day of procedure.   Spoke with Dr. Kennon Holter RN Curt Bears - Dr Gwenlyn Found ok to hold for single dose.  Called information back to Dr. Janeice Robinson office - spoke with Lattie Haw.

## 2013-04-24 NOTE — Telephone Encounter (Signed)
Dr Gwenlyn Found reviewed the chart and she is taking xarelto.  He would like for Cathy Ortega to help manage the gap from holding the xarelto to the procedure and after the procedure.

## 2013-05-04 ENCOUNTER — Telehealth: Payer: Self-pay | Admitting: Cardiovascular Disease

## 2013-05-04 MED ORDER — NITROGLYCERIN 0.4 MG SL SUBL: 0.4000 mg | SUBLINGUAL_TABLET | SUBLINGUAL | Status: DC | PRN

## 2013-05-04 MED ORDER — NITROGLYCERIN 0.4 MG SL SUBL: 0.4000 mg | SUBLINGUAL_TABLET | SUBLINGUAL | Status: AC | PRN

## 2013-05-04 NOTE — Telephone Encounter (Signed)
Woke up this morning BP was high,also blood vessel had burst in her right eye. She is also having pressure in her chest.

## 2013-05-04 NOTE — Telephone Encounter (Signed)
Returned call and pt verified x 2.  Pt stated she woke up about 4am w/ chest pressure and HA.  Stated she stayed in bed and around 7 am was no better.  She got up to use the bathroom and saw that a blood vessel burst in her R eye.  Pt stated her BP has been high since "they started on the new medicine."  Pt stated she was started on four new meds in the hospital.  Pt found tablet w/ BP: 145/97 HR 91, 156/78 HR 90 and taken while on phone 159/97 HR 84.  RN asked pt to take NTG now.  Pt does not have NTG.  Tarri Fuller, PA-C notified and advised: if pt has not taken AM meds, take and recheck BP in 1-2 hours; if pt has taken meds, then go to ER r/t CP/pressure.  Pt informed and verbalized understanding.  Stated she took meds about 7-7:30am.  Pt did not want to go to ER.  Stated she just wanted to let us know how she felt.  Stated she is lying back in the recliner and will recheck her BP ever so often.  RN advised pt to recheck BP around 10 am and to have someone pick up NTG Rx sent to pharmacy.  Pt verbalized understanding and agreed w/ plan.  Pt will have daughter pick up NTG.    RN will call pt back aroun 10:30am.  Refill(s) sent to pharmacy: [facility].

## 2013-05-04 NOTE — Telephone Encounter (Signed)
Call to check on pt.  Pt stated her BP came down and her daughter got the NTG.  Stated BP is 131/63 HR 71 and the NTG eased off her CP.  Pt stated she is going to take it easy.  No further action taken.

## 2013-05-07 ENCOUNTER — Telehealth: Payer: Self-pay | Admitting: Cardiovascular Disease

## 2013-05-07 MED ORDER — DILTIAZEM HCL 60 MG PO TABS: 60.0000 mg | ORAL_TABLET | Freq: Two times a day (BID) | ORAL | Status: DC

## 2013-05-07 NOTE — Telephone Encounter (Signed)
Diltiazem refilled electronically [facility] #90 x 3 refills.

## 2013-05-07 NOTE — Telephone Encounter (Signed)
Diltiazem refilled electronically #90 x 3 refills.

## 2013-05-07 NOTE — Telephone Encounter (Signed)
Need  her Diltiazem 60 mg called to [facility],she need #90

## 2013-05-08 ENCOUNTER — Encounter (HOSPITAL_COMMUNITY): Payer: Medicare Other

## 2013-05-08 ENCOUNTER — Other Ambulatory Visit (HOSPITAL_COMMUNITY): Payer: Self-pay | Admitting: Cardiovascular Disease

## 2013-05-08 ENCOUNTER — Other Ambulatory Visit: Payer: Self-pay | Admitting: Otolaryngology

## 2013-05-08 DIAGNOSIS — I6529 Occlusion and stenosis of unspecified carotid artery: Secondary | ICD-10-CM

## 2013-05-15 ENCOUNTER — Ambulatory Visit (HOSPITAL_COMMUNITY)
Admission: RE | Admit: 2013-05-15 | Discharge: 2013-05-15 | Disposition: A | Discharge status: Home / Self Care | Payer: Medicare Other | Source: Ambulatory Visit | Attending: Internal Medicine | Admitting: Internal Medicine

## 2013-05-15 DIAGNOSIS — I6529 Occlusion and stenosis of unspecified carotid artery: Secondary | ICD-10-CM | POA: Insufficient documentation

## 2013-05-15 NOTE — Progress Notes (Signed)
Carotid Duplex completed. Kwabena Strutz, BS, RDMS, RVT  

## 2013-05-18 ENCOUNTER — Encounter (HOSPITAL_COMMUNITY): Payer: Self-pay | Admitting: Emergency Medicine

## 2013-05-18 ENCOUNTER — Emergency Department (HOSPITAL_COMMUNITY): Payer: Medicare Other

## 2013-05-18 ENCOUNTER — Emergency Department (HOSPITAL_COMMUNITY)
Admission: EM | Admit: 2013-05-18 | Discharge: 2013-05-18 | Disposition: A | Discharge status: Home / Self Care | Payer: Medicare Other | Attending: Emergency Medicine | Admitting: Emergency Medicine

## 2013-05-18 DIAGNOSIS — K219 Gastro-esophageal reflux disease without esophagitis: Secondary | ICD-10-CM | POA: Insufficient documentation

## 2013-05-18 DIAGNOSIS — J4489 Other specified chronic obstructive pulmonary disease: Secondary | ICD-10-CM | POA: Insufficient documentation

## 2013-05-18 DIAGNOSIS — Z862 Personal history of diseases of the blood and blood-forming organs and certain disorders involving the immune mechanism: Secondary | ICD-10-CM | POA: Insufficient documentation

## 2013-05-18 DIAGNOSIS — J449 Chronic obstructive pulmonary disease, unspecified: Secondary | ICD-10-CM | POA: Insufficient documentation

## 2013-05-18 DIAGNOSIS — Z8673 Personal history of transient ischemic attack (TIA), and cerebral infarction without residual deficits: Secondary | ICD-10-CM | POA: Insufficient documentation

## 2013-05-18 DIAGNOSIS — R011 Cardiac murmur, unspecified: Secondary | ICD-10-CM | POA: Insufficient documentation

## 2013-05-18 DIAGNOSIS — N390 Urinary tract infection, site not specified: Secondary | ICD-10-CM

## 2013-05-18 DIAGNOSIS — E782 Mixed hyperlipidemia: Secondary | ICD-10-CM | POA: Insufficient documentation

## 2013-05-18 DIAGNOSIS — Z872 Personal history of diseases of the skin and subcutaneous tissue: Secondary | ICD-10-CM | POA: Insufficient documentation

## 2013-05-18 DIAGNOSIS — J069 Acute upper respiratory infection, unspecified: Secondary | ICD-10-CM

## 2013-05-18 DIAGNOSIS — I2510 Atherosclerotic heart disease of native coronary artery without angina pectoris: Secondary | ICD-10-CM | POA: Insufficient documentation

## 2013-05-18 DIAGNOSIS — I4891 Unspecified atrial fibrillation: Secondary | ICD-10-CM | POA: Insufficient documentation

## 2013-05-18 DIAGNOSIS — I10 Essential (primary) hypertension: Secondary | ICD-10-CM | POA: Insufficient documentation

## 2013-05-18 DIAGNOSIS — Z951 Presence of aortocoronary bypass graft: Secondary | ICD-10-CM | POA: Insufficient documentation

## 2013-05-18 DIAGNOSIS — Z7982 Long term (current) use of aspirin: Secondary | ICD-10-CM | POA: Insufficient documentation

## 2013-05-18 DIAGNOSIS — M129 Arthropathy, unspecified: Secondary | ICD-10-CM | POA: Insufficient documentation

## 2013-05-18 DIAGNOSIS — Z79899 Other long term (current) drug therapy: Secondary | ICD-10-CM | POA: Insufficient documentation

## 2013-05-18 HISTORY — DX: Other benign neoplasm of skin, unspecified: D23.9

## 2013-05-18 LAB — URINALYSIS, ROUTINE W REFLEX MICROSCOPIC
BILIRUBIN URINE: NEGATIVE
Glucose, UA: NEGATIVE mg/dL
Hgb urine dipstick: NEGATIVE
KETONES UR: NEGATIVE mg/dL
Nitrite: NEGATIVE
PH: 7.5 (ref 5.0–8.0)
Protein, ur: NEGATIVE mg/dL
Specific Gravity, Urine: 1.009 (ref 1.005–1.030)
Urobilinogen, UA: 0.2 mg/dL (ref 0.0–1.0)

## 2013-05-18 LAB — CBC WITH DIFFERENTIAL/PLATELET
BASOS ABS: 0 10*3/uL (ref 0.0–0.1)
BASOS PCT: 1 % (ref 0–1)
EOS ABS: 0.1 10*3/uL (ref 0.0–0.7)
EOS PCT: 1 % (ref 0–5)
HCT: 41.3 % (ref 36.0–46.0)
Hemoglobin: 14.3 g/dL (ref 12.0–15.0)
Lymphocytes Relative: 26 % (ref 12–46)
Lymphs Abs: 1 10*3/uL (ref 0.7–4.0)
MCH: 30.8 pg (ref 26.0–34.0)
MCHC: 34.6 g/dL (ref 30.0–36.0)
MCV: 89 fL (ref 78.0–100.0)
MONO ABS: 0.6 10*3/uL (ref 0.1–1.0)
MONOS PCT: 16 % — AB (ref 3–12)
Neutro Abs: 2.2 10*3/uL (ref 1.7–7.7)
Neutrophils Relative %: 57 % (ref 43–77)
Platelets: 237 10*3/uL (ref 150–400)
RBC: 4.64 MIL/uL (ref 3.87–5.11)
RDW: 13.3 % (ref 11.5–15.5)
WBC: 3.9 10*3/uL — ABNORMAL LOW (ref 4.0–10.5)

## 2013-05-18 LAB — BASIC METABOLIC PANEL
BUN: 7 mg/dL (ref 6–23)
CHLORIDE: 99 meq/L (ref 96–112)
CO2: 25 mEq/L (ref 19–32)
Calcium: 9.6 mg/dL (ref 8.4–10.5)
Creatinine, Ser: 0.72 mg/dL (ref 0.50–1.10)
GFR calc non Af Amer: 74 mL/min — ABNORMAL LOW (ref >90)
GFR, EST AFRICAN AMERICAN: 86 mL/min — AB (ref >90)
Glucose, Bld: 145 mg/dL — ABNORMAL HIGH (ref 70–99)
Potassium: 4.3 mEq/L (ref 3.7–5.3)
Sodium: 140 mEq/L (ref 137–147)

## 2013-05-18 LAB — POCT I-STAT TROPONIN I: Troponin i, poc: 0.01 ng/mL (ref 0.00–0.08)

## 2013-05-18 LAB — URINE MICROSCOPIC-ADD ON

## 2013-05-18 MED ORDER — CEPHALEXIN 500 MG PO CAPS: 500.0000 mg | ORAL_CAPSULE | Freq: Four times a day (QID) | ORAL | Status: DC

## 2013-05-18 MED ORDER — SODIUM CHLORIDE 0.9 % IV BOLUS (SEPSIS)
500.0000 mL | Freq: Once | INTRAVENOUS | Status: AC
  Administered 2013-05-18: 500 mL via INTRAVENOUS

## 2013-05-18 NOTE — ED Notes (Signed)
PT reports sneezing late Tues and onset of cough/congestion Weds AM. PT reports some diarrhea and not being able to sleep b/c of cough. Reports yellow sputum and low grade fever at home. Took tylenol 1800 Thurs PM -no fever at present

## 2013-05-18 NOTE — ED Notes (Signed)
Patient transported to X-ray 

## 2013-05-18 NOTE — ED Notes (Signed)
Pt undressed, in gown, bp cuff and pulse ox continuous, family at bedside, warm blanket given

## 2013-05-18 NOTE — Discharge Instructions (Signed)
Cough, Adult  A cough is a reflex that helps clear your throat and airways. It can help heal the body or may be a reaction to an irritated airway. A cough may only last 2 or 3 weeks (acute) or may last more than 8 weeks (chronic).  CAUSES Acute cough:  Viral or bacterial infections. Chronic cough:  Infections.  Allergies.  Asthma.  Post-nasal drip.  Smoking.  Heartburn or acid reflux.  Some medicines.  Chronic lung problems (COPD).  Cancer. SYMPTOMS   Cough.  Fever.  Chest pain.  Increased breathing rate.  High-pitched whistling sound when breathing (wheezing).  Colored mucus that you cough up (sputum). TREATMENT   A bacterial cough may be treated with antibiotic medicine.  A viral cough must run its course and will not respond to antibiotics.  Your caregiver may recommend other treatments if you have a chronic cough. HOME CARE INSTRUCTIONS   Only take over-the-counter or prescription medicines for pain, discomfort, or fever as directed by your caregiver. Use cough suppressants only as directed by your caregiver.  Use a cold steam vaporizer or humidifier in your bedroom or home to help loosen secretions.  Sleep in a semi-upright position if your cough is worse at night.  Rest as needed.  Stop smoking if you smoke. SEEK IMMEDIATE MEDICAL CARE IF:   You have pus in your sputum.  Your cough starts to worsen.  You cannot control your cough with suppressants and are losing sleep.  You begin coughing up blood.  You have difficulty breathing.  You develop pain which is getting worse or is uncontrolled with medicine.  You have a fever. MAKE SURE YOU:   Understand these instructions.  Will watch your condition.  Will get help right away if you are not doing well or get worse. Document Released: 09/25/2010 Document Revised: 06/21/2011 Document Reviewed: 09/25/2010 [facility] Patient Information 2014 [place].  Upper Respiratory Infection,  Adult An upper respiratory infection (URI) is also sometimes known as the common cold. The upper respiratory tract includes the nose, sinuses, throat, trachea, and bronchi. Bronchi are the airways leading to the lungs. Most people improve within 1 week, but symptoms can last up to 2 weeks. A residual cough may last even longer.  CAUSES Many different viruses can infect the tissues lining the upper respiratory tract. The tissues become irritated and inflamed and often become very moist. Mucus production is also common. A cold is contagious. You can easily spread the virus to others by oral contact. This includes kissing, sharing a glass, coughing, or sneezing. Touching your mouth or nose and then touching a surface, which is then touched by another person, can also spread the virus. SYMPTOMS  Symptoms typically develop 1 to 3 days after you come in contact with a cold virus. Symptoms vary from person to person. They may include:  Runny nose.  Sneezing.  Nasal congestion.  Sinus irritation.  Sore throat.  Loss of voice (laryngitis).  Cough.  Fatigue.  Muscle aches.  Loss of appetite.  Headache.  Low-grade fever. DIAGNOSIS  You might diagnose your own cold based on familiar symptoms, since most people get a cold 2 to 3 times a year. Your caregiver can confirm this based on your exam. Most importantly, your caregiver can check that your symptoms are not due to another disease such as strep throat, sinusitis, pneumonia, asthma, or epiglottitis. Blood tests, throat tests, and X-rays are not necessary to diagnose a common cold, but they may sometimes be helpful  in excluding other more serious diseases. Your caregiver will decide if any further tests are required. RISKS AND COMPLICATIONS  You may be at risk for a more severe case of the common cold if you smoke cigarettes, have chronic heart disease (such as heart failure) or lung disease (such as asthma), or if you have a weakened immune  system. The very young and very old are also at risk for more serious infections. Bacterial sinusitis, middle ear infections, and bacterial pneumonia can complicate the common cold. The common cold can worsen asthma and chronic obstructive pulmonary disease (COPD). Sometimes, these complications can require emergency medical care and may be life-threatening. PREVENTION  The best way to protect against getting a cold is to practice good hygiene. Avoid oral or hand contact with people with cold symptoms. Wash your hands often if contact occurs. There is no clear evidence that vitamin C, vitamin E, echinacea, or exercise reduces the chance of developing a cold. However, it is always recommended to get plenty of rest and practice good nutrition. TREATMENT  Treatment is directed at relieving symptoms. There is no cure. Antibiotics are not effective, because the infection is caused by a virus, not by bacteria. Treatment may include:  Increased fluid intake. Sports drinks offer valuable electrolytes, sugars, and fluids.  Breathing heated mist or steam (vaporizer or shower).  Eating chicken soup or other clear broths, and maintaining good nutrition.  Getting plenty of rest.  Using gargles or lozenges for comfort.  Controlling fevers with ibuprofen or acetaminophen as directed by your caregiver.  Increasing usage of your inhaler if you have asthma. Zinc gel and zinc lozenges, taken in the first 24 hours of the common cold, can shorten the duration and lessen the severity of symptoms. Pain medicines may help with fever, muscle aches, and throat pain. A variety of non-prescription medicines are available to treat congestion and runny nose. Your caregiver can make recommendations and may suggest nasal or lung inhalers for other symptoms.  HOME CARE INSTRUCTIONS   Only take over-the-counter or prescription medicines for pain, discomfort, or fever as directed by your caregiver.  Use a warm mist humidifier  or inhale steam from a shower to increase air moisture. This may keep secretions moist and make it easier to breathe.  Drink enough water and fluids to keep your urine clear or pale yellow.  Rest as needed.  Return to work when your temperature has returned to normal or as your caregiver advises. You may need to stay home longer to avoid infecting others. You can also use a face mask and careful hand washing to prevent spread of the virus. SEEK MEDICAL CARE IF:   After the first few days, you feel you are getting worse rather than better.  You need your caregiver's advice about medicines to control symptoms.  You develop chills, worsening shortness of breath, or brown or red sputum. These may be signs of pneumonia.  You develop yellow or brown nasal discharge or pain in the face, especially when you bend forward. These may be signs of sinusitis.  You develop a fever, swollen neck glands, pain with swallowing, or white areas in the back of your throat. These may be signs of strep throat. SEEK IMMEDIATE MEDICAL CARE IF:   You have a fever.  You develop severe or persistent headache, ear pain, sinus pain, or chest pain.  You develop wheezing, a prolonged cough, cough up blood, or have a change in your usual mucus (if you have chronic  lung disease).  You develop sore muscles or a stiff neck. Document Released: 09/22/2000 Document Revised: 06/21/2011 Document Reviewed: 07/31/2010 [facility] Patient Information 2014 [place], [place].  Urinary Tract Infection Urinary tract infections (UTIs) can develop anywhere along your urinary tract. Your urinary tract is your body's drainage system for removing wastes and extra water. Your urinary tract includes two kidneys, two ureters, a bladder, and a urethra. Your kidneys are a pair of bean-shaped organs. Each kidney is about the size of your fist. They are located below your ribs, one on each side of your spine. CAUSES Infections are caused by  microbes, which are microscopic organisms, including fungi, viruses, and bacteria. These organisms are so small that they can only be seen through a microscope. Bacteria are the microbes that most commonly cause UTIs. SYMPTOMS  Symptoms of UTIs may vary by age and gender of the patient and by the location of the infection. Symptoms in young women typically include a frequent and intense urge to urinate and a painful, burning feeling in the bladder or urethra during urination. Older women and men are more likely to be tired, shaky, and weak and have muscle aches and abdominal pain. A fever may mean the infection is in your kidneys. Other symptoms of a kidney infection include pain in your back or sides below the ribs, nausea, and vomiting. DIAGNOSIS To diagnose a UTI, your caregiver will ask you about your symptoms. Your caregiver also will ask to provide a urine sample. The urine sample will be tested for bacteria and white blood cells. White blood cells are made by your body to help fight infection. TREATMENT  Typically, UTIs can be treated with medication. Because most UTIs are caused by a bacterial infection, they usually can be treated with the use of antibiotics. The choice of antibiotic and length of treatment depend on your symptoms and the type of bacteria causing your infection. HOME CARE INSTRUCTIONS  If you were prescribed antibiotics, take them exactly as your caregiver instructs you. Finish the medication even if you feel better after you have only taken some of the medication.  Drink enough water and fluids to keep your urine clear or pale yellow.  Avoid caffeine, tea, and carbonated beverages. They tend to irritate your bladder.  Empty your bladder often. Avoid holding urine for long periods of time.  Empty your bladder before and after sexual intercourse.  After a bowel movement, women should cleanse from front to back. Use each tissue only once. SEEK MEDICAL CARE IF:   You have  back pain.  You develop a fever.  Your symptoms do not begin to resolve within 3 days. SEEK IMMEDIATE MEDICAL CARE IF:   You have severe back pain or lower abdominal pain.  You develop chills.  You have nausea or vomiting.  You have continued burning or discomfort with urination. MAKE SURE YOU:   Understand these instructions.  Will watch your condition.  Will get help right away if you are not doing well or get worse. Document Released: 01/06/2005 Document Revised: 09/28/2011 Document Reviewed: 05/07/2011 [facility] Patient Information 2014 [place].    You may take Tylenol 650 mg every 6 hours as needed for fever and pain. You may also take over-the-counter Mucinex with guaifenesin for your cough and congestion. I recommend avoiding medications that contain pseudoephedrine, phenylephrine, dextromethorphan as they may elevate your blood pressure and heart rate. If you develop any worsening symptoms, been feeling short of breath, worsening or different chest pain, vomiting and  cannot keep down fluids, severe headache with neck pain or neck stiffness, please return to the emergency department.  Please follow up with your primary care physician next week for reevaluation. We're discharging you home with antibiotics to help for your urinary tract infection.

## 2013-05-18 NOTE — ED Provider Notes (Signed)
TIME SEEN: 7:20 AM  CHIEF COMPLAINT: Cough, nasal congestion, low-grade fever  HPI: Patient is an 80 year old female with a history of coronary artery disease status post CABG, paroxysmal atrial fibrillation, hyperlipidemia, hypothyroidism, prior CVA who presents the emergency department with 3 days of nasal congestion and productive cough with brown sputum and low-grade fever no higher than 99.9. She has had some mild diarrhea but no nausea or vomiting. No rash. No headache neck pain or neck stiffness. No recent travel or sick contacts. She has had influenza vaccination as well as a pneumococcal vaccination. She denies a history of COPD or asthma although prior x-rays as showing changes consistent with COPD. She's never been a smoker. She states she's having pain in her bilateral ribs due to coughing. She describes the pain as a soreness without radiation that is mild and persistent for the last 2 days. Worse with coughing. Not exertional or pleuritic. No alleviating factors. No shortness of breath. No lower extremity swelling or pain.  ROS: See HPI Constitutional: no fever  Eyes: no drainage  ENT: runny nose   Cardiovascular:  chest pain  Resp: no SOB  GI: no vomiting GU: no dysuria Integumentary: no rash  Allergy: no hives  Musculoskeletal: no leg swelling  Neurological: no slurred speech ROS otherwise negative  PAST MEDICAL HISTORY/PAST SURGICAL HISTORY:  Past Medical History  Diagnosis Date  . Hx of cholecystectomy t  . S/P hysterectomy t  . S/P lumbar fusion t  . Hypertension   . Atrial fib/flutter, transient     2D Echo 07/06/2010  EF of 60 to 65%   . SOB (shortness of breath) 03/14/2011  . CAD (coronary artery disease) 03/14/2011  . PAF (paroxysmal atrial fibrillation) 03/14/2011  . Anticoagulated 03/14/2011  . Infarction splenic 03/14/2011  . Bradycardia 03/14/2011  . GERD (gastroesophageal reflux disease)   . Hiatal hernia   . Heart murmur   . Arthritis   . Thyroid disease    . CVA (cerebral vascular accident)     02/28/2010  . Bleeding 12/12    rectal  . COPD (chronic obstructive pulmonary disease)     on xray  . Hyperlipidemia, mixed   . Carotid artery disease     Carotid duplex doppler 06/04/11    MEDICATIONS:  Prior to Admission medications   Medication Sig Start Date End Date Taking? Authorizing Provider  acetaminophen (TYLENOL) 650 MG CR tablet Take 1,300 mg by mouth every 4 (four) hours as needed for pain.    Historical Provider, MD  aspirin EC 81 MG EC tablet Take 1 tablet (81 mg total) by mouth daily. 03/03/13   Cecilie Kicks, NP  Bioflavonoid Products (ESTER C PO) Take 500 mg by mouth daily.    Historical Provider, MD  cholecalciferol (VITAMIN D) 1000 UNITS tablet Take 2,000 Units by mouth daily.      Historical Provider, MD  diltiazem (CARDIZEM) 60 MG tablet Take 1 tablet (60 mg total) by mouth every 12 (twelve) hours. 05/07/13   Lorretta Harp, MD  docusate sodium 100 MG CAPS Take 100 mg by mouth every 12 (twelve) hours. 03/03/13   Cecilie Kicks, NP  ezetimibe (ZETIA) 10 MG tablet Take 1 tablet (10 mg total) by mouth at bedtime. 04/10/13   Lorretta Harp, MD  fluvastatin XL (LESCOL XL) 80 MG 24 hr tablet Take 80 mg by mouth daily.      Historical Provider, MD  furosemide (LASIX) 40 MG tablet Take 20-40 mg by mouth every other day.  Alternates 20 mg 1 day, then 40 mg 1 day, etc.    Historical Provider, MD  isosorbide mononitrate (IMDUR) 120 MG 24 hr tablet Take 1 tablet (120 mg total) by mouth daily. 10/02/12   Lorretta Harp, MD  metoprolol tartrate (LOPRESSOR) 25 MG tablet Take 0.5 tablets (12.5 mg total) by mouth 2 (two) times daily. 04/10/13   Lorretta Harp, MD  Multiple Vitamin (MULITIVITAMIN WITH MINERALS) TABS Take 1 tablet by mouth daily.    Historical Provider, MD  nitroGLYCERIN (NITROSTAT) 0.4 MG SL tablet Place 1 tablet (0.4 mg total) under the tongue every 5 (five) minutes x 3 doses as needed for chest pain. 05/04/13 05/04/14  Lorretta Harp, MD  omega-3 acid ethyl esters (LOVAZA) 1 G capsule Take 1 capsule (1 g total) by mouth daily. 04/10/13   Lorretta Harp, MD  ondansetron (ZOFRAN ODT) 4 MG disintegrating tablet Take 1 tablet (4 mg total) by mouth every 8 (eight) hours as needed for nausea or vomiting. 04/14/13   Lauren Burnetta Sabin, PA-C  pantoprazole (PROTONIX) 40 MG tablet Take 1 tablet (40 mg total) by mouth daily. 10/02/12   Lorretta Harp, MD  polyethylene glycol [facility] Floria [facility]aveling) packet Take 17 g by mouth daily.      Historical Provider, MD  polyvinyl alcohol (LIQUIFILM TEARS) 1.4 % ophthalmic solution Place 1 drop into both eyes at bedtime. 03/03/13   Cecilie Kicks, NP  ranolazine (RANEXA) 500 MG 12 hr tablet Take 1 tablet (500 mg total) by mouth 2 (two) times daily. 04/10/13   Lorretta Harp, MD  Rivaroxaban (XARELTO) 15 MG TABS tablet Take 15 mg by mouth daily.     Historical Provider, MD  zolpidem (AMBIEN) 10 MG tablet Take 10 mg by mouth at bedtime.      Historical Provider, MD    ALLERGIES:  Allergies  Allergen Reactions  . Celebrex [Celecoxib] Other (See Comments)    Unknown   . Ciprofloxacin Hcl Other (See Comments)    unknown  . Codeine Other (See Comments)    unknown  . Doxycycline Other (See Comments)    unknown  . Famotidine Other (See Comments)    unknown  . Levofloxacin Other (See Comments)    unknown  . Macrolides And Ketolides Other (See Comments)    unknown  . Penicillins Other (See Comments)    Unknown; possible cross reactor with eggs.   . Plavix [Clopidogrel Bisulfate] Other (See Comments)    Unknown   . Warfarin Sodium Other (See Comments)    Makes my skin bleed and also caused internal bleeding  . Amlodipine Rash    SOCIAL HISTORY:  History  Substance Use Topics  . Smoking status: Never Smoker   . Smokeless tobacco: Never Used  . Alcohol Use: No    FAMILY HISTORY: Family History  Problem Relation Age of Onset  . Cancer Mother     colon  . Cancer Son     colon     EXAM: BP 192/84  Pulse 96  Temp(Src) 97.8 F (36.6 C) (Oral)  Resp 16  SpO2 98% CONSTITUTIONAL: Alert and oriented and responds appropriately to questions. Well-appearing; well-nourished nontoxic, well-hydrated, very pleasant HEAD: Normocephalic EYES: Conjunctivae clear, PERRL ENT: normal nose; no rhinorrhea; moist mucous membranes; pharynx without lesions noted NECK: Supple, no meningismus, no LAD  CARD: Irregularly irregular; S1 and S2 appreciated; no murmurs, no clicks, no rubs, no gallops RESP: Normal chest excursion without splinting or tachypnea; breath sounds clear  and equal bilaterally; no wheezes, no rhonchi, no rales, very slightly diminished in her bases ABD/GI: Normal bowel sounds; non-distended; soft, non-tender, no rebound, no guarding BACK:  The back appears normal and is non-tender to palpation, there is no CVA tenderness EXT: Normal ROM in all joints; non-tender to palpation; no edema; normal capillary refill; no cyanosis    SKIN: Normal color for age and race; warm NEURO: Moves all extremities equally, sensation to light touch intact diffusely, cranial nerves II through XII intact PSYCH: The patient's mood and manner are appropriate. Grooming and personal hygiene are appropriate.  MEDICAL DECISION MAKING: Patient here with likely viral URI. Family is concerned she may have pneumonia she has had in the past. She is hemodynamically stable with no respiratory distress or increased work of breathing or hypoxia. Will obtain cardiac labs although my suspicion is her chest soreness is secondary to coughing rather than ACS. We'll also obtain chest x-ray, urinalysis. If workup unremarkable and patient is hemodynamically stable, anticipate discharge home. Her PCP is at [facility] in [place].  ED PROGRESS: Patient's labs are unremarkable other than mild leukopenia which is consistent with viral illness. She is still well-appearing, no distress, normal vital signs. Chest x-ray  shows no obvious pneumonia. Urine does show leukocytes and bacteria. Culture pending. Given patient is still well-appearing, I feel she can be discharged home with close outpatient followup. We'll discharge home with Keflex for UTI. Given return precautions. Patient and daughter at bedside verbalize understanding and are comfortable plan.    EKG Interpretation    Date/Time:  Friday May 18 2013 07:45:06 EST Ventricular Rate:  93 PR Interval:    QRS Duration: 76 QT Interval:  347 QTC Calculation: 432 R Axis:   141 Text Interpretation:  Atrial fibrillation Probable right ventricular hypertrophy No significant change since last tracing Mar 01 2013 Confirmed by Daquavion Catala  DO, Haig Gerardo [phone]) on 05/18/2013 7:47:55 AM               [place], DO 05/18/13 [phone]

## 2013-05-18 NOTE — ED Notes (Signed)
Pt back in room, on monitor, continuous bp cuff and pulse ox

## 2013-05-18 NOTE — ED Notes (Signed)
Water given, per Ward, MD

## 2013-05-19 LAB — URINE CULTURE

## 2013-06-06 LAB — LIPID PANEL
Cholesterol: 187 mg/dL (ref 0–200)
HDL: 57 mg/dL (ref >39)
LDL Cholesterol: 94 mg/dL (ref 0–99)
Total CHOL/HDL Ratio: 3.3 Ratio
Triglycerides: 179 mg/dL — ABNORMAL HIGH (ref <150)
VLDL: 36 mg/dL (ref 0–40)

## 2013-06-25 ENCOUNTER — Other Ambulatory Visit (HOSPITAL_COMMUNITY): Payer: Self-pay | Admitting: Family Medicine

## 2013-06-25 ENCOUNTER — Ambulatory Visit (HOSPITAL_COMMUNITY)
Admission: RE | Admit: 2013-06-25 | Discharge: 2013-06-25 | Disposition: A | Discharge status: Home / Self Care | Payer: Medicare Other | Source: Ambulatory Visit | Attending: Family Medicine | Admitting: Family Medicine

## 2013-06-25 DIAGNOSIS — J209 Acute bronchitis, unspecified: Secondary | ICD-10-CM

## 2013-06-25 DIAGNOSIS — R05 Cough: Secondary | ICD-10-CM | POA: Insufficient documentation

## 2013-06-25 DIAGNOSIS — R059 Cough, unspecified: Secondary | ICD-10-CM | POA: Insufficient documentation

## 2013-06-25 DIAGNOSIS — Z951 Presence of aortocoronary bypass graft: Secondary | ICD-10-CM | POA: Insufficient documentation

## 2013-06-25 DIAGNOSIS — R0989 Other specified symptoms and signs involving the circulatory and respiratory systems: Secondary | ICD-10-CM | POA: Insufficient documentation

## 2013-07-03 ENCOUNTER — Encounter: Payer: Self-pay | Admitting: Cardiology

## 2013-07-03 ENCOUNTER — Ambulatory Visit (INDEPENDENT_AMBULATORY_CARE_PROVIDER_SITE_OTHER): Payer: Medicare Other | Admitting: Cardiology

## 2013-07-03 VITALS — BP 168/80 | HR 92 | Ht 62.0 in | Wt 111.0 lb

## 2013-07-03 DIAGNOSIS — J40 Bronchitis, not specified as acute or chronic: Secondary | ICD-10-CM

## 2013-07-03 DIAGNOSIS — Z7901 Long term (current) use of anticoagulants: Secondary | ICD-10-CM

## 2013-07-03 DIAGNOSIS — I6529 Occlusion and stenosis of unspecified carotid artery: Secondary | ICD-10-CM

## 2013-07-03 DIAGNOSIS — I4891 Unspecified atrial fibrillation: Secondary | ICD-10-CM

## 2013-07-03 DIAGNOSIS — I2510 Atherosclerotic heart disease of native coronary artery without angina pectoris: Secondary | ICD-10-CM

## 2013-07-03 DIAGNOSIS — E785 Hyperlipidemia, unspecified: Secondary | ICD-10-CM

## 2013-07-03 DIAGNOSIS — I10 Essential (primary) hypertension: Secondary | ICD-10-CM

## 2013-07-03 DIAGNOSIS — I482 Chronic atrial fibrillation, unspecified: Secondary | ICD-10-CM

## 2013-07-03 MED ORDER — DILTIAZEM HCL 60 MG PO TABS: 60.0000 mg | ORAL_TABLET | Freq: Three times a day (TID) | ORAL | Status: DC

## 2013-07-03 NOTE — Patient Instructions (Signed)
In the afternoon if you have chest pressure take an extra 60 mg of isosorbide mono. (half a tab)  Increase cardizem to 60 mg every 8 hours.  Follow up with Dr. Gwenlyn Found in 3 months.  Call if any problems.

## 2013-07-03 NOTE — Progress Notes (Signed)
07/06/2013   PCP: Purvis Kilts, MD   Chief Complaint  Patient presents with  . Follow-up    chest congestion 3 month visit    Primary Cardiologist: Dr. Gwenlyn Found  HPI:  80 year old thin and frail appearing widowed Caucasian female mother of 3 who is accompanied by one of her daughters 02/28/13. She was last seen by Dr. Gwenlyn Found 3 months ago. She has a history of CAD status post coronary artery bypass grafting in 1998. She was catheterized in 2000, 2005, 2010, 2012 and most recently December 20, 2011 revealing intact vein grafts, an atretic LIMA with normal LV function and 2+ MR. She did have a 95% LAD lesion beyond an occluded proximal LAD which was filled retrograde by a diagonal branch vein graft. She has chronic atrial fibrillation on Xarelto. She had unsuccessful attempts at cardioversion because of residual "smoke" in her left atrial appendage. Her other problems include hypertension and hyperlipidemia.     She underwent cath 02/27/13 for increasing chest pain and SOB, revealing :  patent vein grafts with anatomy that is similar to her prior cath for all intents and purposes. She does have a small vein graft to the PDA 70% segmental proximal stenosis, patent vein graft to the OM branch and to the diagonal branch which filled the LAD retrograde. There is a stenosis in the LAD beyond the diagonal branch takeoff this is a prior cath as well. Her LIMA is known to be atretic.  The next morning she had Lexiscan myoview and it was negative for ischemia.  Her BB was decreased due to bradycardia.  Ranexa had been added at that time.    She is here today for cardiology follow up.  She and her daughter with whom she lives, have recently have flu and bronchitis. She was seen in the ER in Feb. For this.  She is completing Tamiflu.  Still with some cough but improving.  Occ pm chest pain relief with NTG, no SOB.    Last lipid check 06/06/13  T Chol 187, TG 179, HDL 57 and LDL 94 which is  improved from 120-- 4 months ago.  This is not at goal but with her age I would not push.     Allergies  Allergen Reactions  . Celebrex [Celecoxib] Other (See Comments)    Unknown   . Ciprofloxacin Hcl Other (See Comments)    unknown  . Codeine Other (See Comments)    unknown  . Doxycycline Other (See Comments)    unknown  . Famotidine Other (See Comments)    unknown  . Levofloxacin Other (See Comments)    unknown  . Macrolides And Ketolides Other (See Comments)    unknown  . Penicillins Other (See Comments)    Unknown; possible cross reactor with eggs.   . Plavix [Clopidogrel Bisulfate] Other (See Comments)    Unknown   . Warfarin Sodium Other (See Comments)    Makes my skin bleed and also caused internal bleeding  . Amlodipine Rash    Current Outpatient Prescriptions  Medication Sig Dispense Refill  . acetaminophen (TYLENOL) 650 MG CR tablet Take 1,300 mg by mouth every 4 (four) hours as needed for pain.      Marland Kitchen azithromycin (ZITHROMAX) 250 MG tablet       . Bioflavonoid Products (ESTER C PO) Take 500 mg by mouth daily.      . cholecalciferol (VITAMIN D) 1000 UNITS tablet Take 2,000 Units by  mouth daily.        Marland Kitchen docusate sodium 100 MG CAPS Take 100 mg by mouth every 12 (twelve) hours.  10 capsule  0  . ezetimibe (ZETIA) 10 MG tablet Take 1 tablet (10 mg total) by mouth at bedtime.  90 tablet  3  . fluvastatin XL (LESCOL XL) 80 MG 24 hr tablet Take 80 mg by mouth daily.        . furosemide (LASIX) 40 MG tablet Take 20-40 mg by mouth every other day. Alternates 20 mg 1 day, then 40 mg 1 day, etc.      . isosorbide mononitrate (IMDUR) 120 MG 24 hr tablet Take 1 tablet (120 mg total) by mouth daily.  90 tablet  3  . metoprolol tartrate (LOPRESSOR) 25 MG tablet Take 0.5 tablets (12.5 mg total) by mouth 2 (two) times daily.  90 tablet  3  . Multiple Vitamin (MULITIVITAMIN WITH MINERALS) TABS Take 1 tablet by mouth daily.      . nitroGLYCERIN (NITROSTAT) 0.4 MG SL tablet Place 1  tablet (0.4 mg total) under the tongue every 5 (five) minutes x 3 doses as needed for chest pain.  25 tablet  1  . omega-3 acid ethyl esters (LOVAZA) 1 G capsule Take 1 capsule (1 g total) by mouth daily.  90 capsule  3  . ondansetron (ZOFRAN ODT) 4 MG disintegrating tablet Take 1 tablet (4 mg total) by mouth every 8 (eight) hours as needed for nausea or vomiting.  10 tablet  0  . pantoprazole (PROTONIX) 40 MG tablet Take 1 tablet (40 mg total) by mouth daily.  90 tablet  3  . polyethylene glycol (MIRALAX / GLYCOLAX) packet Take 17 g by mouth daily.        . polyvinyl alcohol (LIQUIFILM TEARS) 1.4 % ophthalmic solution Place 1 drop into both eyes at bedtime.  15 mL  0  . ranolazine (RANEXA) 500 MG 12 hr tablet Take 1 tablet (500 mg total) by mouth 2 (two) times daily.  180 tablet  3  . Rivaroxaban (XARELTO) 15 MG TABS tablet Take 15 mg by mouth daily.       Marland Kitchen TAMIFLU 75 MG capsule       . zolpidem (AMBIEN) 10 MG tablet Take 10 mg by mouth at bedtime.        Marland Kitchen aspirin EC 81 MG EC tablet Take 1 tablet (81 mg total) by mouth daily.      Marland Kitchen diltiazem (CARDIZEM) 60 MG tablet Take 1 tablet (60 mg total) by mouth every 8 (eight) hours.  90 tablet  0   No current facility-administered medications for this visit.    Past Medical History  Diagnosis Date  . Hx of cholecystectomy t  . S/P hysterectomy t  . S/P lumbar fusion t  . Hypertension   . Atrial fib/flutter, transient     2D Echo 07/06/2010  EF of 60 to 65%   . SOB (shortness of breath) 03/14/2011  . CAD (coronary artery disease) 03/14/2011  . PAF (paroxysmal atrial fibrillation) 03/14/2011  . Anticoagulated 03/14/2011  . Infarction splenic 03/14/2011  . Bradycardia 03/14/2011  . GERD (gastroesophageal reflux disease)   . Hiatal hernia   . Heart murmur   . Arthritis   . Thyroid disease   . CVA (cerebral vascular accident)     02/28/2010  . Bleeding 12/12    rectal  . COPD (chronic obstructive pulmonary disease)     on xray  .  Hyperlipidemia, mixed   . Carotid artery disease     Carotid duplex doppler 06/04/11  . Skin benign neoplasm     removed from nose and L cheek- benign     Past Surgical History  Procedure Laterality Date  . Coronary artery bypass graft      CABG x 4  05/28/96  Dr Prescott Gum  (left internal mammary artery to the left anterior descending saphenous vein graft to the diagonal, saphenous vein graft to obtuse marginal, saphenous vein graft to posterior descending  . Bladder suspension    . Cataract extraction w/ intraocular lens  implant, bilateral    . Tee without cardioversion  03/16/2011    Procedure: TRANSESOPHAGEAL ECHOCARDIOGRAM (TEE);  Surgeon: Pixie Casino;  Location: [facility];  Service: Cardiovascular;  Laterality: N/A;  . Colonoscopy  03/17/2011    Procedure: COLONOSCOPY;  Surgeon: Missy Sabins, MD;  Location: [facility];  Service: Endoscopy;  Laterality: N/A;  . Cholecystectomy    . Back surgery    . Abdominal hysterectomy  1974  . Eye surgery      both cataracts  . Cardiac catheterization      05/24/1996/Dr Lia Foyer  revealed 3 vessel coronary artery disease,90-95% LAD, 70-80% mid LAD, 70% diagonal, 80% circumflex, 60-7-% circumflex lesions, 60% proximal RCA as well as 50-70, 60% distal RCA, 90% proximal  PDA with normal LV function;  03/10/1999, 12/04/1998, 05/01/2002, 06/18/2003, 08/08/2008, 12/20/11, 07/21/10;     PXT:GGYIRSW:+ bronchitis, no PNA, no weight changes Skin:no rashes or ulcers HEENT:no blurred vision, no congestion CV:see HPI PUL:see HPI GI:no diarrhea constipation or melena, no indigestion GU:no hematuria, no dysuria MS:no joint pain, no claudication Neuro:no syncope, no lightheadedness Endo:no diabetes, no thyroid disease  PHYSICAL EXAM BP 168/80  Pulse 92  Ht 5\' 2"  (1.575 m)  Wt 111 lb (50.349 kg)  BMI 20.30 kg/m2 General:Pleasant affect, NAD Skin:Warm and dry, brisk capillary refill HEENT:normocephalic, sclera clear, mucus membranes  moist Neck:supple, no JVD, soft rt carotid bruit none on left  Heart:irreg irreg without murmur, gallup, rub or click Lungs:clear without rales, rhonchi, or wheezes NIO:EVOJ, non tender, + BS, do not palpate liver spleen or masses Ext:no lower ext edema, 2+ pedal pulses, 2+ radial pulses Neuro:alert and oriented, MAE, follows commands, + facial symmetry  JKK:XFGHWE fib rate of 90 with PVC, no acute changes from previous tracing.  ASSESSMENT AND PLAN CAD, CABG 1998, cath March 2012- medical Rx., cath 12/2011 medical therapy occ chest pain, She will take an extra 60 mg of imdur if this occurs in the pm.   no SOB  Chronic atrial fibrillation - AA thrombus Rate up slightly at 90   Anticoagulated, Xarelto No bleeding  HTN (hypertension) BP elevated, increased cardizem to every 8 hours.  Will need to monitor at visits for Bradycardia.  Hyperlipidemia Improved.  Bronchitis improving

## 2013-07-04 ENCOUNTER — Other Ambulatory Visit: Payer: Self-pay

## 2013-07-04 MED ORDER — DILTIAZEM HCL 60 MG PO TABS: 60.0000 mg | ORAL_TABLET | Freq: Three times a day (TID) | ORAL | Status: AC

## 2013-07-04 NOTE — Telephone Encounter (Signed)
Rx was sent to pharmacy electronically. 

## 2013-07-06 ENCOUNTER — Ambulatory Visit: Payer: Medicare Other | Admitting: Cardiology

## 2013-07-06 DIAGNOSIS — I10 Essential (primary) hypertension: Secondary | ICD-10-CM | POA: Insufficient documentation

## 2013-07-06 DIAGNOSIS — J40 Bronchitis, not specified as acute or chronic: Secondary | ICD-10-CM | POA: Insufficient documentation

## 2013-07-06 NOTE — Assessment & Plan Note (Signed)
No bleeding

## 2013-07-06 NOTE — Assessment & Plan Note (Signed)
Improved

## 2013-07-06 NOTE — Assessment & Plan Note (Addendum)
occ chest pain, She will take an extra 60 mg of imdur if this occurs in the pm.   no SOB

## 2013-07-06 NOTE — Assessment & Plan Note (Signed)
improving

## 2013-07-06 NOTE — Assessment & Plan Note (Signed)
Rate up slightly at 90

## 2013-07-06 NOTE — Assessment & Plan Note (Signed)
BP elevated, increased cardizem to every 8 hours.  Will need to monitor at visits for Bradycardia.

## 2013-07-31 ENCOUNTER — Telehealth: Payer: Self-pay | Admitting: Cardiovascular Disease

## 2013-07-31 NOTE — Telephone Encounter (Signed)
She wants to know if it will be all right for her to go to [place] to see her grandson? They will be driving,she will drive and rest and walk around. She is feeling so much better.

## 2013-07-31 NOTE — Telephone Encounter (Signed)
Please advise 

## 2013-07-31 NOTE — Telephone Encounter (Signed)
Message forwarded to Kathryn, RN to discuss w/ Dr. Berry.  

## 2013-08-02 NOTE — Telephone Encounter (Signed)
Patient notified

## 2013-08-02 NOTE — Telephone Encounter (Signed)
Yes, please go to [place]

## 2013-08-07 ENCOUNTER — Telehealth: Payer: Self-pay | Admitting: Cardiovascular Disease

## 2013-08-07 MED ORDER — ATORVASTATIN CALCIUM 40 MG PO TABS: 40.0000 mg | ORAL_TABLET | Freq: Every day | ORAL | Status: AC

## 2013-08-07 MED ORDER — ATORVASTATIN CALCIUM 40 MG PO TABS: 40.0000 mg | ORAL_TABLET | Freq: Every day | ORAL | Status: DC

## 2013-08-07 NOTE — Telephone Encounter (Signed)
Returned call and informed pt per instructions by PharmD.  Pt verbalized understanding and agreed w/ plan.  Pt would like 14-day supply sent to [facility] and 90-day supply sent to [facility].  Informed scripts will be sent.  Pt verbalized understanding and agreed w/ plan.  Rx sent to pharmacies.

## 2013-08-07 NOTE — Telephone Encounter (Signed)
Please advise patient.  

## 2013-08-07 NOTE — Telephone Encounter (Signed)
Her insurance is no longer going to pay for her Martie Round wants to know what is she going to be able to take?

## 2013-08-07 NOTE — Telephone Encounter (Signed)
Cathy Ortega   Switch him to atorvastatin 40mg  qd.  [name]

## 2013-08-07 NOTE — Telephone Encounter (Signed)
Returned call and pt verified x 2.  Pt informed message received and Curt Bears will be notified to discuss w/ Dr. Gwenlyn Found if PA needed or switch to something else.  Pt verbalized understanding and agreed w/ plan.   Message forwarded to Curt Bears, RN to discuss w/ Dr. Gwenlyn Found.

## 2013-08-07 NOTE — Telephone Encounter (Signed)
Any advice?

## 2013-08-13 ENCOUNTER — Ambulatory Visit (HOSPITAL_COMMUNITY)
Admission: RE | Admit: 2013-08-13 | Discharge: 2013-08-13 | Disposition: A | Discharge status: Home / Self Care | Payer: Medicare Other | Source: Ambulatory Visit | Attending: Family Medicine | Admitting: Family Medicine

## 2013-08-13 ENCOUNTER — Other Ambulatory Visit (HOSPITAL_COMMUNITY): Payer: Self-pay | Admitting: Family Medicine

## 2013-08-13 DIAGNOSIS — R079 Chest pain, unspecified: Secondary | ICD-10-CM

## 2013-08-13 DIAGNOSIS — X58XXXA Exposure to other specified factors, initial encounter: Secondary | ICD-10-CM | POA: Insufficient documentation

## 2013-08-13 DIAGNOSIS — S2239XA Fracture of one rib, unspecified side, initial encounter for closed fracture: Secondary | ICD-10-CM | POA: Insufficient documentation

## 2013-08-13 DIAGNOSIS — Y929 Unspecified place or not applicable: Secondary | ICD-10-CM | POA: Insufficient documentation

## 2013-09-06 ENCOUNTER — Telehealth: Payer: Self-pay | Admitting: Cardiovascular Disease

## 2013-09-06 NOTE — Telephone Encounter (Signed)
Deferred to Dr. Gwenlyn Found as Juluis Rainier.

## 2013-09-06 NOTE — Telephone Encounter (Signed)
She wanted you to know pt have had a massive stroke. She is hospitalized in [place].She said you could call her if you wanted to.

## 2013-09-10 DEATH — deceased

## 2013-09-17 ENCOUNTER — Telehealth: Payer: Self-pay

## 2013-09-17 NOTE — Telephone Encounter (Signed)
Patient past away while visiting in [place]

## 2013-10-05 ENCOUNTER — Ambulatory Visit: Payer: Medicare Other | Admitting: Cardiovascular Disease

## 2013-11-06 IMAGING — CR DG CHEST 2V
2 series · 2 of 2 positions shown · non-contrast
Comparison: 07/14/2011

CLINICAL DATA: Shortness of breath.  Chest pressure.

CHEST - 2 VIEW

[w chest pa]
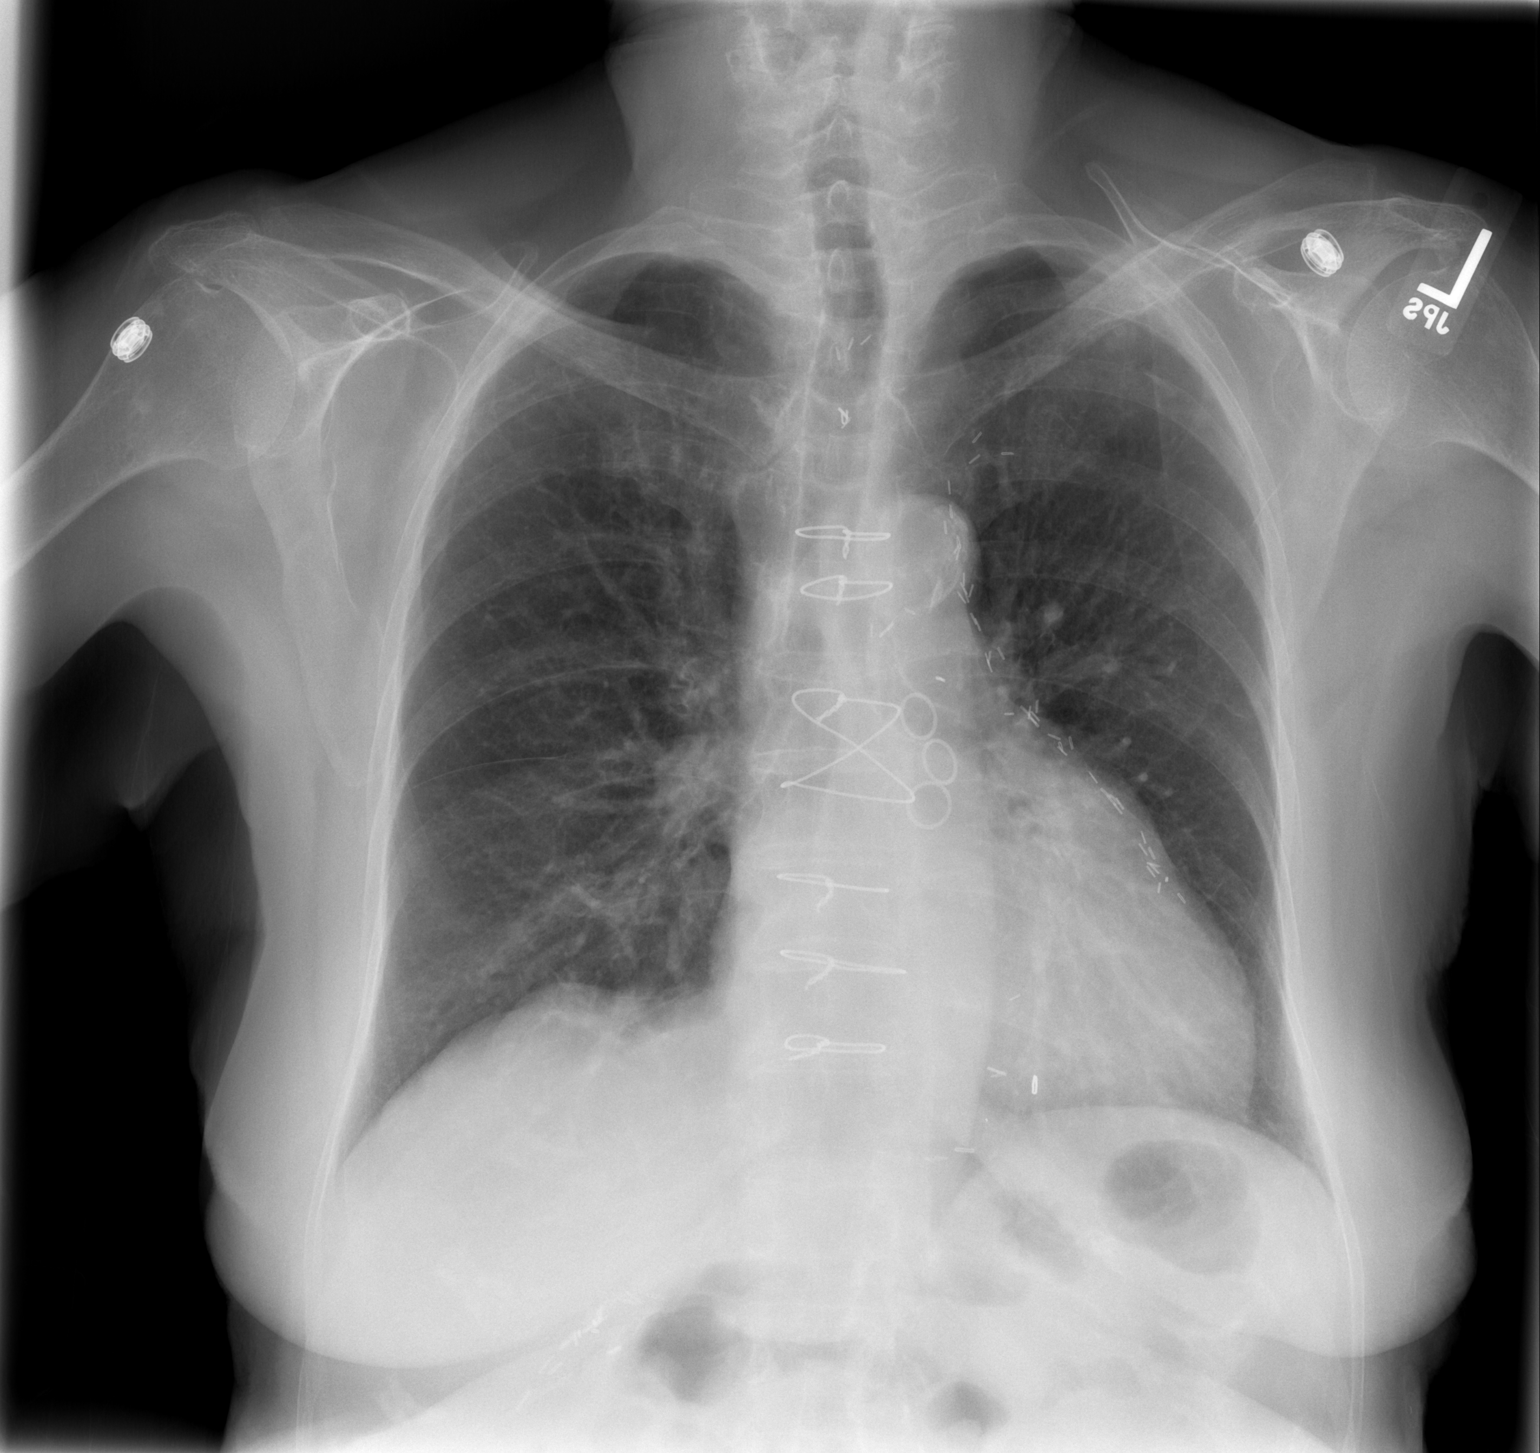

[w chest lat]
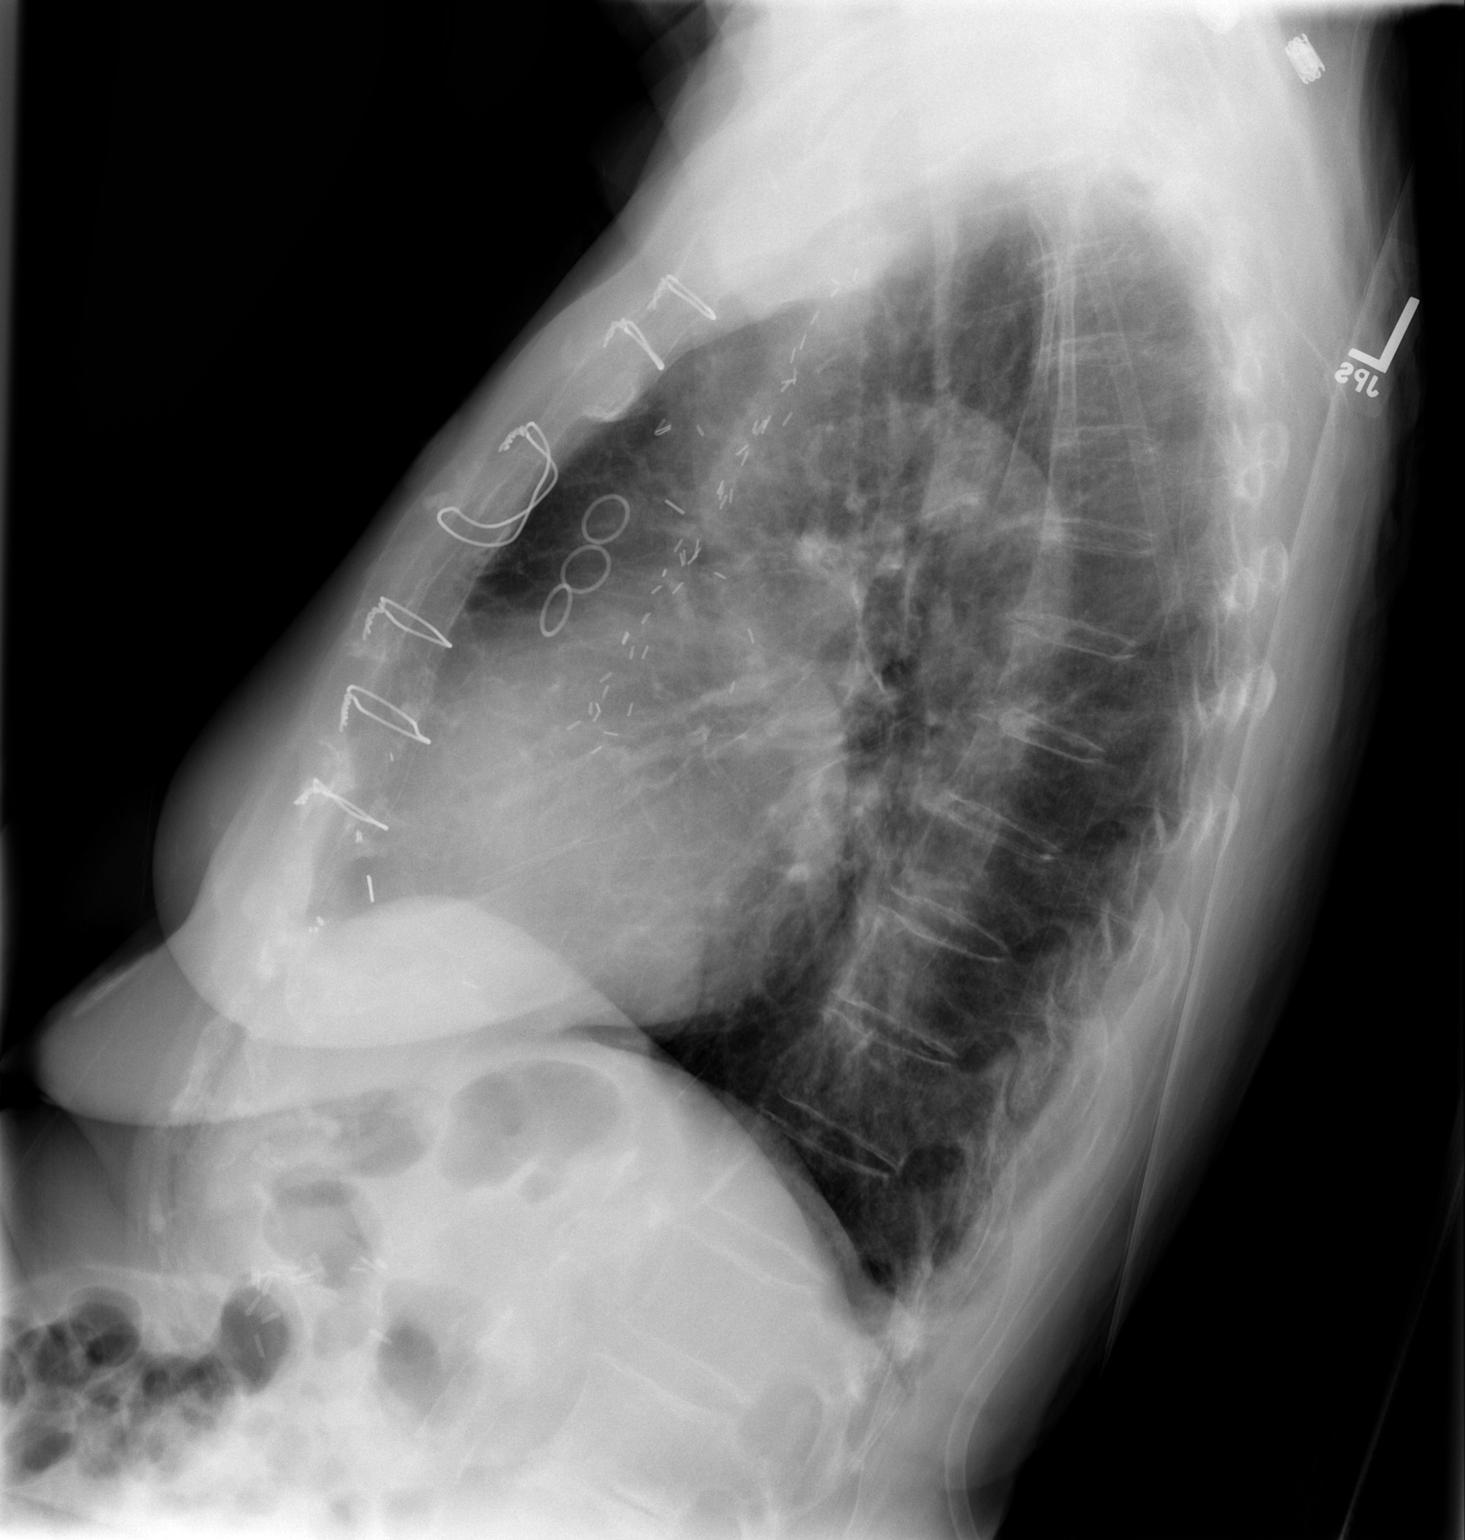

[2 of 2 positions shown; findings below may reference images not displayed]

FINDINGS: The lungs are clear without focal infiltrate, edema,
pneumothorax or pleural effusion. Hyperexpansion is consistent with
emphysema. The cardiopericardial silhouette is enlarged.  The
patient is status post CABG Bones are diffusely demineralized.
IMPRESSION: Stable.  Cardiomegaly with emphysema.  No acute cardiopulmonary
process.

## 2014-03-21 ENCOUNTER — Encounter (HOSPITAL_COMMUNITY): Payer: Self-pay | Admitting: Cardiovascular Disease

## 2014-07-16 IMAGING — CR DG HIP COMPLETE 2+V*R*
3 series · 3 of 3 positions shown · non-contrast
Comparison: 01/24/2012

CLINICAL DATA: Right hip pain

RIGHT HIP - COMPLETE 2+ VIEW

[view not recorded (1 of 3)]
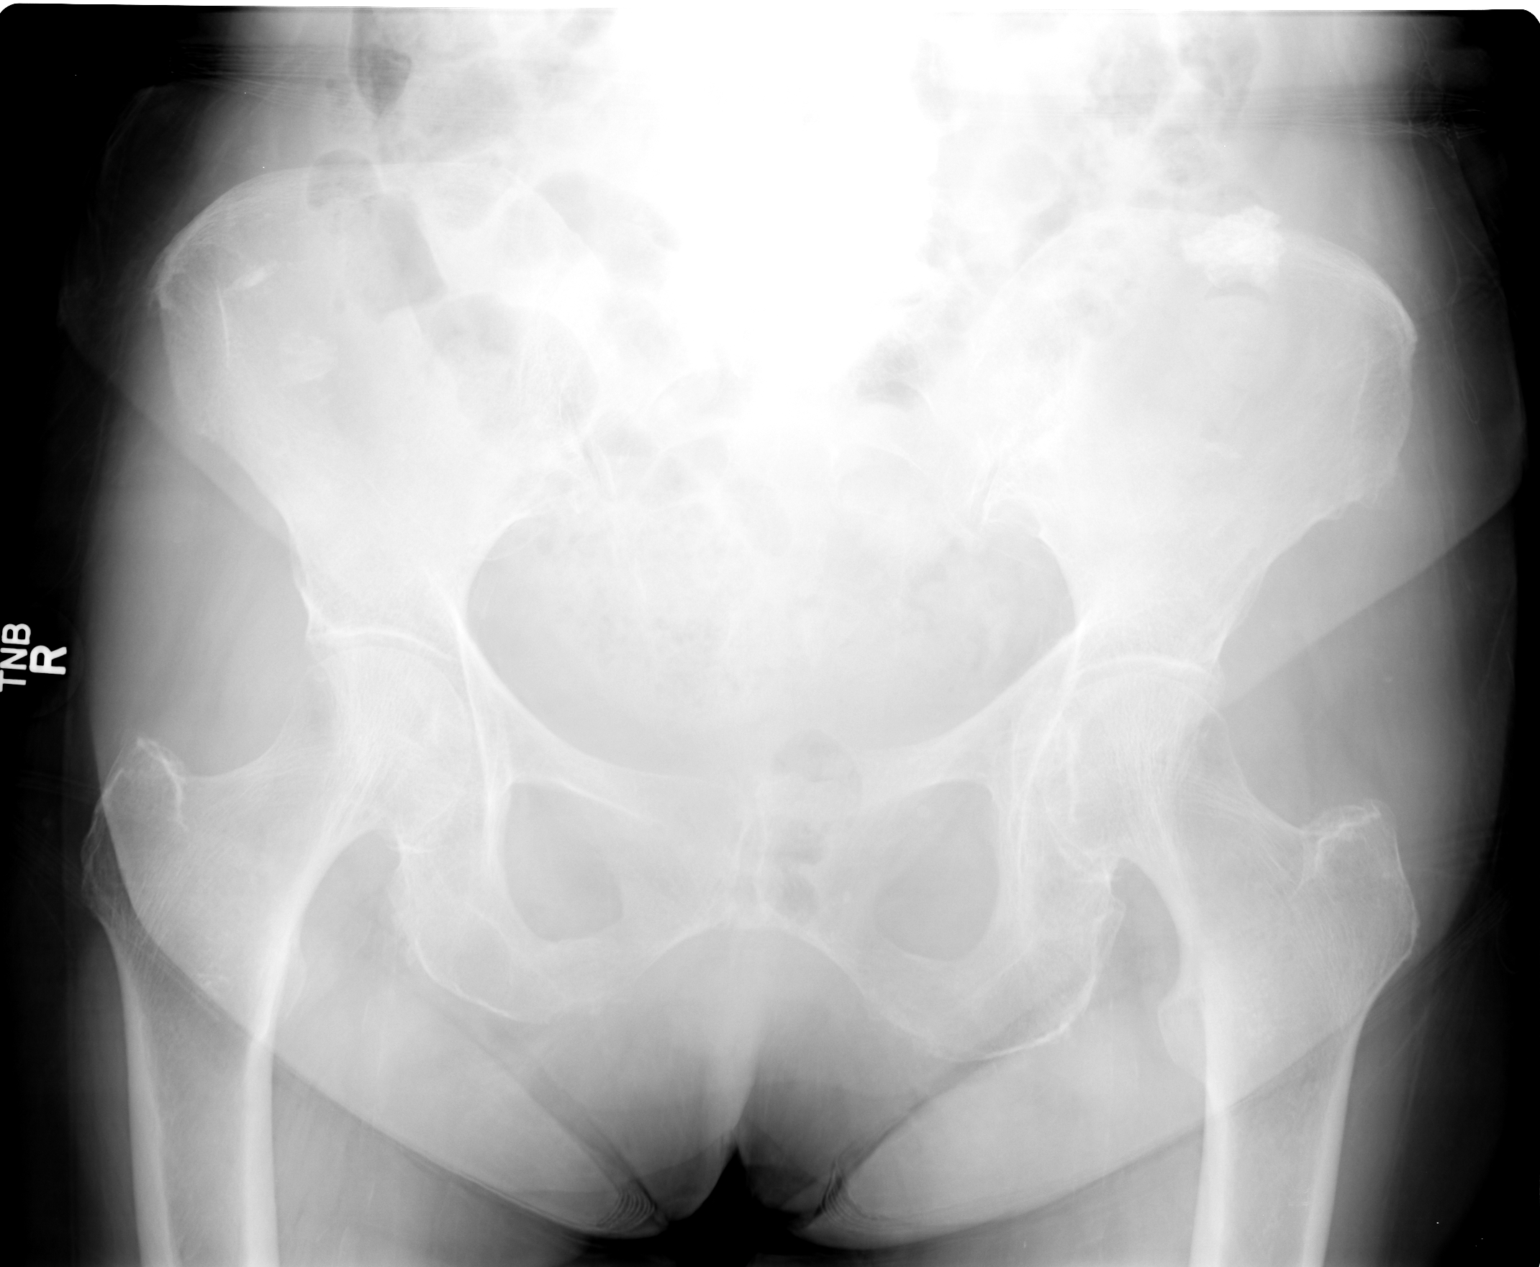

[view not recorded (2 of 3)]
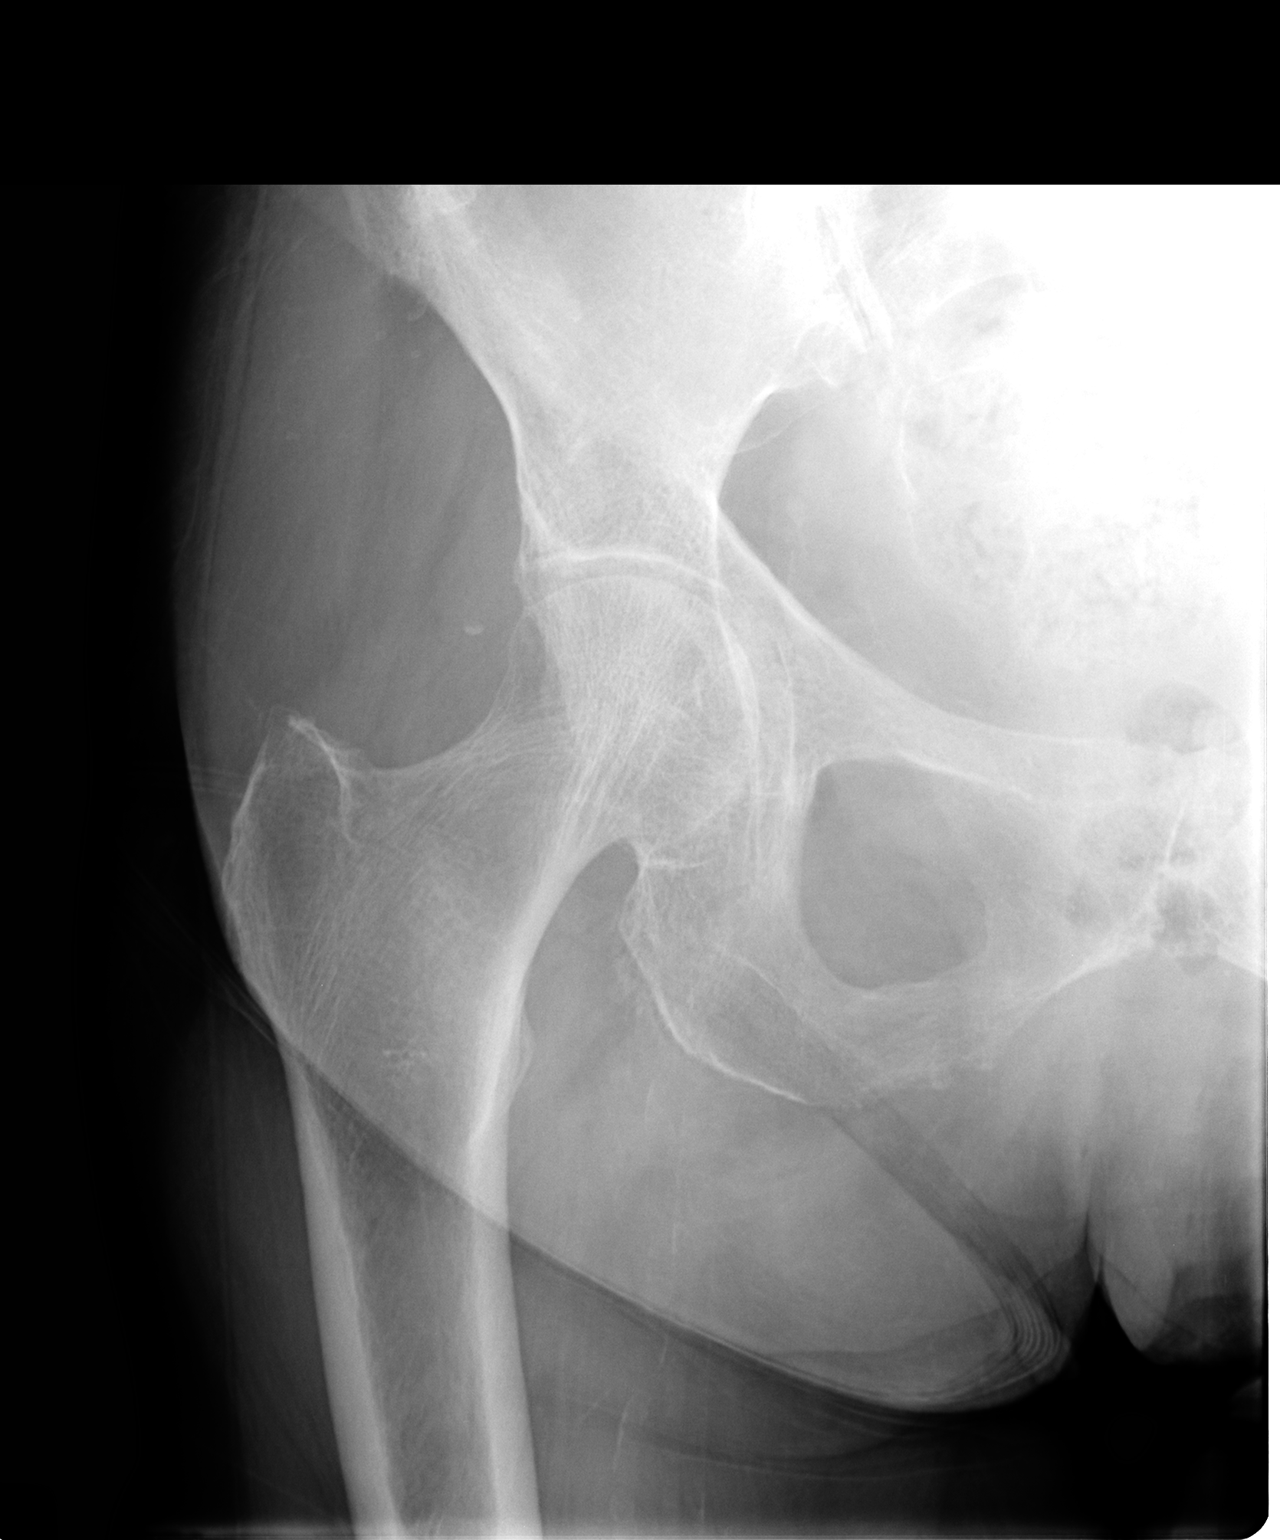

[view not recorded (3 of 3)]
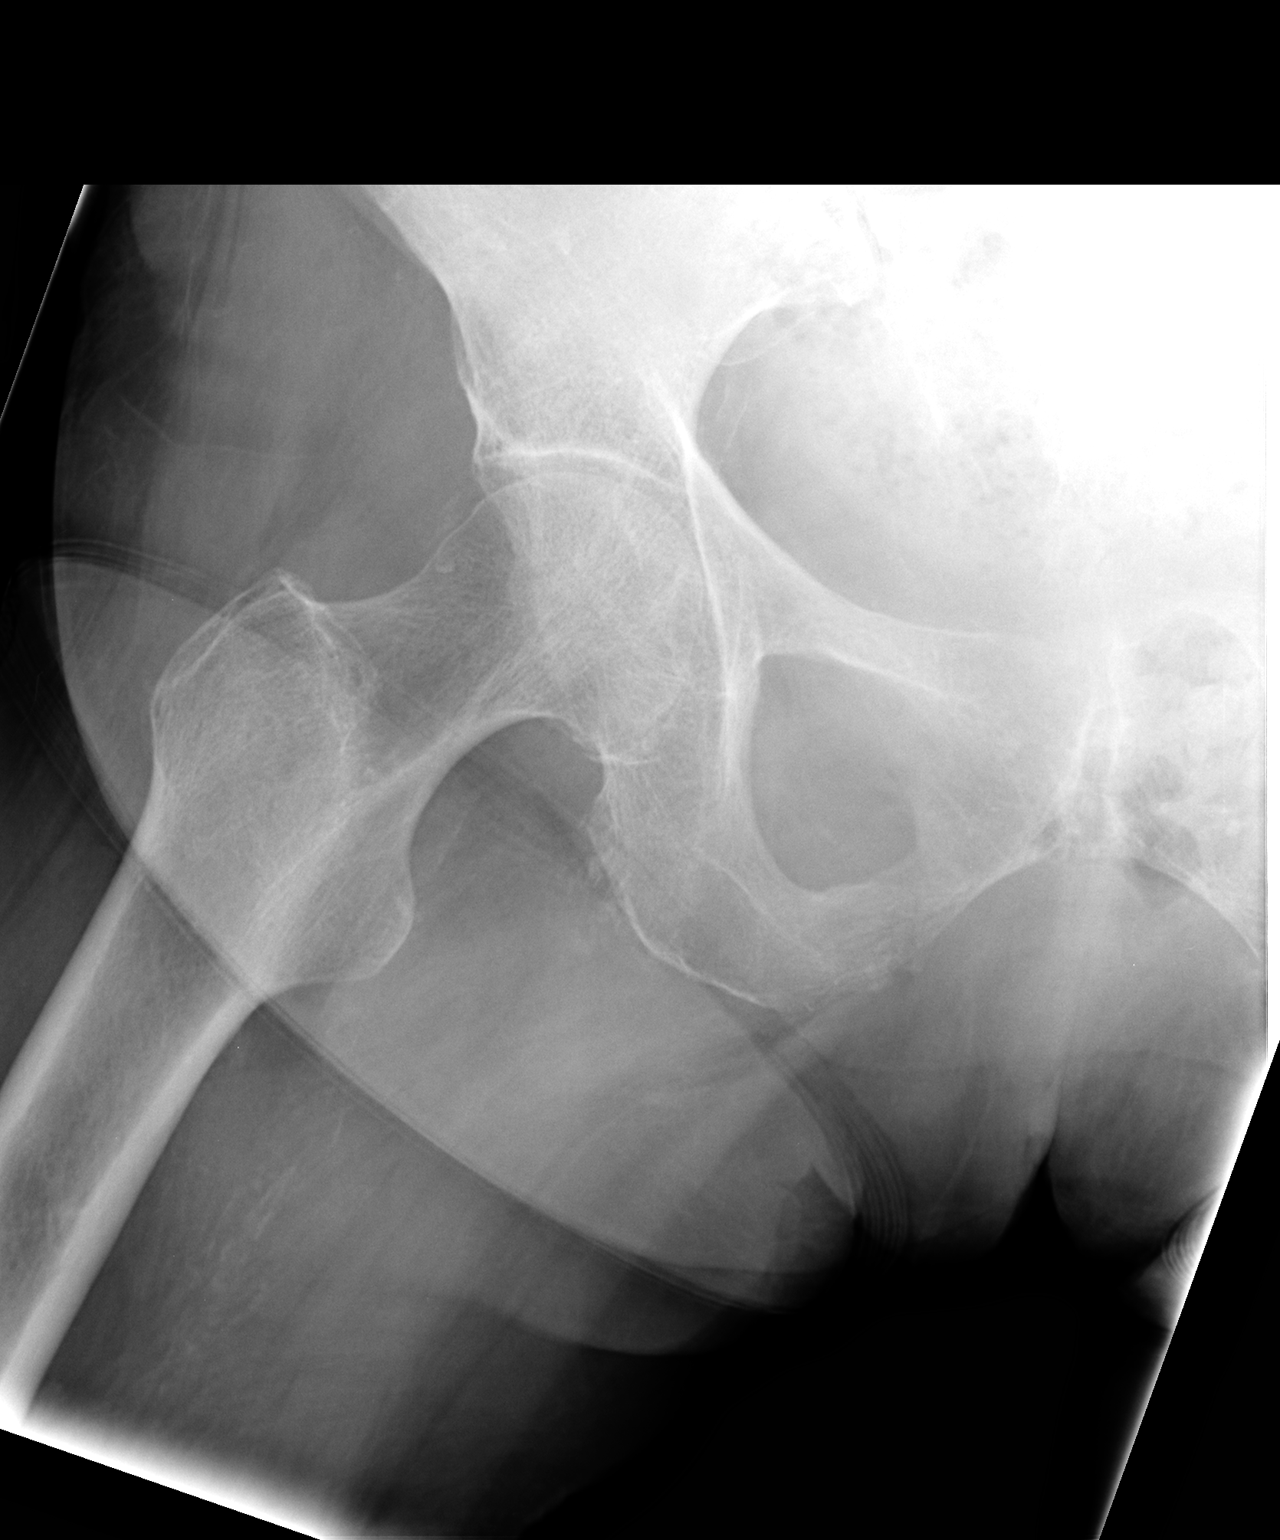

[3 of 3 positions shown; findings below may reference images not displayed]

FINDINGS: Negative for fracture.  Normal alignment and no
significant degenerative change in the right hip joint.  Negative
for AVN.
IMPRESSION: Negative

## 2015-01-19 IMAGING — CR DG CHEST 1V PORT
1 series · 1 of 1 positions shown · non-contrast
Comparison: 10/09/2012

CLINICAL DATA: Short of breath

EXAM:
PORTABLE CHEST - 1 VIEW

[AP]
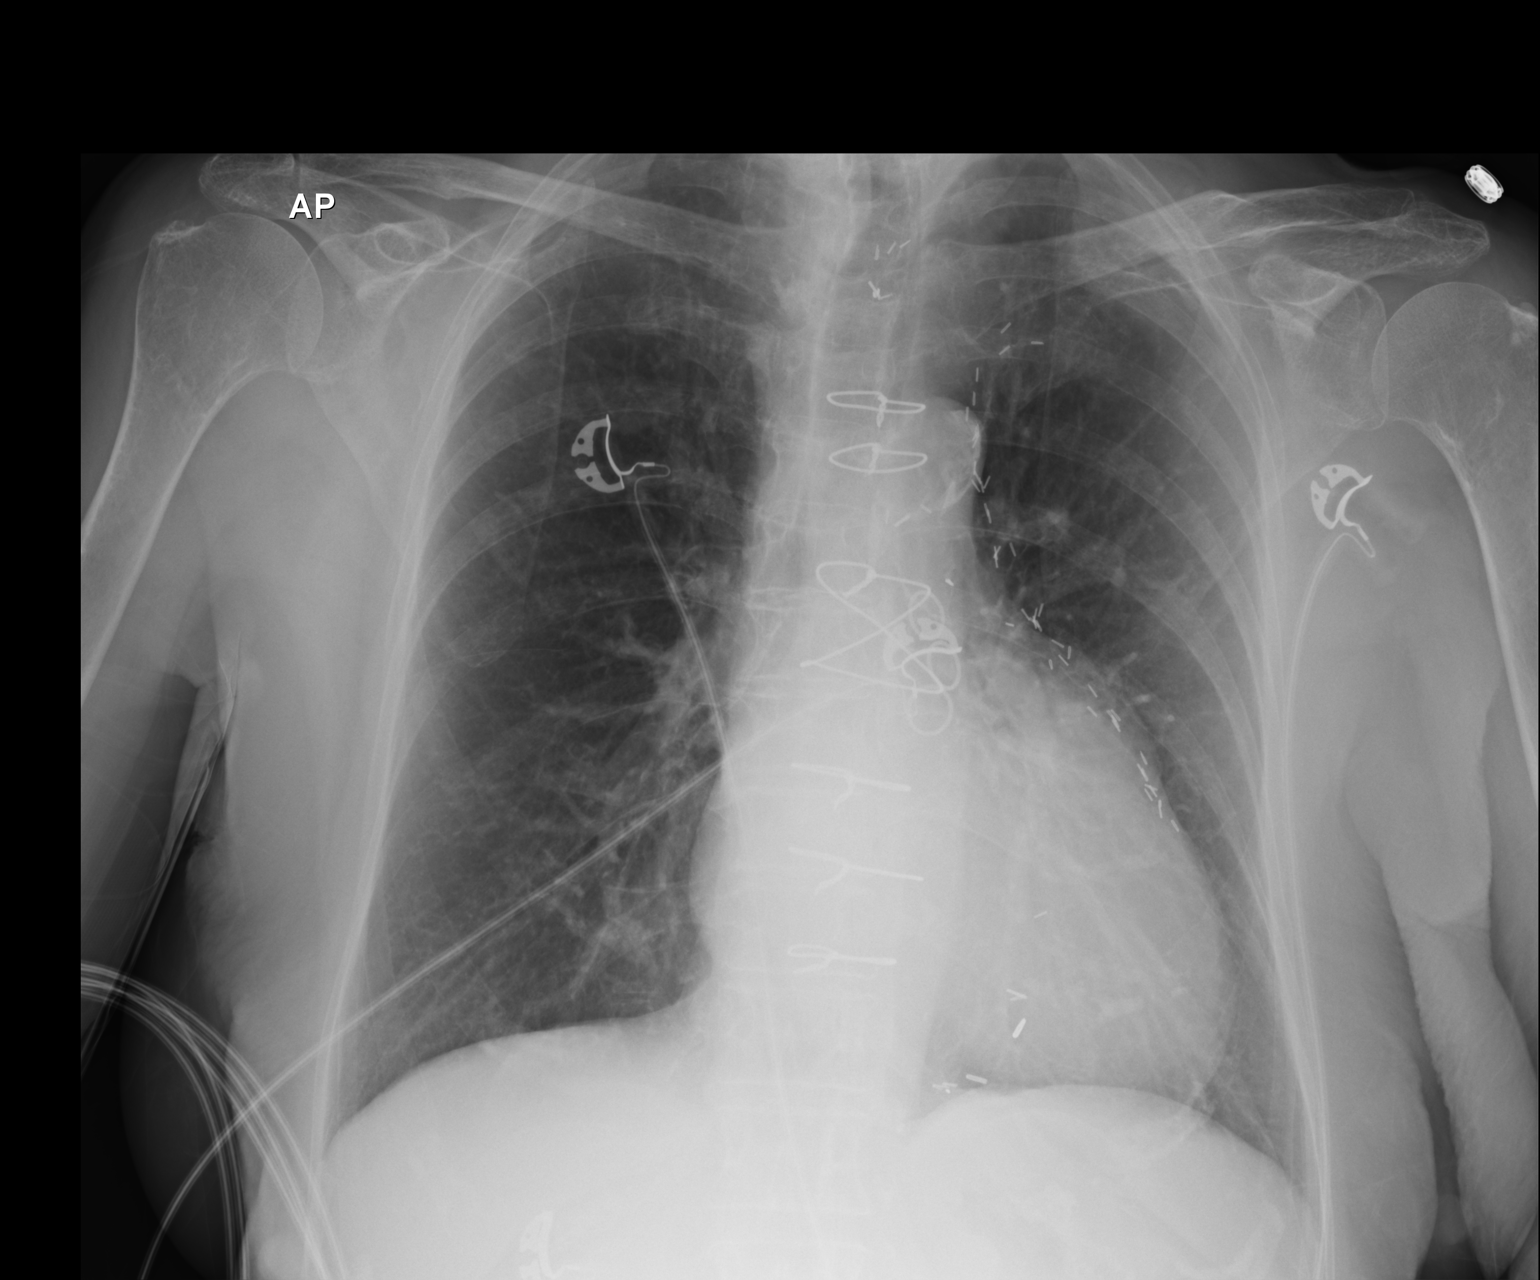

[1 of 1 positions shown; findings below may reference images not displayed]

FINDINGS: Stable enlarged cardiac silhouette. No effusion, infiltrate,
pneumothorax. Lungs are mildly hyperinflated. Midline sternotomy
noted.
IMPRESSION: No acute cardiopulmonary process.
